# Patient Record
Sex: Female | Born: 1970 | Race: White | Hispanic: No | Marital: Married | Smoking: Never smoker
Health system: Southern US, Community
[De-identification: ages and names within clinical notes are randomized; demographics above are authoritative.]

## PROBLEM LIST (undated history)

## (undated) DIAGNOSIS — E559 Vitamin D deficiency, unspecified: Secondary | ICD-10-CM

## (undated) DIAGNOSIS — I471 Supraventricular tachycardia, unspecified: Secondary | ICD-10-CM

## (undated) DIAGNOSIS — F32A Depression, unspecified: Secondary | ICD-10-CM

## (undated) DIAGNOSIS — K589 Irritable bowel syndrome without diarrhea: Secondary | ICD-10-CM

## (undated) DIAGNOSIS — J45909 Unspecified asthma, uncomplicated: Secondary | ICD-10-CM

## (undated) DIAGNOSIS — R7303 Prediabetes: Secondary | ICD-10-CM

## (undated) DIAGNOSIS — G4730 Sleep apnea, unspecified: Secondary | ICD-10-CM

## (undated) DIAGNOSIS — F419 Anxiety disorder, unspecified: Secondary | ICD-10-CM

## (undated) DIAGNOSIS — G4700 Insomnia, unspecified: Secondary | ICD-10-CM

## (undated) DIAGNOSIS — F329 Major depressive disorder, single episode, unspecified: Secondary | ICD-10-CM

## (undated) DIAGNOSIS — Z8669 Personal history of other diseases of the nervous system and sense organs: Secondary | ICD-10-CM

## (undated) HISTORY — DX: Major depressive disorder, single episode, unspecified: F32.9

## (undated) HISTORY — DX: Sleep apnea, unspecified: G47.30

## (undated) HISTORY — DX: Supraventricular tachycardia, unspecified: I47.10

## (undated) HISTORY — DX: Unspecified asthma, uncomplicated: J45.909

## (undated) HISTORY — DX: Prediabetes: R73.03

## (undated) HISTORY — DX: Irritable bowel syndrome, unspecified: K58.9

## (undated) HISTORY — DX: Vitamin D deficiency, unspecified: E55.9

## (undated) HISTORY — DX: Personal history of other diseases of the nervous system and sense organs: Z86.69

## (undated) HISTORY — PX: RADIOFREQUENCY ABLATION NERVES: SUR1070

## (undated) HISTORY — DX: Anxiety disorder, unspecified: F41.9

## (undated) HISTORY — DX: Supraventricular tachycardia: I47.1

## (undated) HISTORY — DX: Depression, unspecified: F32.A

## (undated) HISTORY — DX: Insomnia, unspecified: G47.00

---

## 2017-04-01 ENCOUNTER — Encounter: Payer: Self-pay | Admitting: Physician Assistant

## 2017-04-01 ENCOUNTER — Ambulatory Visit (INDEPENDENT_AMBULATORY_CARE_PROVIDER_SITE_OTHER): Admitting: Physician Assistant

## 2017-04-01 DIAGNOSIS — F411 Generalized anxiety disorder: Secondary | ICD-10-CM | POA: Diagnosis not present

## 2017-04-01 DIAGNOSIS — F320 Major depressive disorder, single episode, mild: Secondary | ICD-10-CM | POA: Diagnosis not present

## 2017-04-01 DIAGNOSIS — I471 Supraventricular tachycardia: Secondary | ICD-10-CM | POA: Diagnosis not present

## 2017-04-01 LAB — T4, FREE: Free T4: 0.72 ng/dL (ref 0.60–1.60)

## 2017-04-01 LAB — TSH: TSH: 1.89 u[IU]/mL (ref 0.35–4.50)

## 2017-04-01 MED ORDER — VORTIOXETINE HBR 20 MG PO TABS: 20.0000 mg | ORAL_TABLET | Freq: Every day | ORAL | 3 refills | Status: DC

## 2017-04-01 MED ORDER — CYCLOBENZAPRINE HCL 5 MG PO TABS: 5.0000 mg | ORAL_TABLET | Freq: Every evening | ORAL | 1 refills | Status: DC | PRN

## 2017-04-01 NOTE — Patient Instructions (Signed)
Please use the voucher given to get the Trintellix.  If you have any issue getting coverage, let me know so I can work on this.   The Flexeril is for rare use at night when your muscle spasms/tnesion flare up. If there is any increase in frequency or severity of this, please let me know.   Continue other medications as directed. Follow-up with therapist as scheduled on Monday. You will be contacted by Cardiology for a new patient appointment.   I will call you with lab results and we will discuss follow-up. Otherwise plan on following up in 3-4 weeks.

## 2017-04-01 NOTE — Progress Notes (Signed)
Patient presents to clinic today to establish care.  Chronic Issues: Depression and Anxiety -- Long-standing history, present since age 46 after the death of her brother. Has had three major episodes of significant depression. Is maintained on Abilify and Cymbalta. Endorses well-controlled overall until recently. Notes shift in mood since moving to the area. Is having breakthrough depression and anxiety. Has been researching Trintellix and would like to discuss starting medication. Has previously been on Lexapro, Wellbutrin, Pristiq and Zoloft without improvement in symptoms..  History of SVT -- Diagnosed in 2016 per patient. Was followed by Cardiology prior to moving. Is requesting a specialist in the area. Is currently on Bystolic 5 mg daily. Patient denies chest pain, palpitations, lightheadedness, dizziness, vision changes or frequent headaches.  BP Readings from Last 3 Encounters:  04/01/17 108/70   Migraines -- Frequent until nerve ablation a few years prior. Denies recurrence of migraines since that time. Does occasionally get tension in the posterior neck for which she uses a muscle relaxant on a rare basis.   Health Maintenance: Immunizations -- obtaining records for review. Mammogram -- up-to-date per patient. Will need records.   Past Medical History:  Diagnosis Date  . Anxiety   . Asthma   . Depression   . History of migraine    No issue in 2 years  . SVT (supraventricular tachycardia) (HCC)     Past Surgical History:  Procedure Laterality Date  . RADIOFREQUENCY ABLATION NERVES      No current outpatient prescriptions on file prior to visit.   No current facility-administered medications on file prior to visit.     Allergies  Allergen Reactions  . Penicillins Nausea And Vomiting  . Sulfa Antibiotics Nausea And Vomiting    Family History  Problem Relation Age of Onset  . Hypertension Mother   . Hyperlipidemia Mother   . Suicidality Brother   . Angina  Paternal Grandmother     Social History   Social History  . Marital status: Married    Spouse name: N/A  . Number of children: N/A  . Years of education: N/A   Occupational History  . Not on file.   Social History Main Topics  . Smoking status: Never Smoker  . Smokeless tobacco: Never Used  . Alcohol use No  . Drug use: No  . Sexual activity: Yes   Other Topics Concern  . Not on file   Social History Narrative  . No narrative on file    Review of Systems  Constitutional: Negative for fever and weight loss.  HENT: Negative for ear discharge, ear pain, hearing loss and tinnitus.   Eyes: Negative for blurred vision, double vision, photophobia and pain.  Respiratory: Negative for cough and shortness of breath.   Cardiovascular: Negative for chest pain and palpitations.  Gastrointestinal: Negative for abdominal pain, blood in stool, constipation, diarrhea, heartburn, melena, nausea and vomiting.  Genitourinary: Negative for dysuria, flank pain, frequency, hematuria and urgency.  Musculoskeletal: Positive for neck pain. Negative for falls.  Neurological: Negative for dizziness, loss of consciousness and headaches.  Endo/Heme/Allergies: Negative for environmental allergies.  Psychiatric/Behavioral: Positive for depression. Negative for hallucinations, substance abuse and suicidal ideas. The patient is nervous/anxious. The patient does not have insomnia.    BP 108/70   Pulse 76   Temp 98.5 F (36.9 C) (Oral)   Resp 14   Ht 5\' 5"  (1.651 m)   Wt 183 lb (83 kg)   SpO2 98%   BMI 30.45 kg/m  Physical Exam  Constitutional: She is oriented to person, place, and time and well-developed, well-nourished, and in no distress.  HENT:  Head: Normocephalic and atraumatic.  Right Ear: External ear normal.  Left Ear: External ear normal.  Nose: Nose normal.  Mouth/Throat: Oropharynx is clear and moist. No oropharyngeal exudate.  TM within normal limits bilaterally.  Eyes:  Conjunctivae are normal.  Neck: Neck supple. No thyromegaly present.  Cardiovascular: Normal rate, regular rhythm, normal heart sounds and intact distal pulses.   Pulmonary/Chest: Effort normal and breath sounds normal. No respiratory distress. She has no wheezes. She has no rales. She exhibits no tenderness.  Lymphadenopathy:    She has no cervical adenopathy.  Neurological: She is alert and oriented to person, place, and time.  Skin: Skin is warm and dry. No rash noted.  Psychiatric: Affect normal.  Vitals reviewed.  Recent Results (from the past 2160 hour(s))  TSH     Status: None   Collection Time: 04/01/17  2:29 PM  Result Value Ref Range   TSH 1.89 0.35 - 4.50 uIU/mL  T4, free     Status: None   Collection Time: 04/01/17  2:29 PM  Result Value Ref Range   Free T4 0.72 0.60 - 1.60 ng/dL    Comment: Specimens from patients who are undergoing biotin therapy and /or ingesting biotin supplements may contain high levels of biotin.  The higher biotin concentration in these specimens interferes with this Free T4 assay.  Specimens that contain high levels  of biotin may cause false high results for this Free T4 assay.  Please interpret results in light of the total clinical presentation of the patient.     Assessment/Plan: SVT (supraventricular tachycardia) (HCC) Vitals stable. Asymptomatic. Continue Bystolic. Referral to Cardiology placed.  Generalized anxiety disorder Will cross-taper down on Cymbalta while tapering up on Trintellix. Will start with decrease in cymbalta to 60 daily while starting 10 of Trintellix. Will assess patient via phone in 1 week to taper further. Follow-up 3-4 weeks. Alarms signs/symptoms reviewed with patient that would prompt ER assessment.   Depression, major, single episode, mild (HCC) Labs today. Will cross-taper down on Cymbalta while tapering up on Trintellix. Will start with decrease in cymbalta to 60 daily while starting 10 of Trintellix. Will assess  patient via phone in 1 week to taper further. Follow-up 3-4 weeks. Alarms signs/symptoms reviewed with patient that would prompt ER assessment.      Piedad ClimesMartin, Emauri Krygier Cody, PA-C

## 2017-04-01 NOTE — Progress Notes (Signed)
Pre visit review using our clinic review tool, if applicable. No additional management support is needed unless otherwise documented below in the visit note. 

## 2017-04-05 NOTE — Assessment & Plan Note (Signed)
Vitals stable. Asymptomatic. Continue Bystolic. Referral to Cardiology placed.

## 2017-04-05 NOTE — Assessment & Plan Note (Signed)
Will cross-taper down on Cymbalta while tapering up on Trintellix. Will start with decrease in cymbalta to 60 daily while starting 10 of Trintellix. Will assess patient via phone in 1 week to taper further. Follow-up 3-4 weeks. Alarms signs/symptoms reviewed with patient that would prompt ER assessment.

## 2017-04-05 NOTE — Assessment & Plan Note (Addendum)
Labs today. Will cross-taper down on Cymbalta while tapering up on Trintellix. Will start with decrease in cymbalta to 60 daily while starting 10 of Trintellix. Will assess patient via phone in 1 week to taper further. Follow-up 3-4 weeks. Alarms signs/symptoms reviewed with patient that would prompt ER assessment.

## 2017-04-08 ENCOUNTER — Telehealth: Payer: Self-pay | Admitting: Physician Assistant

## 2017-04-08 ENCOUNTER — Other Ambulatory Visit: Payer: Self-pay | Admitting: Physician Assistant

## 2017-04-08 MED ORDER — MONTELUKAST SODIUM 10 MG PO TABS: 10.0000 mg | ORAL_TABLET | Freq: Every day | ORAL | 0 refills | Status: DC

## 2017-04-08 MED ORDER — VORTIOXETINE HBR 20 MG PO TABS: 20.0000 mg | ORAL_TABLET | Freq: Every day | ORAL | 1 refills | Status: DC

## 2017-04-08 NOTE — Telephone Encounter (Signed)
Pt asking if KT would call in singlar for her, pt states that her and Selena BattenCody had discussed that if she was not feeling better that he would call this in for her. [facility] in [place]

## 2017-04-08 NOTE — Telephone Encounter (Signed)
Medication filled to pharmacy as requested.  Pt informed.   

## 2017-04-08 NOTE — Telephone Encounter (Signed)
Ok to send Singulair 10mg  daily, #30, no refills

## 2017-04-18 ENCOUNTER — Telehealth: Payer: Self-pay | Admitting: Physician Assistant

## 2017-04-18 ENCOUNTER — Other Ambulatory Visit: Payer: Self-pay | Admitting: Emergency Medicine

## 2017-04-18 MED ORDER — MONTELUKAST SODIUM 10 MG PO TABS: 10.0000 mg | ORAL_TABLET | Freq: Every day | ORAL | 0 refills | Status: DC

## 2017-04-18 NOTE — Telephone Encounter (Signed)
Pt asking for a refill on singulair, pt states that this is working well for her and does not want to run out before next appt with Sanbornody. [facility] in [place]

## 2017-04-18 NOTE — Telephone Encounter (Signed)
Pt called back and states that [facility] is not covered with insurance for refills and asking if [facility]

## 2017-04-18 NOTE — Telephone Encounter (Signed)
[facility] until next appointment. Rx sent on 04/08/17

## 2017-04-18 NOTE — Telephone Encounter (Signed)
Rx for [facility].

## 2017-04-26 ENCOUNTER — Ambulatory Visit: Admitting: Physician Assistant

## 2017-05-02 ENCOUNTER — Encounter: Payer: Self-pay | Admitting: Physician Assistant

## 2017-05-02 ENCOUNTER — Ambulatory Visit (INDEPENDENT_AMBULATORY_CARE_PROVIDER_SITE_OTHER): Admitting: Physician Assistant

## 2017-05-02 DIAGNOSIS — F320 Major depressive disorder, single episode, mild: Secondary | ICD-10-CM | POA: Diagnosis not present

## 2017-05-02 MED ORDER — BUPROPION HCL ER (XL) 150 MG PO TB24: 150.0000 mg | ORAL_TABLET | Freq: Every day | ORAL | 1 refills | Status: DC

## 2017-05-02 MED ORDER — VORTIOXETINE HBR 10 MG PO TABS: 10.0000 mg | ORAL_TABLET | Freq: Every day | ORAL | 1 refills | Status: DC

## 2017-05-02 MED ORDER — VORTIOXETINE HBR 20 MG PO TABS: 20.0000 mg | ORAL_TABLET | Freq: Every day | ORAL | 1 refills | Status: DC

## 2017-05-02 MED ORDER — VORTIOXETINE HBR 20 MG PO TABS: 10.0000 mg | ORAL_TABLET | Freq: Every day | ORAL | 1 refills | Status: DC

## 2017-05-02 NOTE — Progress Notes (Signed)
Patient presents to clinic today for follow-up of anxiety and depression after changing Cymbalta for Trintellix. Has continued 2 mg of Abilify daily. Endorses doing very well overall with this change. Notes improvement in mood. Has noted increased appetite with weight gain despite following a ketogenic diet and exercise regimen. Thyroid testing within normal limits at last visit. Would like to discuss addition of Wellbutrin or Contrave for weight loss.  Past Medical History:  Diagnosis Date  . Anxiety   . Asthma   . Depression   . History of migraine    No issue in 2 years  . SVT (supraventricular tachycardia) (HCC)     Current Outpatient Prescriptions on File Prior to Visit  Medication Sig Dispense Refill  . ARIPiprazole (ABILIFY) 2 MG tablet Take 2 mg by mouth daily.    . cyclobenzaprine (FLEXERIL) 5 MG tablet Take 1 tablet (5 mg total) by mouth at bedtime as needed for muscle spasms. 30 tablet 1  . diazepam (VALIUM) 5 MG tablet Take 5 mg by mouth every 12 (twelve) hours as needed for anxiety.    . nebivolol (BYSTOLIC) 5 MG tablet Take 5 mg by mouth daily.    . montelukast (SINGULAIR) 10 MG tablet Take 1 tablet (10 mg total) by mouth at bedtime. (Patient not taking: Reported on 05/02/2017) 90 tablet 0   No current facility-administered medications on file prior to visit.     Allergies  Allergen Reactions  . Penicillins Nausea And Vomiting  . Sulfa Antibiotics Nausea And Vomiting    Family History  Problem Relation Age of Onset  . Hypertension Mother   . Hyperlipidemia Mother   . Suicidality Brother   . Angina Paternal Grandmother     Social History   Social History  . Marital status: Married    Spouse name: N/A  . Number of children: N/A  . Years of education: N/A   Social History Main Topics  . Smoking status: Never Smoker  . Smokeless tobacco: Never Used  . Alcohol use No  . Drug use: No  . Sexual activity: Yes   Other Topics Concern  . None   Social  History Narrative  . None    Review of Systems - See HPI.  All other ROS are negative.  BP 110/70   Pulse 67   Temp 98.2 F (36.8 C) (Oral)   Resp 14   Ht 5\' 5"  (1.651 m)   Wt 184 lb (83.5 kg)   SpO2 98%   BMI 30.62 kg/m   Physical Exam  Constitutional: She is oriented to person, place, and time and well-developed, well-nourished, and in no distress.  HENT:  Head: Normocephalic and atraumatic.  Eyes: Conjunctivae are normal.  Neck: Neck supple.  Cardiovascular: Normal rate, regular rhythm, normal heart sounds and intact distal pulses.   Pulmonary/Chest: Effort normal and breath sounds normal. No respiratory distress. She has no wheezes. She has no rales. She exhibits no tenderness.  Neurological: She is alert and oriented to person, place, and time.  Skin: Skin is warm and dry. No rash noted.  Psychiatric: Affect normal.  Vitals reviewed.   Recent Results (from the past 2160 hour(s))  TSH     Status: None   Collection Time: 04/01/17  2:29 PM  Result Value Ref Range   TSH 1.89 0.35 - 4.50 uIU/mL  T4, free     Status: None   Collection Time: 04/01/17  2:29 PM  Result Value Ref Range   Free T4  0.72 0.60 - 1.60 ng/dL    Comment: Specimens from patients who are undergoing biotin therapy and /or ingesting biotin supplements may contain high levels of biotin.  The higher biotin concentration in these specimens interferes with this Free T4 assay.  Specimens that contain high levels  of biotin may cause false high results for this Free T4 assay.  Please interpret results in light of the total clinical presentation of the patient.      Assessment/Plan: Depression, major, single episode, mild (HCC) Will continue Abilify at current dose. Symptoms significantly improved with change to Trintellix. Will add on Wellbutrin XL at 150 mg to help with weight and further help with mood. Will decrease Trintellix to 10 mg since adding this. Continue diet and exercise. Follow-up 1  month.    Piedad Climes, PA-C

## 2017-05-02 NOTE — Patient Instructions (Signed)
Please start Wellbutrin as directed. Decrease the Trintellix to 1/2 tablet (10 mg). I will send in an Rx of the new dose.  Continue Abilify.  Follow-up with me in 3 weeks for repeat assessment. If you note any worsening mood, sweating, racing heart, stop the Wellbutrin and come see me.

## 2017-05-02 NOTE — Progress Notes (Signed)
Pre visit review using our clinic review tool, if applicable. No additional management support is needed unless otherwise documented below in the visit note. 

## 2017-05-02 NOTE — Assessment & Plan Note (Signed)
Will continue Abilify at current dose. Symptoms significantly improved with change to Trintellix. Will add on Wellbutrin XL at 150 mg to help with weight and further help with mood. Will decrease Trintellix to 10 mg since adding this. Continue diet and exercise. Follow-up 1 month.

## 2017-05-10 ENCOUNTER — Encounter: Payer: Self-pay | Admitting: Physician Assistant

## 2017-05-10 ENCOUNTER — Ambulatory Visit (INDEPENDENT_AMBULATORY_CARE_PROVIDER_SITE_OTHER): Admitting: Physician Assistant

## 2017-05-10 ENCOUNTER — Telehealth: Payer: Self-pay | Admitting: Physician Assistant

## 2017-05-10 VITALS — BP 90/60 | HR 69 | Temp 98.3°F | Resp 14 | Ht 65.0 in | Wt 184.0 lb

## 2017-05-10 DIAGNOSIS — F320 Major depressive disorder, single episode, mild: Secondary | ICD-10-CM | POA: Diagnosis not present

## 2017-05-10 NOTE — Progress Notes (Signed)
Patient presents to clinic today for follow-up of depression. At last visit, trintellix was decreased from 20 mg to 10 mg daily due to side effect. Wellbutrin 150 mg daily was started. Patient is taking as directed. Is tolerating well. Endorses increased appetite and hunger along with nausea have all revolved. Would like to discuss increase in Wellbutrin since tolerating well to help further with mood. States the mood is still not where it was with the 20 mg Trintellix. Denies SI/HI.  Past Medical History:  Diagnosis Date  . Anxiety   . Asthma   . Depression   . History of migraine    No issue in 2 years  . SVT (supraventricular tachycardia) (HCC)     Current Outpatient Prescriptions on File Prior to Visit  Medication Sig Dispense Refill  . ARIPiprazole (ABILIFY) 2 MG tablet Take 2 mg by mouth daily.    Marland Kitchen buPROPion (WELLBUTRIN XL) 150 MG 24 hr tablet Take 1 tablet (150 mg total) by mouth daily. 30 tablet 1  . cyclobenzaprine (FLEXERIL) 5 MG tablet Take 1 tablet (5 mg total) by mouth at bedtime as needed for muscle spasms. 30 tablet 1  . diazepam (VALIUM) 5 MG tablet Take 5 mg by mouth every 12 (twelve) hours as needed for anxiety.    . montelukast (SINGULAIR) 10 MG tablet Take 1 tablet (10 mg total) by mouth at bedtime. 90 tablet 0  . nebivolol (BYSTOLIC) 5 MG tablet Take 5 mg by mouth daily.    Marland Kitchen vortioxetine HBr (TRINTELLIX) 10 MG TABS Take 1 tablet (10 mg total) by mouth daily. 90 tablet 1  . Melatonin 3 MG TABS Take 1 tablet by mouth at bedtime.     No current facility-administered medications on file prior to visit.     Allergies  Allergen Reactions  . Penicillins Nausea And Vomiting  . Sulfa Antibiotics Nausea And Vomiting    Family History  Problem Relation Age of Onset  . Hypertension Mother   . Hyperlipidemia Mother   . Suicidality Brother   . Angina Paternal Grandmother     Social History   Social History  . Marital status: Married    Spouse name: N/A  .  Number of children: N/A  . Years of education: N/A   Social History Main Topics  . Smoking status: Never Smoker  . Smokeless tobacco: Never Used  . Alcohol use No  . Drug use: No  . Sexual activity: Yes   Other Topics Concern  . None   Social History Narrative  . None    Review of Systems - See HPI.  All other ROS are negative.  BP 90/60   Pulse 69   Temp 98.3 F (36.8 C) (Oral)   Resp 14   Ht 5\' 5"  (1.651 m)   Wt 184 lb (83.5 kg)   SpO2 98%   BMI 30.62 kg/m   Physical Exam  Constitutional: She is oriented to person, place, and time and well-developed, well-nourished, and in no distress.  HENT:  Head: Normocephalic and atraumatic.  Eyes: Conjunctivae are normal.  Neck: Neck supple.  Cardiovascular: Normal rate, regular rhythm, normal heart sounds and intact distal pulses.   Pulmonary/Chest: Effort normal and breath sounds normal. No respiratory distress. She has no wheezes. She has no rales. She exhibits no tenderness.  Neurological: She is alert and oriented to person, place, and time. No cranial nerve deficit.  Skin: Skin is warm and dry. No rash noted.  Psychiatric: Affect normal.  Vitals reviewed.   Recent Results (from the past 2160 hour(s))  TSH     Status: None   Collection Time: 04/01/17  2:29 PM  Result Value Ref Range   TSH 1.89 0.35 - 4.50 uIU/mL  T4, free     Status: None   Collection Time: 04/01/17  2:29 PM  Result Value Ref Range   Free T4 0.72 0.60 - 1.60 ng/dL    Comment: Specimens from patients who are undergoing biotin therapy and /or ingesting biotin supplements may contain high levels of biotin.  The higher biotin concentration in these specimens interferes with this Free T4 assay.  Specimens that contain high levels  of biotin may cause false high results for this Free T4 assay.  Please interpret results in light of the total clinical presentation of the patient.      Assessment/Plan: Depression, major, single episode, mild  (HCC) Continue 10 mg Trintellix. Will increase Wellbutrin to 300 mg once daily as she is tolerating well. Close follow-up scheduled. ER precautions reviewed. Patient voiced understanding and agreement with the plan.     Piedad ClimesMartin, Valeri Sula Cody, PA-C

## 2017-05-10 NOTE — Patient Instructions (Signed)
Please continue Trintellix at 10 mg dose. Increase Wellbutrin to 300 mg once daily. We will follow-up via phone in 1 week. Follow-up in office as scheduled.  If you note any worsening mood on this regimen, come see me.  Any alarm symptoms, thoughts of harming yourself or others, etc, please go to the ER.

## 2017-05-10 NOTE — Telephone Encounter (Signed)
Called pt and advised of PCP recommendations, she made an appt for tomorrow 05/11/17.

## 2017-05-10 NOTE — Assessment & Plan Note (Signed)
Continue 10 mg Trintellix. Will increase Wellbutrin to 300 mg once daily as she is tolerating well. Close follow-up scheduled. ER precautions reviewed. Patient voiced understanding and agreement with the plan.

## 2017-05-10 NOTE — Telephone Encounter (Signed)
Pt asking if she could increase her wellbutrin from 150 mg to 300 mg, please advise

## 2017-05-10 NOTE — Telephone Encounter (Signed)
Would stay at same dose until follow-up appointment. Any worsening of mood, she needs to be seen sooner.

## 2017-05-10 NOTE — Telephone Encounter (Signed)
Please advise? pt was last seen on 05/02/17 for depression.

## 2017-05-10 NOTE — Progress Notes (Signed)
Pre visit review using our clinic review tool, if applicable. No additional management support is needed unless otherwise documented below in the visit note. 

## 2017-05-11 ENCOUNTER — Ambulatory Visit: Admitting: Physician Assistant

## 2017-05-23 ENCOUNTER — Ambulatory Visit (INDEPENDENT_AMBULATORY_CARE_PROVIDER_SITE_OTHER): Admitting: Physician Assistant

## 2017-05-23 ENCOUNTER — Encounter: Payer: Self-pay | Admitting: Physician Assistant

## 2017-05-23 VITALS — BP 100/70 | HR 65 | Temp 98.8°F | Resp 14 | Ht 65.0 in | Wt 185.0 lb

## 2017-05-23 DIAGNOSIS — J3489 Other specified disorders of nose and nasal sinuses: Secondary | ICD-10-CM | POA: Diagnosis not present

## 2017-05-23 DIAGNOSIS — F320 Major depressive disorder, single episode, mild: Secondary | ICD-10-CM

## 2017-05-23 MED ORDER — ARIPIPRAZOLE 2 MG PO TABS: 2.0000 mg | ORAL_TABLET | Freq: Every day | ORAL | 1 refills | Status: DC

## 2017-05-23 MED ORDER — DULOXETINE HCL 60 MG PO CPEP: 60.0000 mg | ORAL_CAPSULE | Freq: Every day | ORAL | 1 refills | Status: DC

## 2017-05-23 MED ORDER — MUPIROCIN 2 % EX OINT: 1.0000 "application " | TOPICAL_OINTMENT | Freq: Two times a day (BID) | CUTANEOUS | 0 refills | Status: DC

## 2017-05-23 MED ORDER — BUPROPION HCL ER (XL) 150 MG PO TB24: 150.0000 mg | ORAL_TABLET | Freq: Every day | ORAL | 1 refills | Status: DC

## 2017-05-23 NOTE — Assessment & Plan Note (Signed)
Discussed avoidance of changing medications without consulting a medical professional. Will stay on current regimen of Abilify, Cymbalta and Wellbutrin. Follow-up scheduled.

## 2017-05-23 NOTE — Progress Notes (Signed)
Patient presents to clinic today for follow-up of anxiety/depression. At last visit patient was kept on 10 mg Trintellix daily and Wellbutrin increased to 300 mg daily. Patient states she took as directed for one week but noted still having significant issue with hunger on the Trintellix. As such she stopped the Trintellix and restarted Cymbalta at 60 mg. Decreased Wellbutrin to 150 mg. Has been taking as directed with resolution in extreme hunger. Notes mood is stable overall and feeling much better. Is working harder on diet and exercise. Notes resting well. Denies SI/HI.  Patient also endorses sore in R nostril over the past couple of weeks. Has a history of this for which she has used Mupirocin ointment with success. Denies epistaxis. Only noted mild tenderness.   Past Medical History:  Diagnosis Date  . Anxiety   . Asthma   . Depression   . History of migraine    No issue in 2 years  . SVT (supraventricular tachycardia) (HCC)     Current Outpatient Prescriptions on File Prior to Visit  Medication Sig Dispense Refill  . cyclobenzaprine (FLEXERIL) 5 MG tablet Take 1 tablet (5 mg total) by mouth at bedtime as needed for muscle spasms. 30 tablet 1  . diazepam (VALIUM) 5 MG tablet Take 5 mg by mouth every 12 (twelve) hours as needed for anxiety.    . nebivolol (BYSTOLIC) 5 MG tablet Take 5 mg by mouth daily.     No current facility-administered medications on file prior to visit.     Allergies  Allergen Reactions  . Penicillins Nausea And Vomiting  . Sulfa Antibiotics Nausea And Vomiting    Family History  Problem Relation Age of Onset  . Hypertension Mother   . Hyperlipidemia Mother   . Suicidality Brother   . Angina Paternal Grandmother     Social History   Social History  . Marital status: Married    Spouse name: N/A  . Number of children: N/A  . Years of education: N/A   Social History Main Topics  . Smoking status: Never Smoker  . Smokeless tobacco: Never Used    . Alcohol use No  . Drug use: No  . Sexual activity: Yes   Other Topics Concern  . None   Social History Narrative  . None   Review of Systems - See HPI.  All other ROS are negative.  BP 100/70   Pulse 65   Temp 98.8 F (37.1 C) (Oral)   Resp 14   Ht 5\' 5"  (1.651 m)   Wt 185 lb (83.9 kg)   SpO2 98%   BMI 30.79 kg/m   Physical Exam  Constitutional: She is oriented to person, place, and time and well-developed, well-nourished, and in no distress.  HENT:  Head: Normocephalic and atraumatic.  Nose:    Eyes: Conjunctivae are normal.  Neck: Neck supple.  Cardiovascular: Normal rate, regular rhythm, normal heart sounds and intact distal pulses.   Pulmonary/Chest: Effort normal and breath sounds normal. No respiratory distress. She has no wheezes. She has no rales. She exhibits no tenderness.  Neurological: She is alert and oriented to person, place, and time.  Skin: Skin is warm and dry. No rash noted.  Psychiatric: Affect normal.  Vitals reviewed.   Recent Results (from the past 2160 hour(s))  TSH     Status: None   Collection Time: 04/01/17  2:29 PM  Result Value Ref Range   TSH 1.89 0.35 - 4.50 uIU/mL  T4, free  Status: None   Collection Time: 04/01/17  2:29 PM  Result Value Ref Range   Free T4 0.72 0.60 - 1.60 ng/dL    Comment: Specimens from patients who are undergoing biotin therapy and /or ingesting biotin supplements may contain high levels of biotin.  The higher biotin concentration in these specimens interferes with this Free T4 assay.  Specimens that contain high levels  of biotin may cause false high results for this Free T4 assay.  Please interpret results in light of the total clinical presentation of the patient.    Dictation #1 [id number]  [id number]  Assessment/Plan: Nasal vestibulitis Rx mupirocin. Supportive measures and OTC medications discussed.  Depression, major, single episode, mild (HCC) Discussed avoidance of changing  medications without consulting a medical professional. Will stay on current regimen of Abilify, Cymbalta and Wellbutrin. Follow-up scheduled.     Piedad ClimesMartin, Mercia Dowe Cody, PA-C

## 2017-05-23 NOTE — Progress Notes (Signed)
Pre visit review using our clinic review tool, if applicable. No additional management support is needed unless otherwise documented below in the visit note. 

## 2017-05-23 NOTE — Patient Instructions (Signed)
Please continue chronic medications as directed. Keep up with diet and exercise.  Start the Mupirocin ointment as directed for 1 week.  Continue with vaseline. Place a humidifier in the bedroom.  Call if symptoms are not resolving. Follow-up with me in 3 months.

## 2017-05-23 NOTE — Assessment & Plan Note (Signed)
Rx mupirocin. Supportive measures and OTC medications discussed.

## 2017-05-24 ENCOUNTER — Encounter: Payer: Self-pay | Admitting: Physician Assistant

## 2017-05-24 ENCOUNTER — Ambulatory Visit (INDEPENDENT_AMBULATORY_CARE_PROVIDER_SITE_OTHER): Admitting: Physician Assistant

## 2017-05-24 DIAGNOSIS — F320 Major depressive disorder, single episode, mild: Secondary | ICD-10-CM | POA: Diagnosis not present

## 2017-05-24 MED ORDER — VILAZODONE HCL 10 MG PO TABS: 10.0000 mg | ORAL_TABLET | Freq: Every day | ORAL | 1 refills | Status: DC

## 2017-05-24 NOTE — Patient Instructions (Signed)
Please stop the Cymbalta and start the Vilazodone (Viibryd) as directed. Continue the Abilify and Wellbutrin. I would like to see you in 2 weeks. Consider the GeneSite testing. I feel this would be highly helpful. If you note any worsening mood with medication changes, please call or return to the office.

## 2017-05-24 NOTE — Assessment & Plan Note (Addendum)
Patient apologizes for withholding things at last visit. I discussed with her that it will be very hard for me to take care of her if she is not comfortable opening up and being honest about symptoms. She voices understanding. Discussed options. Will continue Abilify and Wellbutrin. Cymbalta again d/c'd.  Will add on low-dose Viibryd. Very close follow-up scheduled.

## 2017-05-24 NOTE — Progress Notes (Signed)
Pre visit review using our clinic review tool, if applicable. No additional management support is needed unless otherwise documented below in the visit note. 

## 2017-05-24 NOTE — Progress Notes (Signed)
Patient presents to clinic today c/o for follow-up of depressed mood. Patient was just seen for this issue yesterday at which time she noted doing very well on regimen of Cymbalta, Abilify and Wellbutrin. States that she really was not feeling much better but was hesitant to try other medications due to her history of side effects and ineffectiveness. Would now like to discuss. Denies SI/HI.   Past Medical History:  Diagnosis Date  . Anxiety   . Asthma   . Depression   . History of migraine    No issue in 2 years  . SVT (supraventricular tachycardia) (HCC)     Current Outpatient Prescriptions on File Prior to Visit  Medication Sig Dispense Refill  . ARIPiprazole (ABILIFY) 2 MG tablet Take 1 tablet (2 mg total) by mouth daily. 90 tablet 1  . buPROPion (WELLBUTRIN XL) 150 MG 24 hr tablet Take 1 tablet (150 mg total) by mouth daily. 90 tablet 1  . cyclobenzaprine (FLEXERIL) 5 MG tablet Take 1 tablet (5 mg total) by mouth at bedtime as needed for muscle spasms. 30 tablet 1  . diazepam (VALIUM) 5 MG tablet Take 5 mg by mouth every 12 (twelve) hours as needed for anxiety.    . mupirocin ointment (BACTROBAN) 2 % Place 1 application into the nose 2 (two) times daily. 22 g 0  . nebivolol (BYSTOLIC) 5 MG tablet Take 5 mg by mouth daily.     No current facility-administered medications on file prior to visit.     Allergies  Allergen Reactions  . Penicillins Nausea And Vomiting  . Sulfa Antibiotics Nausea And Vomiting    Family History  Problem Relation Age of Onset  . Hypertension Mother   . Hyperlipidemia Mother   . Suicidality Brother   . Angina Paternal Grandmother     Social History   Social History  . Marital status: Married    Spouse name: N/A  . Number of children: N/A  . Years of education: N/A   Social History Main Topics  . Smoking status: Never Smoker  . Smokeless tobacco: Never Used  . Alcohol use No  . Drug use: No  . Sexual activity: Yes   Other Topics  Concern  . None   Social History Narrative  . None   Review of Systems - See HPI.  All other ROS are negative.  BP 100/62   Pulse 65   Temp 99.2 F (37.3 C) (Oral)   Resp 14   Ht 5\' 5"  (1.651 m)   Wt 184 lb (83.5 kg)   SpO2 98%   BMI 30.62 kg/m   Physical Exam  Constitutional: She is oriented to person, place, and time and well-developed, well-nourished, and in no distress.  HENT:  Head: Normocephalic and atraumatic.  Eyes: Conjunctivae are normal.  Neck: Neck supple.  Cardiovascular: Normal rate, regular rhythm, normal heart sounds and intact distal pulses.   Pulmonary/Chest: Effort normal and breath sounds normal. No respiratory distress. She has no wheezes. She has no rales. She exhibits no tenderness.  Neurological: She is alert and oriented to person, place, and time.  Skin: Skin is warm and dry. No rash noted.  Psychiatric: Affect normal.  Vitals reviewed.   Recent Results (from the past 2160 hour(s))  TSH     Status: None   Collection Time: 04/01/17  2:29 PM  Result Value Ref Range   TSH 1.89 0.35 - 4.50 uIU/mL  T4, free     Status: None  Collection Time: 04/01/17  2:29 PM  Result Value Ref Range   Free T4 0.72 0.60 - 1.60 ng/dL    Comment: Specimens from patients who are undergoing biotin therapy and /or ingesting biotin supplements may contain high levels of biotin.  The higher biotin concentration in these specimens interferes with this Free T4 assay.  Specimens that contain high levels  of biotin may cause false high results for this Free T4 assay.  Please interpret results in light of the total clinical presentation of the patient.      Assessment/Plan: Depression, major, single episode, mild [facility]) Patient apologizes for withholding things at last visit. I discussed with her that it will be very hard for me to take care of her if she is not comfortable opening up and being honest about symptoms. She voices understanding. Discussed options. Will continue  Abilify and Wellbutrin. Will add on low-dose Viibryd. Very close follow-up scheduled.    Piedad Climes, PA-C

## 2017-06-07 ENCOUNTER — Ambulatory Visit (INDEPENDENT_AMBULATORY_CARE_PROVIDER_SITE_OTHER): Admitting: Physician Assistant

## 2017-06-07 ENCOUNTER — Encounter: Payer: Self-pay | Admitting: Physician Assistant

## 2017-06-07 VITALS — BP 102/63 | HR 78 | Temp 98.1°F | Resp 16 | Ht 65.0 in | Wt 190.5 lb

## 2017-06-07 DIAGNOSIS — F320 Major depressive disorder, single episode, mild: Secondary | ICD-10-CM

## 2017-06-07 MED ORDER — VILAZODONE HCL 40 MG PO TABS: 40.0000 mg | ORAL_TABLET | Freq: Every day | ORAL | 1 refills | Status: DC

## 2017-06-07 NOTE — Assessment & Plan Note (Signed)
Improving. Will increase Viibryd to 40 mg daily. Continue other medications as directed. Follow-up scheduled. Referral to psychiatry placed.

## 2017-06-07 NOTE — Progress Notes (Signed)
Pre visit review using our clinic review tool, if applicable. No additional management support is needed unless otherwise documented below in the visit note. 

## 2017-06-07 NOTE — Patient Instructions (Addendum)
Please continue Wellbutrin and Abilify.  Start the new dose of the Viibryd.  Follow-up in 6-8 weeks.   Let me know if symptoms are not continuing to improve. Keep working on diet and exercise. I am setting you up with Psychiatry for further management if needed.

## 2017-06-07 NOTE — Progress Notes (Signed)
Patient presents to clinic today for close follow-up of anxiety and depression after medication changes. At last visit, Cymbalta was changed to Viibryd. Wellbutrin and Abilify were continued at same doses. Patient has taken viibryd as directed, titrating up to 20 mg daily after 1 week. Is tolerating medication well without side effect. Has started noticing improvement in mood. Is still having bouts of depression and anhedonia. States each day is different. Does note improved energy and sleep with regimen. Denies SI/HI  Past Medical History:  Diagnosis Date  . Anxiety   . Asthma   . Depression   . History of migraine    No issue in 2 years  . SVT (supraventricular tachycardia) (HCC)     Current Outpatient Prescriptions on File Prior to Visit  Medication Sig Dispense Refill  . ARIPiprazole (ABILIFY) 2 MG tablet Take 1 tablet (2 mg total) by mouth daily. 90 tablet 1  . buPROPion (WELLBUTRIN XL) 150 MG 24 hr tablet Take 1 tablet (150 mg total) by mouth daily. 90 tablet 1  . cyclobenzaprine (FLEXERIL) 5 MG tablet Take 1 tablet (5 mg total) by mouth at bedtime as needed for muscle spasms. 30 tablet 1  . diazepam (VALIUM) 5 MG tablet Take 5 mg by mouth every 12 (twelve) hours as needed for anxiety.    . nebivolol (BYSTOLIC) 5 MG tablet Take 5 mg by mouth daily.     No current facility-administered medications on file prior to visit.     Allergies  Allergen Reactions  . Penicillins Nausea And Vomiting  . Sulfa Antibiotics Nausea And Vomiting    Family History  Problem Relation Age of Onset  . Hypertension Mother   . Hyperlipidemia Mother   . Suicidality Brother   . Angina Paternal Grandmother     Social History   Social History  . Marital status: Married    Spouse name: N/A  . Number of children: N/A  . Years of education: N/A   Social History Main Topics  . Smoking status: Never Smoker  . Smokeless tobacco: Never Used  . Alcohol use No  . Drug use: No  . Sexual  activity: Yes   Other Topics Concern  . None   Social History Narrative  . None    Review of Systems - See HPI.  All other ROS are negative.  BP 102/63   Pulse 78   Temp 98.1 F (36.7 C) (Oral)   Resp 16   Ht 5\' 5"  (1.651 m)   Wt 190 lb 8 oz (86.4 kg)   SpO2 98%   BMI 31.70 kg/m   Physical Exam  Constitutional: She is oriented to person, place, and time and well-developed, well-nourished, and in no distress.  HENT:  Head: Normocephalic and atraumatic.  Eyes: Conjunctivae are normal.  Cardiovascular: Normal rate, regular rhythm, normal heart sounds and intact distal pulses.   Pulmonary/Chest: Effort normal and breath sounds normal. No respiratory distress. She has no wheezes. She has no rales. She exhibits no tenderness.  Neurological: She is alert and oriented to person, place, and time.  Skin: Skin is warm and dry. No rash noted.  Psychiatric: Affect normal.  Vitals reviewed.   Recent Results (from the past 2160 hour(s))  TSH     Status: None   Collection Time: 04/01/17  2:29 PM  Result Value Ref Range   TSH 1.89 0.35 - 4.50 uIU/mL  T4, free     Status: None   Collection Time: 04/01/17  2:29  PM  Result Value Ref Range   Free T4 0.72 0.60 - 1.60 ng/dL    Comment: Specimens from patients who are undergoing biotin therapy and /or ingesting biotin supplements may contain high levels of biotin.  The higher biotin concentration in these specimens interferes with this Free T4 assay.  Specimens that contain high levels  of biotin may cause false high results for this Free T4 assay.  Please interpret results in light of the total clinical presentation of the patient.      Assessment/Plan: Depression, major, single episode, mild (HCC) Improving. Will increase Viibryd to 40 mg daily. Continue other medications as directed. Follow-up scheduled. Referral to psychiatry placed.     Piedad Climes, PA-C

## 2017-07-19 ENCOUNTER — Encounter: Payer: Self-pay | Admitting: Physician Assistant

## 2017-07-19 ENCOUNTER — Ambulatory Visit (INDEPENDENT_AMBULATORY_CARE_PROVIDER_SITE_OTHER): Admitting: Physician Assistant

## 2017-07-19 VITALS — BP 108/64 | HR 78 | Temp 98.4°F | Resp 14 | Ht 65.0 in | Wt 181.0 lb

## 2017-07-19 DIAGNOSIS — F320 Major depressive disorder, single episode, mild: Secondary | ICD-10-CM

## 2017-07-19 DIAGNOSIS — F5101 Primary insomnia: Secondary | ICD-10-CM | POA: Diagnosis not present

## 2017-07-19 MED ORDER — SUVOREXANT 10 MG PO TABS: 10.0000 mg | ORAL_TABLET | Freq: Every day | ORAL | 0 refills | Status: DC

## 2017-07-19 NOTE — Assessment & Plan Note (Signed)
Will start trial of belsomra. Sleep Hygiene practices reviewed.

## 2017-07-19 NOTE — Patient Instructions (Signed)
Please continue chronic medications as directed. Start the Belsomra at night using the free trial of medication. If you are tolerating well we will work on getting medication approved by insurance.  I will call you with the genesight results and next steps!

## 2017-07-19 NOTE — Progress Notes (Signed)
   Patient with history of depression, currently on Viibryd 40 mg daily and Abilify 2 mg daily. Is taking as directed with improvement in mood. Still noting depressed mood several days per week. Denies SI/HI. Has been on numerous medications previously without improvement and with side effects. Is wanting to have the Gene Site testing performed today. Patient also dealing with difficulty staying asleep at night. Has been on Ambien CR previously that helped. Has Rx for Valium to use PRN for sleep but does not want to take benzodiazepines.  Past Medical History:  Diagnosis Date  . Anxiety   . Asthma   . Depression   . History of migraine    No issue in 2 years  . SVT (supraventricular tachycardia) (HCC)     Current Outpatient Prescriptions on File Prior to Visit  Medication Sig Dispense Refill  . ARIPiprazole (ABILIFY) 2 MG tablet Take 1 tablet (2 mg total) by mouth daily. 90 tablet 1  . buPROPion (WELLBUTRIN XL) 150 MG 24 hr tablet Take 1 tablet (150 mg total) by mouth daily. 90 tablet 1  . cyclobenzaprine (FLEXERIL) 5 MG tablet Take 1 tablet (5 mg total) by mouth at bedtime as needed for muscle spasms. 30 tablet 1  . diazepam (VALIUM) 5 MG tablet Take 5 mg by mouth every 12 (twelve) hours as needed for anxiety.    . nebivolol (BYSTOLIC) 5 MG tablet Take 5 mg by mouth daily.    . Vilazodone HCl (VIIBRYD) 40 MG TABS Take 1 tablet (40 mg total) by mouth daily. 90 tablet 1   No current facility-administered medications on file prior to visit.     Allergies  Allergen Reactions  . Penicillins Nausea And Vomiting  . Sulfa Antibiotics Nausea And Vomiting    Family History  Problem Relation Age of Onset  . Hypertension Mother   . Hyperlipidemia Mother   . Suicidality Brother   . Angina Paternal Grandmother     Social History   Social History  . Marital status: Married    Spouse name: N/A  . Number of children: N/A  . Years of education: N/A   Social History Main Topics  .  Smoking status: Never Smoker  . Smokeless tobacco: Never Used  . Alcohol use No  . Drug use: No  . Sexual activity: Yes   Other Topics Concern  . None   Social History Narrative  . None   Review of Systems - See HPI.  All other ROS are negative.  BP 108/64   Pulse 78   Temp 98.4 F (36.9 C) (Oral)   Resp 14   Ht 5\' 5"  (1.651 m)   Wt 181 lb (82.1 kg)   SpO2 98%   BMI 30.12 kg/m   Physical Exam  Constitutional: She is oriented to person, place, and time and well-developed, well-nourished, and in no distress.  HENT:  Head: Normocephalic and atraumatic.  Cardiovascular: Normal rate, regular rhythm, normal heart sounds and intact distal pulses.   Pulmonary/Chest: Effort normal.  Neurological: She is alert and oriented to person, place, and time.  Skin: Skin is dry. No rash noted.  Psychiatric: Affect normal.  Vitals reviewed.  Assessment/Plan: Depression, major, single episode, mild (HCC) Will continue current regimen for now. Will obtain swab for Gene Site testing.   Primary insomnia Will start trial of belsomra. Sleep Hygiene practices reviewed.     Piedad ClimesMartin, Valeria Krisko Cody, PA-C

## 2017-07-19 NOTE — Assessment & Plan Note (Signed)
Will continue current regimen for now. Will obtain swab for Gene Site testing.

## 2017-07-19 NOTE — Progress Notes (Signed)
Pre visit review using our clinic review tool, if applicable. No additional management support is needed unless otherwise documented below in the visit note. 

## 2017-07-25 ENCOUNTER — Telehealth: Payer: Self-pay | Admitting: Physician Assistant

## 2017-07-25 MED ORDER — SUVOREXANT 15 MG PO TABS: 15.0000 mg | ORAL_TABLET | Freq: Every day | ORAL | 0 refills | Status: DC

## 2017-07-25 NOTE — Telephone Encounter (Signed)
I have printed a refill that can be picked up in office. I have received her Gene Site testing results and have a copy for her to have. Would like to have her schedule appointment to discuss these results together and next steps.

## 2017-07-25 NOTE — Telephone Encounter (Signed)
Pt says  Belsomera does make a difference but she would like to try the . She can come in and pick it up along with another coupon if possible. Also wanting to see if gene results are back to determine best anti depressant.

## 2017-07-26 ENCOUNTER — Ambulatory Visit: Admitting: Physician Assistant

## 2017-07-26 NOTE — Progress Notes (Signed)
Patient presents to clinic today to follow-up regarding insomnia and recent Gene Site testing to help find the best medications to treat her anxiety/depression.  Insomnia -- At last visit, patient was started on a trial of Belsomra 10 mg. Did take as directed with noted improvement. Denies side effect. Was still only getting 4-5 hours of restful sleep. Would like to discuss a higher dose.  Anxiety/Depression -- Patient with history of multi-drug failure. Currently on a combination of Viibryd , Abilify 2 mg daily and Wellbutrin XL 150 mg. Endorses taking as directed. Notes significant improvement in mood since addition of the Viibryd. Still having issue with weight gain despite diet and exercise. Is concerned that her Abilify is a major contributing factor. Would like to discuss slow wean.  Past Medical History:  Diagnosis Date  . Anxiety   . Asthma   . Depression   . History of migraine    No issue in 2 years  . SVT (supraventricular tachycardia) (HCC)     Current Outpatient Prescriptions on File Prior to Visit  Medication Sig Dispense Refill  . ARIPiprazole (ABILIFY) 2 MG tablet Take 1 tablet (2 mg total) by mouth daily. 90 tablet 1  . buPROPion (WELLBUTRIN XL) 150 MG 24 hr tablet Take 1 tablet (150 mg total) by mouth daily. 90 tablet 1  . cyclobenzaprine (FLEXERIL) 5 MG tablet Take 1 tablet (5 mg total) by mouth at bedtime as needed for muscle spasms. 30 tablet 1  . diazepam (VALIUM) 5 MG tablet Take 5 mg by mouth every 12 (twelve) hours as needed for anxiety.    . nebivolol (BYSTOLIC) 5 MG tablet Take 5 mg by mouth daily.    . Suvorexant (BELSOMRA) 15 MG TABS Take 15 mg by mouth at bedtime. 10 tablet 0  . Vilazodone HCl (VIIBRYD) 40 MG TABS Take 1 tablet (40 mg total) by mouth daily. 90 tablet 1   No current facility-administered medications on file prior to visit.     Allergies  Allergen Reactions  . Penicillins Nausea And Vomiting  . Sulfa Antibiotics Nausea And Vomiting      Family History  Problem Relation Age of Onset  . Hypertension Mother   . Hyperlipidemia Mother   . Suicidality Brother   . Angina Paternal Grandmother     Social History   Social History  . Marital status: Married    Spouse name: N/A  . Number of children: N/A  . Years of education: N/A   Social History Main Topics  . Smoking status: Never Smoker  . Smokeless tobacco: Never Used  . Alcohol use No  . Drug use: No  . Sexual activity: Yes   Other Topics Concern  . None   Social History Narrative  . None   Review of Systems - See HPI.  All other ROS are negative.  BP 102/72   Pulse 64   Temp 98.2 F (36.8 C) (Oral)   Resp 14   Ht  (1.651 m)   Wt 181 lb (82.1 kg)   SpO2 99%   BMI 30.12 kg/m   Physical Exam  Constitutional: She is oriented to person, place, and time and well-developed, well-nourished, and in no distress.  HENT:  Head: Normocephalic and atraumatic.  Eyes: Conjunctivae are normal.  Neck: Neck supple.  Cardiovascular: Normal rate, regular rhythm, normal heart sounds and intact distal pulses.   Pulmonary/Chest: Effort normal and breath sounds normal. No respiratory distress. She has no wheezes. She has no rales.  She exhibits no tenderness.  Neurological: She is alert and oriented to person, place, and time.  Skin: Skin is warm and dry. No rash noted.  Psychiatric: Affect normal.  Vitals reviewed.  Assessment/Plan: Primary insomnia Improved with belsomra. Will increase to 15 mg for more therapeutic effect. She is to let us know how this is working.   Depression, major, single episode, mild (HCC) Reviewed Gene Site testing. Will have copy scanned into chart. Patient has copy to take to Psychiatrist. The Viibryd is a recommended medication due to no gene-drug interaction. Will continue. Discussed appropriate wean of Abilify. Close follow-up scheduled.     Piedad Climes, PA-C

## 2017-07-26 NOTE — Telephone Encounter (Signed)
Advised patient we have printed a new rx for the Belsomra 15 mg. Her same coupon should work for free 10 pills of each dose. She is agreeable. Advised her results are available to discuss and appointment is made for 07/27/17 @ 9:45

## 2017-07-27 ENCOUNTER — Ambulatory Visit (INDEPENDENT_AMBULATORY_CARE_PROVIDER_SITE_OTHER): Admitting: Physician Assistant

## 2017-07-27 ENCOUNTER — Encounter: Payer: Self-pay | Admitting: Physician Assistant

## 2017-07-27 DIAGNOSIS — F320 Major depressive disorder, single episode, mild: Secondary | ICD-10-CM | POA: Diagnosis not present

## 2017-07-27 DIAGNOSIS — F5101 Primary insomnia: Secondary | ICD-10-CM | POA: Diagnosis not present

## 2017-07-27 NOTE — Patient Instructions (Signed)
Please continue medications as directed with the following exception: - decrease Abilify to 1/2 tablet (1 mg) daily for 2 weeks. - If doing well, decrease to 1/2 tablet every other day for 1 week before completely stopping.  Follow-up with me in 3-4 weeks. If you note any acute worsening of mood, please call or come see me ASAP. Look into the intermittent fasting.

## 2017-07-27 NOTE — Assessment & Plan Note (Signed)
Reviewed Gene Site testing. Will have copy scanned into chart. Patient has copy to take to Psychiatrist. The Viibryd is a recommended medication due to no gene-drug interaction. Will continue. Discussed appropriate wean of Abilify. Close follow-up scheduled.

## 2017-07-27 NOTE — Assessment & Plan Note (Signed)
Improved with belsomra. Will increase to 15 mg for more therapeutic effect. She is to let us know how this is working.

## 2017-07-27 NOTE — Progress Notes (Signed)
Pre visit review using our clinic review tool, if applicable. No additional management support is needed unless otherwise documented below in the visit note. 

## 2017-08-04 ENCOUNTER — Telehealth: Payer: Self-pay | Admitting: *Deleted

## 2017-08-04 DIAGNOSIS — F5101 Primary insomnia: Secondary | ICD-10-CM

## 2017-08-04 NOTE — Telephone Encounter (Signed)
Patient states that she did the sample of Belsomra and it worked well ( ), she is requesting a RX for this medication sent to [facility] in [place].

## 2017-08-04 NOTE — Telephone Encounter (Signed)
Ok to send in 30 tablets with 2 refills of Belsomra 15 mg tablet. Same Signature.

## 2017-08-05 ENCOUNTER — Telehealth: Payer: Self-pay | Admitting: Physician Assistant

## 2017-08-05 DIAGNOSIS — I471 Supraventricular tachycardia: Secondary | ICD-10-CM

## 2017-08-05 MED ORDER — ZOLPIDEM TARTRATE ER 6.25 MG PO TBCR: 6.2500 mg | EXTENDED_RELEASE_TABLET | Freq: Every evening | ORAL | 0 refills | Status: DC | PRN

## 2017-08-05 MED ORDER — SUVOREXANT 15 MG PO TABS: 15.0000 mg | ORAL_TABLET | Freq: Every day | ORAL | 2 refills | Status: DC

## 2017-08-05 NOTE — Telephone Encounter (Signed)
Per pcp will give rx for Ambien CR 6.25 mg qhs #15. Rx faxed to the [facility] pharmacy. Will start PA on Monday for the Belsomra. Patient is notified and is agreeable.

## 2017-08-05 NOTE — Telephone Encounter (Signed)
RX printed and waiting for provider to sign.

## 2017-08-05 NOTE — Telephone Encounter (Signed)
Okay for referral?

## 2017-08-05 NOTE — Telephone Encounter (Signed)
Pt [facility] Dr. Delton See for the continuing of care since she has moved to this area.

## 2017-08-05 NOTE — Addendum Note (Signed)
Addended by: Con Memos on: 08/05/2017 05:21 PM   Modules accepted: Orders

## 2017-08-05 NOTE — Telephone Encounter (Signed)
Pt states that [facility] called and this Rx needs prior auth before it can be filled, pt asking if ambien CR could be called instead of of belsomra, pt is asking if this could be done today.

## 2017-08-05 NOTE — Telephone Encounter (Signed)
Ok with me 

## 2017-08-08 NOTE — Telephone Encounter (Signed)
Patient notified of Belsomra rx was approved thru insurance.

## 2017-08-08 NOTE — Telephone Encounter (Signed)
Referral placed for Dr. Delton See @ [address], [address]

## 2017-08-16 ENCOUNTER — Encounter: Payer: Self-pay | Admitting: Physician Assistant

## 2017-08-16 ENCOUNTER — Ambulatory Visit (INDEPENDENT_AMBULATORY_CARE_PROVIDER_SITE_OTHER): Admitting: Physician Assistant

## 2017-08-16 VITALS — BP 110/70 | HR 69 | Temp 98.0°F | Resp 14 | Ht 65.0 in | Wt 184.0 lb

## 2017-08-16 DIAGNOSIS — G2581 Restless legs syndrome: Secondary | ICD-10-CM | POA: Diagnosis not present

## 2017-08-16 DIAGNOSIS — F320 Major depressive disorder, single episode, mild: Secondary | ICD-10-CM | POA: Diagnosis not present

## 2017-08-16 DIAGNOSIS — F411 Generalized anxiety disorder: Secondary | ICD-10-CM | POA: Diagnosis not present

## 2017-08-16 DIAGNOSIS — Z23 Encounter for immunization: Secondary | ICD-10-CM | POA: Diagnosis not present

## 2017-08-16 MED ORDER — CLONAZEPAM 0.5 MG PO TABS: 0.2500 mg | ORAL_TABLET | Freq: Two times a day (BID) | ORAL | 1 refills | Status: DC | PRN

## 2017-08-16 NOTE — Progress Notes (Signed)
Pre visit review using our clinic review tool, if applicable. No additional management support is needed unless otherwise documented below in the visit note. 

## 2017-08-16 NOTE — Patient Instructions (Signed)
Please stop Valium and start Clonazepam.  Restart Abilify at 1 mg daily.  Continue other medications as directed.  Stop by the lab. I will call you with your results.  We will alter regimen accordingly. Follow-up in 2 weeks.

## 2017-08-16 NOTE — Assessment & Plan Note (Signed)
Will change Valium for Clonazepam PRN as she noted some symptoms of RLS at end of visit. Iron levels today.

## 2017-08-16 NOTE — Assessment & Plan Note (Signed)
Restart Abilify at 1 mg daily. Continue Wellbutrin and Viibryd as directed. Follow-up in 2 weeks. Has psychiatry appointment in 1 month.

## 2017-08-16 NOTE — Progress Notes (Signed)
Patient presents to clinic today for follow-up of anxiety and depression. Patient currently on Wellbutrin XL 150 mg daily and Viibryd 40 mg daily. At last visit, we started a wean on her Abilify as she felt it was no longer needed and causing significant issue with weight loss. Since last visit, patient endorses she did well on 1 mg daily of Abilify. After starting QOD dosing and stopping she has noted increased anxiety, anhedonia and difficulty with sleep. Denies SI/HI. Is doing intermittent fasting with some weight loss. Would like to discuss options.  Past Medical History:  Diagnosis Date  . Anxiety   . Asthma   . Depression   . History of migraine    No issue in 2 years  . SVT (supraventricular tachycardia) (HCC)     Current Outpatient Prescriptions on File Prior to Visit  Medication Sig Dispense Refill  . buPROPion (WELLBUTRIN XL) 150 MG 24 hr tablet Take 1 tablet (150 mg total) by mouth daily. 90 tablet 1  . cyclobenzaprine (FLEXERIL) 5 MG tablet Take 1 tablet (5 mg total) by mouth at bedtime as needed for muscle spasms. 30 tablet 1  . nebivolol (BYSTOLIC) 5 MG tablet Take 5 mg by mouth daily.    . Suvorexant (BELSOMRA) 15 MG TABS Take 15 mg by mouth at bedtime. 30 tablet 2  . Vilazodone HCl (VIIBRYD) 40 MG TABS Take 1 tablet (40 mg total) by mouth daily. 90 tablet 1  . zolpidem (AMBIEN CR) 6.25 MG CR tablet Take 1 tablet (6.25 mg total) by mouth at bedtime as needed for sleep. 15 tablet 0  . ARIPiprazole (ABILIFY) 2 MG tablet Take 1 tablet (2 mg total) by mouth daily. (Patient not taking: Reported on 08/16/2017) 90 tablet 1   No current facility-administered medications on file prior to visit.     Allergies  Allergen Reactions  . Penicillins Nausea And Vomiting  . Sulfa Antibiotics Nausea And Vomiting    Family History  Problem Relation Age of Onset  . Hypertension Mother   . Hyperlipidemia Mother   . Suicidality Brother   . Angina Paternal Grandmother     Social  History   Social History  . Marital status: Married    Spouse name: N/A  . Number of children: N/A  . Years of education: N/A   Social History Main Topics  . Smoking status: Never Smoker  . Smokeless tobacco: Never Used  . Alcohol use No  . Drug use: No  . Sexual activity: Yes   Other Topics Concern  . None   Social History Narrative  . None   Review of Systems - See HPI.  All other ROS are negative.  BP 110/70   Pulse 69   Temp 98 F (36.7 C) (Oral)   Resp 14   Ht  (1.651 m)   Wt 184 lb (83.5 kg)   SpO2 98%   BMI 30.62 kg/m   Physical Exam  Constitutional: She is oriented to person, place, and time and well-developed, well-nourished, and in no distress.  HENT:  Head: Normocephalic and atraumatic.  Eyes: Conjunctivae are normal.  Neck: Neck supple.  Cardiovascular: Normal rate, regular rhythm, normal heart sounds and intact distal pulses.   Pulmonary/Chest: Effort normal and breath sounds normal. No respiratory distress. She has no wheezes. She has no rales. She exhibits no tenderness.  Neurological: She is alert and oriented to person, place, and time.  Skin: Skin is warm and dry. No rash noted.  Psychiatric: Affect normal.  Vitals reviewed.  Assessment/Plan: Depression, major, single episode, mild (HCC) Restart Abilify at 1 mg daily. Continue Wellbutrin and Viibryd as directed. Follow-up in 2 weeks. Has psychiatry appointment in 1 month.   Generalized anxiety disorder Will change Valium for Clonazepam PRN as she noted some symptoms of RLS at end of visit. Iron levels today.    Piedad Climes, PA-C

## 2017-08-17 LAB — IRON,TIBC AND FERRITIN PANEL
%SAT: 23 % (ref 11–50)
FERRITIN: 34 ng/mL (ref 10–232)
IRON: 67 ug/dL (ref 40–190)
TIBC: 289 ug/dL (ref 250–450)

## 2017-08-22 ENCOUNTER — Ambulatory Visit: Admitting: Physician Assistant

## 2017-08-23 ENCOUNTER — Telehealth: Payer: Self-pay | Admitting: Physician Assistant

## 2017-08-23 ENCOUNTER — Ambulatory Visit: Admitting: Physician Assistant

## 2017-08-23 MED ORDER — BUPROPION HCL 75 MG PO TABS: 75.0000 mg | ORAL_TABLET | Freq: Two times a day (BID) | ORAL | 0 refills | Status: DC

## 2017-08-23 NOTE — Telephone Encounter (Signed)
Spoke with patient. She does want the  called in. She is aware to take as directed until her follow-up with PCP next week.

## 2017-08-23 NOTE — Telephone Encounter (Signed)
Still having trouble sleeping, discussed decreasing dosage of buPROPion (WELLBUTRIN XL) 150 MG 24 hr tablet.  She wants to know if pcp would consider prescribing a lower dosage.   She understands it will not be XL.  Pharmacy:  [id number] - [place], [place] - [address] [phone] (Phone) [phone] (Fax)

## 2017-08-23 NOTE — Telephone Encounter (Signed)
I would not feel that decreasing the wellbutrin would make a difference in sleep but we can try if she would like. If she still wants, we can decrease to 75 mg BID of regular release.

## 2017-08-25 ENCOUNTER — Telehealth: Payer: Self-pay | Admitting: *Deleted

## 2017-08-25 MED ORDER — ZOLPIDEM TARTRATE ER 12.5 MG PO TBCR: 12.5000 mg | EXTENDED_RELEASE_TABLET | Freq: Every evening | ORAL | 0 refills | Status: DC | PRN

## 2017-08-25 NOTE — Telephone Encounter (Signed)
Patient aware that medication is being increased.  Information has been faxed to pharmacy.

## 2017-08-25 NOTE — Telephone Encounter (Signed)
Pt. Calling in stating that she is still having trouble sleeping and now she would like an increase on [place].    Routed to provider to advise.

## 2017-08-25 NOTE — Telephone Encounter (Signed)
Will increase. New Rx printed to be faxed to local pharmacy.

## 2017-08-30 ENCOUNTER — Ambulatory Visit: Admitting: Physician Assistant

## 2017-09-06 ENCOUNTER — Ambulatory Visit (INDEPENDENT_AMBULATORY_CARE_PROVIDER_SITE_OTHER): Admitting: Physician Assistant

## 2017-09-06 ENCOUNTER — Encounter: Payer: Self-pay | Admitting: Physician Assistant

## 2017-09-06 VITALS — BP 102/70 | HR 74 | Temp 98.0°F | Resp 14 | Ht 65.0 in | Wt 178.0 lb

## 2017-09-06 DIAGNOSIS — N926 Irregular menstruation, unspecified: Secondary | ICD-10-CM

## 2017-09-06 MED ORDER — LEVONORGEST-ETH ESTRAD 91-DAY 0.15-0.03 &0.01 MG PO TABS: 1.0000 | ORAL_TABLET | Freq: Every day | ORAL | 4 refills | Status: DC

## 2017-09-06 NOTE — Progress Notes (Signed)
Pre visit review using our clinic review tool, if applicable. No additional management support is needed unless otherwise documented below in the visit note. 

## 2017-09-06 NOTE — Progress Notes (Signed)
Patient presents to clinic today requesting referral to gynecology. Patient also notes increased frequency of menstrual periods, occurring every 3 weeks Lasting 3-4 days which patient states is normal. Denies heavy bleeding, clotting, lightheadedness or dizziness. Notes significant PMS prior to bleed. Would like to be placed back on Seasonique which she has taken previously. LMP finished a couple of days ago. Husband has had vasectomy.   Past Medical History:  Diagnosis Date  . Anxiety   . Asthma   . Depression   . History of migraine    No issue in 2 years  . SVT (supraventricular tachycardia) (HCC)     Current Outpatient Prescriptions on File Prior to Visit  Medication Sig Dispense Refill  . ARIPiprazole (ABILIFY) 2 MG tablet Take 1 tablet (2 mg total) by mouth daily. 90 tablet 1  . clonazePAM (KLONOPIN) 0.5 MG tablet Take 0.5 tablets (0.25 mg total) by mouth 2 (two) times daily as needed for anxiety. 30 tablet 1  . cyclobenzaprine (FLEXERIL) 5 MG tablet Take 1 tablet (5 mg total) by mouth at bedtime as needed for muscle spasms. 30 tablet 1  . nebivolol (BYSTOLIC) 5 MG tablet Take 5 mg by mouth daily.    . Suvorexant (BELSOMRA) 15 MG TABS Take 15 mg by mouth at bedtime. 30 tablet 2  . Vilazodone HCl (VIIBRYD) 40 MG TABS Take 1 tablet (40 mg total) by mouth daily. 90 tablet 1   No current facility-administered medications on file prior to visit.     Allergies  Allergen Reactions  . Penicillins Nausea And Vomiting  . Sulfa Antibiotics Nausea And Vomiting    Family History  Problem Relation Age of Onset  . Hypertension Mother   . Hyperlipidemia Mother   . Suicidality Brother   . Angina Paternal Grandmother     Social History   Social History  . Marital status: Married    Spouse name: N/A  . Number of children: N/A  . Years of education: N/A   Social History Main Topics  . Smoking status: Never Smoker  . Smokeless tobacco: Never Used  . Alcohol use No  . Drug use:  No  . Sexual activity: Yes   Other Topics Concern  . None   Social History Narrative  . None   Review of Systems - See HPI.  All other ROS are negative.  BP 102/70   Pulse 74   Temp 98 F (36.7 C) (Oral)   Resp 14   Ht 5\' 5"  (1.651 m)   Wt 178 lb (80.7 kg)   SpO2 98%   BMI 29.62 kg/m   Physical Exam  Constitutional: She is oriented to person, place, and time and well-developed, well-nourished, and in no distress.  HENT:  Head: Normocephalic and atraumatic.  Eyes: Conjunctivae are normal.  Neck: Neck supple.  Cardiovascular: Normal rate, regular rhythm, normal heart sounds and intact distal pulses.   Pulmonary/Chest: Effort normal and breath sounds normal. No respiratory distress. She has no wheezes. She has no rales. She exhibits no tenderness.  Neurological: She is alert and oriented to person, place, and time.  Skin: Skin is warm and dry. No rash noted.  Psychiatric: Affect normal.  Vitals reviewed.  Recent Results (from the past 2160 hour(s))  Iron, TIBC and Ferritin Panel     Status: None   Collection Time: 08/16/17 10:46 AM  Result Value Ref Range   Iron 67 40 - 190 mcg/dL   TIBC 295 621 - 308 mcg/dL (calc)   %  SAT 23 11 - 50 % (calc)   Ferritin 34 10 - 232 ng/mL   Assessment/Plan: 1. Menstrual abnormality Referral to GYN placed. Patient just completed period. Husband s/p vasectomy. Will start Seasonique at patient request after discussion of risks and benefits.  - Ambulatory referral to Gynecology - Levonorgestrel-Ethinyl Estradiol (AMETHIA,CAMRESE) 0.15-0.03 &0.01 MG tablet; Take 1 tablet by mouth daily.  Dispense: 1 Package; Refill: 4   Marcelline MatesMartin, Nkenge Sonntag [place], [place]-C

## 2017-09-06 NOTE — Patient Instructions (Signed)
Please follow-up with Psychiatry as scheduled Thursday. I am setting you up with GYN at your request.  You will receive a call for an appointment. Start the [place] as directed on Sunday.

## 2017-09-08 ENCOUNTER — Ambulatory Visit (INDEPENDENT_AMBULATORY_CARE_PROVIDER_SITE_OTHER): Admitting: Psychiatry

## 2017-09-08 ENCOUNTER — Encounter (HOSPITAL_COMMUNITY): Payer: Self-pay | Admitting: Psychiatry

## 2017-09-08 VITALS — BP 102/68 | HR 67 | Ht 65.0 in | Wt 178.5 lb

## 2017-09-08 DIAGNOSIS — F3341 Major depressive disorder, recurrent, in partial remission: Secondary | ICD-10-CM

## 2017-09-08 DIAGNOSIS — F5101 Primary insomnia: Secondary | ICD-10-CM

## 2017-09-08 MED ORDER — NORTRIPTYLINE HCL 25 MG PO CAPS: 25.0000 mg | ORAL_CAPSULE | Freq: Every day | ORAL | 1 refills | Status: DC

## 2017-09-08 MED ORDER — NORTRIPTYLINE HCL 25 MG PO CAPS: 25.0000 mg | ORAL_CAPSULE | Freq: Every day | ORAL | 2 refills | Status: DC

## 2017-09-08 MED ORDER — VILAZODONE HCL 40 MG PO TABS: 40.0000 mg | ORAL_TABLET | Freq: Every day | ORAL | 1 refills | Status: DC

## 2017-09-08 NOTE — Progress Notes (Signed)
Psychiatric Initial Adult Assessment   Patient Identification: Cassandra Leach MRN:  [id number] Date of Evaluation:  09/08/2017 Referral Source: depression Chief Complaint:  depression, med management Visit Diagnosis:    ICD-10-CM   1. Recurrent major depressive disorder, in partial remission (HCC) F33.41   2. Primary insomnia F51.01     History of Present Illness:  Cassandra Leach is a 46 year old female with a history of major depressive disorder, long-standing with fair control on SSRI therapy.  She has failed multiple SSRI agents after 6+ months of use, and most recently was placed on vortioxetine approximately 2 months ago.  She takes this in conjunction with Abilify 2 mg daily.  She reports that she continues to struggle with some dysphoria and irritability.  She has been on Abilify for over 3 years, and has significant concerns about the weight gain and metabolic effects, and risk of TD.  She is committed to coming off of Abilify.   I spent time with her learning about her upbringing and [place], her social history and relationship with her mother and father.  Spent time learning about the relationship with her in-laws, and her strong relationship with her husband who is retired Hotel managermilitary.  She is very proud of her husband's service, but also acknowledges that [employer] and Companymilitary life was quite difficult as a wife.  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  [place] year ago and is working on settling in, her 48 year old mother lives with her and her husband.  They never had any children.  The patient reports that she has engaged in individual therapy in the past and is agreeable to restart therapy in this office.  She reports that she has tried Prozac, Zoloft, Paxil, Cymbalta, and her current regimen.  She is also been on Lamictal, Geodon, Rexulti for augmentation.  I spent time with her reviewing the risks and  benefits of TMS and educating her on this treatment modality for depression.  I provided her with educational material, and underscored the risk of seizure is always possible.   We agreed to start nortriptyline and discontinue Abilify.  The goal for nortriptyline will be to augment her antidepressants and help with insomnia.  She agrees to follow-up with this writer in 10-12 weeks or sooner if needed.  I reviewed the risk of serotonin syndrome with her, and instructed her to discontinue and visit the ER if she has symptoms of fever, confusion, nausea, vomiting, muscle twitching.    She denies any suicidal thoughts and has no psychiatric hospitalization history.  She does have a family history of completed suicide in her brother when he was 48 years old.  Associated Signs/Symptoms: Depression Symptoms:  depressed mood, insomnia, fatigue, difficulty concentrating, anxiety, weight gain, (Hypo) Manic Symptoms:  Irritable Mood, Anxiety Symptoms:  Social Anxiety, Psychotic Symptoms:  none PTSD Symptoms: Negative  Past Psychiatric History: Outpatient treatment, no inpatient psychiatric hospitalizations  Previous Psychotropic Medications: Yes   Substance Abuse History in the last 12 months:  No.  Consequences of Substance Abuse: Negative  Past Medical History:  Past Medical History:  Diagnosis Date  . Anxiety   . Asthma   . Depression   . History of migraine    No issue in 2 years  . SVT (supraventricular tachycardia) (HCC)     Past Surgical History:  Procedure Laterality Date  . RADIOFREQUENCY ABLATION NERVES  Family Psychiatric History: Family history of completed suicide  Family History:  Family History  Problem Relation Age of Onset  . Hypertension Mother   . Hyperlipidemia Mother   . Suicidality Brother   . Angina Paternal Grandmother     Social History:   Social History   Social History  . Marital status: Married    Spouse name: N/A  . Number of children:  N/A  . Years of education: N/A   Social History Main Topics  . Smoking status: Never Smoker  . Smokeless tobacco: Never Used  . Alcohol use No  . Drug use: No  . Sexual activity: Yes    Birth control/ protection: Pill   Other Topics Concern  . None   Social History Narrative  . None    Additional Social History: Lives with her husband who is retired Hotel manager, and 51 year old mother  Allergies:   Allergies  Allergen Reactions  . Penicillins Nausea And Vomiting  . Sulfa Antibiotics Nausea And Vomiting    Metabolic Disorder Labs: No results found for: HGBA1C, MPG No results found for: PROLACTIN No results found for: CHOL, TRIG, HDL, CHOLHDL, VLDL, LDLCALC   Current Medications: Current Outpatient Prescriptions  Medication Sig Dispense Refill  . cyclobenzaprine (FLEXERIL) 5 MG tablet Take 1 tablet (5 mg total) by mouth at bedtime as needed for muscle spasms. 30 tablet 1  . Levonorgestrel-Ethinyl Estradiol (AMETHIA,CAMRESE) 0.15-0.03 &0.01 MG tablet Take 1 tablet by mouth daily. 1 Package 4  . nebivolol (BYSTOLIC) 5 MG tablet Take 5 mg by mouth daily.    . Vilazodone HCl (VIIBRYD) 40 MG TABS Take 1 tablet (40 mg total) by mouth daily. 90 tablet 1  . nortriptyline (PAMELOR) 25 MG capsule Take 1 capsule (25 mg total) by mouth at bedtime. 90 capsule 1   No current facility-administered medications for this visit.     Neurologic: Headache: Negative Seizure: Negative Paresthesias:Negative  Musculoskeletal: Strength & Muscle Tone: within normal limits Gait & Station: normal Patient leans: N/A  Psychiatric Specialty Exam: Review of Systems  Constitutional: Negative.   HENT: Negative.   Eyes: Negative.   Respiratory: Negative.   Cardiovascular: Negative.   Gastrointestinal: Positive for heartburn.  Musculoskeletal: Negative.   Neurological: Positive for headaches.  Psychiatric/Behavioral: Positive for depression. Negative for hallucinations, memory loss, substance  abuse and suicidal ideas. The patient is nervous/anxious and has insomnia.     Blood pressure 102/68, pulse 67, height 5\' 5"  (1.651 m), weight 178 lb 8 oz (81 kg).Body mass index is 29.7 kg/m.  General Appearance: Casual and Fairly Groomed  Eye Contact:  Good  Speech:  Clear and Coherent  Volume:  Normal  Mood:  Dysphoric  Affect:  Congruent  Thought Process:  Goal Directed and Descriptions of Associations: Intact  Orientation:  Full (Time, Place, and Person)  Thought Content:  Logical  Suicidal Thoughts:  No  Homicidal Thoughts:  No  Memory:  Immediate;   Good  Judgement:  Fair  Insight:  Good  Psychomotor Activity:  Normal  Concentration:  Concentration: Good  Recall:  Good  Fund of Knowledge:Good  Language: Good  Akathisia:  Negative  Handed:  Right  AIMS (if indicated):  0  Assets:  Communication Skills Desire for Improvement Financial Resources/Insurance Housing Intimacy Leisure Time Physical Health Resilience Social Support Talents/Skills Transportation Vocational/Educational  ADL's:  Intact  Cognition: WNL  Sleep:  5-8 hours    Treatment Plan Summary: Keyairra Kolinski is a 46 year old female with a history of treatment resistant  major depressive disorder, 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  She presents today with the primary goal of being taken off of Abilify, and has had substantial difficulty in tapering and discontinuing this on an outpatient basis with her primary care provider.  Abilify has a long half-life, and I recommend that we abruptly discontinue, given that she is on a low-dose.  I would also suggest we start a low-dose of Pamelor to help with augmentation of her antidepressant, and this would be a novel class of antidepressant for the patient as she has never been tried on tricyclics.  If this fails, I would ultimately recommend that we proceed with TMS and have provided her educational material to review the risks and benefits.  1. Recurrent major  depressive disorder, in partial remission (HCC)   2. Primary insomnia     Status of current problems: new to [employer] Ordered: No orders of the defined types were placed in this encounter.   Labs Reviewed: NA  Collateral Obtained/Records Reviewed: N/A  Plan:  Continue Viibryd 40 mg daily Discontinue Abilify Initiate Pamelor 25 mg nightly RTC 10-12 weeks   I spent 45 minutes with the patient in direct face-to-face clinical care.  Greater than 50% of this time was spent in counseling and coordination of care with the patient.    Burnard Leigh, MD 10/25/20184:17 PM

## 2017-09-11 ENCOUNTER — Encounter (HOSPITAL_COMMUNITY): Payer: Self-pay | Admitting: Psychiatry

## 2017-09-20 ENCOUNTER — Telehealth (HOSPITAL_COMMUNITY): Payer: Self-pay

## 2017-09-20 MED ORDER — MIRTAZAPINE 15 MG PO TABS: 15.0000 mg | ORAL_TABLET | Freq: Every day | ORAL | 0 refills | Status: DC

## 2017-09-20 NOTE — Telephone Encounter (Signed)
She can stop nortriptyline and we can start her on remeron 15 mg nightly.  I don't see that she had been on that before but let me know if that was the case

## 2017-09-20 NOTE — Telephone Encounter (Signed)
Okay, I called the patient to let her know and I sent the rx to her pharmacy.

## 2017-09-20 NOTE — Telephone Encounter (Signed)
Patient calling and said that she is still restless at night, unable to get to sleep. She said the Nortriptyline is not working. She would like to know what she should do. Please review and advise, thank you

## 2017-09-22 ENCOUNTER — Telehealth (HOSPITAL_COMMUNITY): Payer: Self-pay

## 2017-09-22 NOTE — Telephone Encounter (Signed)
She was on abilify 2 mg, my suspicion of withdrawal is very low.  Sounds like she was having mood issues and does better with abilify. She can resume 2 mg daily and she should be aware that we will continue this for long-term management.  I have reviewed the risks of long term antipsychotic with her at our last visit.  She can go ahead and stop pamelor since we are resuming abilify

## 2017-09-22 NOTE — Telephone Encounter (Signed)
Patient called and said that she has been going through withdrawals from discontinuing the Abilify. She said that her anxiety is worse as well. Before I had the chance to call patient back, she left another voicemail stating that she took an Abilify because the withdrawal was so bad. Please review and advise, thank you

## 2017-09-26 ENCOUNTER — Other Ambulatory Visit: Payer: Self-pay

## 2017-09-26 ENCOUNTER — Ambulatory Visit (INDEPENDENT_AMBULATORY_CARE_PROVIDER_SITE_OTHER): Admitting: Physician Assistant

## 2017-09-26 ENCOUNTER — Encounter: Payer: Self-pay | Admitting: Physician Assistant

## 2017-09-26 DIAGNOSIS — F5101 Primary insomnia: Secondary | ICD-10-CM

## 2017-09-26 MED ORDER — TRAZODONE HCL 50 MG PO TABS: 25.0000 mg | ORAL_TABLET | Freq: Every evening | ORAL | 0 refills | Status: DC | PRN

## 2017-09-26 NOTE — Progress Notes (Signed)
Pre visit review using our clinic review tool, if applicable. No additional management support is needed unless otherwise documented below in the visit note. 

## 2017-09-26 NOTE — Patient Instructions (Addendum)
Please continue the Abilify and the Viibryd.  Start the Trazodone at night for rest. Do not take more than as directed. See information below that we discussed during visit. If you note any of the symptoms mentioned, stop the Trazodone and call me.  For nasal congestion, please stop the Afrin as it can cause rebound congestion. Continue Flonase and add on saline-nasal spray. Start a Zyrtec or xyzal for symptom relief.  Follow-up in 2 weeks.  Serotonin Syndrome Serotonin is a brain chemical that regulates the nervous system, which includes the brain, spinal cord, and nerves. Serotonin appears to play a role in all types of behavior, including appetite, emotions, movement, thinking, and response to stress. Excessively high levels of serotonin in the body can cause serotonin syndrome, which is a very dangerous condition. What are the causes? This condition can be caused by taking medicines or drugs that increase the level of serotonin in your body. These include:  Antidepressant medicines.  Migraine medicines.  Certain pain medicines.  Certain recreational drugs, including ecstasy, LSD, cocaine, and amphetamines.  Over-the-counter cough or cold medicines that contain dextromethorphan.  Certain herbal supplements, including St. John's wort, ginseng, and nutmeg.  This condition usually occurs when you take these medicines or drugs in combination, but it can also happen with a high dose of a single medicine or drug. What increases the risk? This condition is more likely to develop in:  People who have recently increased the dosage of medicine that increases the serotonin level.  People who just started taking medicine that increases the serotonin level.  What are the signs or symptoms? Symptoms of this condition usually happens within several hours of a medicine change. Symptoms include:  Headache.  Muscle twitching or stiffness.  Diarrhea.  Confusion.  Restlessness or  agitation.  Shivering or goose bumps.  Loss of muscle coordination.  Rapid heart rate.  Sweating.  Severe cases of serotonin syndromecan cause:  Irregular heartbeat.  Seizures.  Loss of consciousness.  High fever.  How is this diagnosed? This condition is diagnosed with a medical history and physical exam. You will be asked aboutyour symptoms and your use of medicines and recreational drugs. Your health care provider may also order lab work or additional tests to rule out other causes of your symptoms. How is this treated? The treatment for this condition depends on the severity of your symptoms. For mild cases, stopping the medicine that caused your condition is usually all that is needed. For moderate to severe cases, hospitalization is required to monitor you and to prevent further muscle damage. Follow these instructions at home:  Take over-the-counter and prescription medicines only as told by your health care provider. This is important.  Check with your health care provider before you start taking any new prescriptions, over-the-counter medicines, herbs, or supplements.  Avoid combining any medicines that can cause this condition to occur.  Keep all follow-up visits as told by your health care provider.This is important.  Maintain a healthy lifestyle. ? Eat healthy foods. ? Get plenty of sleep. ? Exercise regularly. ? Do not drink alcohol. ? Do not use recreational drugs. Contact a health care provider if:  Medicines do not seem to be helping.  Your symptoms do not improve or they get worse.  You have trouble taking care of yourself. Get help right away if:  You have worsening confusion, severe headache, chest pain, high fever, seizures, or loss of consciousness.  You have serious thoughts about hurting yourself or others.  You experience serious side effects of medicine, such as swelling of your face, lips, tongue, or throat. This information is not  intended to replace advice given to you by your health care provider. Make sure you discuss any questions you have with your health care provider. Document Released: 12/09/2004 Document Revised: 06/26/2016 Document Reviewed: 11/14/2014 Elsevier Interactive Patient Education  [employer]2018 Elsevier Inc.

## 2017-09-26 NOTE — Progress Notes (Signed)
Patient presents to clinic today to discuss medication for sleep. Patient was recently seen by psychiatry for MDD and anxiety. Was taken off of her Abilify and started on Remeron for mood and sleep. States it did help with sleep but caused her to gain a significant amount of weight that she has worked very hard to lose. States she spoke with Psychiatrist but they have not returned her calls. States she will be establishing with a different provider. Would like to discuss other medications for sleep. Restarted her Ambien 10 days ago.  Past Medical History:  Diagnosis Date  . Anxiety   . Asthma   . Depression   . History of migraine    No issue in 2 years  . SVT (supraventricular tachycardia) (HCC)     Current Outpatient Medications on File Prior to Visit  Medication Sig Dispense Refill  . ARIPiprazole (ABILIFY) 2 MG tablet Take 1 tablet daily by mouth.    . cyclobenzaprine (FLEXERIL) 5 MG tablet Take 1 tablet (5 mg total) by mouth at bedtime as needed for muscle spasms. 30 tablet 1  . Levonorgestrel-Ethinyl Estradiol (AMETHIA,CAMRESE) 0.15-0.03 &0.01 MG tablet Take 1 tablet by mouth daily. 1 Package 4  . nebivolol (BYSTOLIC) 5 MG tablet Take 5 mg by mouth daily.    . Vilazodone HCl (VIIBRYD) 40 MG TABS Take 1 tablet (40 mg total) by mouth daily. 90 tablet 1   No current facility-administered medications on file prior to visit.     Allergies  Allergen Reactions  . Penicillins Nausea And Vomiting  . Sulfa Antibiotics Nausea And Vomiting    Family History  Problem Relation Age of Onset  . Hypertension Mother   . Hyperlipidemia Mother   . Suicidality Brother   . Angina Paternal Grandmother     Social History   Socioeconomic History  . Marital status: Married    Spouse name: None  . Number of children: None  . Years of education: None  . Highest education level: None  Social Needs  . Financial resource strain: None  . Food insecurity - worry: None  . Food insecurity -  inability: None  . Transportation needs - medical: None  . Transportation needs - non-medical: None  Occupational History  . None  Tobacco Use  . Smoking status: Never Smoker  . Smokeless tobacco: Never Used  Substance and Sexual Activity  . Alcohol use: No  . Drug use: No  . Sexual activity: Yes    Birth control/protection: Pill  Other Topics Concern  . None  Social History Narrative  . None   Review of Systems - See HPI.  All other ROS are negative.  BP 102/70   Pulse 72   Temp 98.4 F (36.9 C) (Oral)   Resp 14   Ht 5\' 5"  (1.651 m)   Wt 186 lb (84.4 kg)   SpO2 98%   BMI 30.95 kg/m   Physical Exam  Constitutional: She is oriented to person, place, and time and well-developed, well-nourished, and in no distress.  HENT:  Head: Normocephalic and atraumatic.  Nose: Rhinorrhea present. No mucosal edema. Right sinus exhibits no maxillary sinus tenderness and no frontal sinus tenderness. Left sinus exhibits no maxillary sinus tenderness and no frontal sinus tenderness.  Eyes: Conjunctivae are normal.  Cardiovascular: Normal rate, regular rhythm, normal heart sounds and intact distal pulses.  Pulmonary/Chest: Effort normal and breath sounds normal. No respiratory distress. She has no wheezes. She has no rales. She exhibits no tenderness.  Neurological: She is alert and oriented to person, place, and time.  Skin: Skin is warm and dry. No rash noted.  Psychiatric: Affect normal.  Vitals reviewed.   Recent Results (from the past 2160 hour(s))  Iron, TIBC and Ferritin Panel     Status: None   Collection Time: 08/16/17 10:46 AM  Result Value Ref Range   Iron 67 40 - 190 mcg/dL   TIBC 784289 696250 - 295450 mcg/dL (calc)   %SAT 23 11 - 50 % (calc)   Ferritin 34 10 - 232 ng/mL    Assessment/Plan: Primary insomnia Start trial of Trazodone 25 mg nightly to help with sleep and anxiety. Close follow-up scheduled.    Piedad ClimesMartin, Storm Sovine Cody, PA-C

## 2017-09-26 NOTE — Telephone Encounter (Signed)
I called patient and let her know what Dr. Rene KocherEksir said and left my number for her to call if she has any questions

## 2017-09-26 NOTE — Assessment & Plan Note (Signed)
Start trial of Trazodone 25 mg nightly to help with sleep and anxiety. Close follow-up scheduled.

## 2017-09-28 ENCOUNTER — Encounter: Payer: Self-pay | Admitting: Physician Assistant

## 2017-09-28 ENCOUNTER — Other Ambulatory Visit: Payer: Self-pay

## 2017-09-28 ENCOUNTER — Ambulatory Visit (INDEPENDENT_AMBULATORY_CARE_PROVIDER_SITE_OTHER): Admitting: Physician Assistant

## 2017-09-28 VITALS — BP 110/70 | HR 69 | Temp 98.7°F | Resp 14 | Ht 65.0 in | Wt 186.0 lb

## 2017-09-28 DIAGNOSIS — M542 Cervicalgia: Secondary | ICD-10-CM | POA: Diagnosis not present

## 2017-09-28 DIAGNOSIS — R51 Headache: Secondary | ICD-10-CM | POA: Diagnosis not present

## 2017-09-28 DIAGNOSIS — R519 Headache, unspecified: Secondary | ICD-10-CM

## 2017-09-28 MED ORDER — HYDROCODONE-ACETAMINOPHEN 5-325 MG PO TABS: 1.0000 | ORAL_TABLET | Freq: Four times a day (QID) | ORAL | 0 refills | Status: DC | PRN

## 2017-09-28 NOTE — Progress Notes (Signed)
Patient presents to clinic today c/o recurrence of occipital neck pain over the past 24 hours. Patient has history of this issue > 3 years ago that lasted for several years despite multiple workups and treatments. Finally had a nerve ablation with resolution of symptoms. Is wanting to see a specialist in the area for assessment. Pain is sharp and located in R occipital region. Is not radiating. Denies muscle tension or spasm. Has taken Tylenol or Ibuprofen.   Past Medical History:  Diagnosis Date  . Anxiety   . Asthma   . Depression   . History of migraine    No issue in 2 years  . SVT (supraventricular tachycardia) (HCC)     Current Outpatient Medications on File Prior to Visit  Medication Sig Dispense Refill  . ARIPiprazole (ABILIFY) 2 MG tablet Take 1 tablet daily by mouth.    . cyclobenzaprine (FLEXERIL) 5 MG tablet Take 1 tablet (5 mg total) by mouth at bedtime as needed for muscle spasms. 30 tablet 1  . Levonorgestrel-Ethinyl Estradiol (AMETHIA,CAMRESE) 0.15-0.03 &0.01 MG tablet Take 1 tablet by mouth daily. 1 Package 4  . nebivolol (BYSTOLIC) 5 MG tablet Take 5 mg by mouth daily.    . traZODone (DESYREL) 50 MG tablet Take 0.5 tablets (25 mg total) at bedtime as needed by mouth for sleep. 15 tablet 0  . Vilazodone HCl (VIIBRYD) 40 MG TABS Take 1 tablet (40 mg total) by mouth daily. 90 tablet 1   No current facility-administered medications on file prior to visit.     Allergies  Allergen Reactions  . Penicillins Nausea And Vomiting  . Sulfa Antibiotics Nausea And Vomiting    Family History  Problem Relation Age of Onset  . Hypertension Mother   . Hyperlipidemia Mother   . Suicidality Brother   . Angina Paternal Grandmother     Social History   Socioeconomic History  . Marital status: Married    Spouse name: None  . Number of children: None  . Years of education: None  . Highest education level: None  Social Needs  . Financial resource strain: None  . Food  insecurity - worry: None  . Food insecurity - inability: None  . Transportation needs - medical: None  . Transportation needs - non-medical: None  Occupational History  . None  Tobacco Use  . Smoking status: Never Smoker  . Smokeless tobacco: Never Used  Substance and Sexual Activity  . Alcohol use: No  . Drug use: No  . Sexual activity: Yes    Birth control/protection: Pill  Other Topics Concern  . None  Social History Narrative  . None   Review of Systems - See HPI.  All other ROS are negative.  BP 110/70   Pulse 69   Temp 98.7 F (37.1 C) (Oral)   Resp 14   Ht 5\' 5"  (1.651 m)   Wt 186 lb (84.4 kg)   SpO2 98%   BMI 30.95 kg/m   Physical Exam  Constitutional: She is oriented to person, place, and time and well-developed, well-nourished, and in no distress.  HENT:  Head: Normocephalic and atraumatic.  Eyes: Conjunctivae are normal.  Neck: No spinous process tenderness and no muscular tenderness present. Normal range of motion present.    Cardiovascular: Normal rate, regular rhythm, normal heart sounds and intact distal pulses.  Pulmonary/Chest: Effort normal and breath sounds normal. No respiratory distress. She has no wheezes. She has no rales. She exhibits no tenderness.  Neurological: She is  alert and oriented to person, place, and time.  Skin: Skin is warm and dry. No rash noted.  Vitals reviewed.   Recent Results (from the past 2160 hour(s))  Iron, TIBC and Ferritin Panel     Status: None   Collection Time: 08/16/17 10:46 AM  Result Value Ref Range   Iron 67 40 - 190 mcg/dL   TIBC 045289 409250 - 811450 mcg/dL (calc)   %SAT 23 11 - 50 % (calc)   Ferritin 34 10 - 232 ng/mL    Assessment/Plan: 1. Occipital pain 2. Cervicalgia Referral to Physical Medicine and Rehabilitation placed per patient request. States this feels exactly the same as previous and the only thing that will work is an ablation. Declines further assessment today. Will give short course of  Hydrocodone for severe pain to help calm it down so she can transition to Tylenol/Ibuprofen and it be more effective.  - Ambulatory referral to Pain Clinic   Piedad ClimesMartin, Joscelin Fray [place], [place]PA-C

## 2017-09-28 NOTE — Progress Notes (Signed)
Pre visit review using our clinic review tool, if applicable. No additional management support is needed unless otherwise documented below in the visit note. 

## 2017-09-28 NOTE — Patient Instructions (Signed)
Continue heat to the area. I recommend a topical Icy Hot or Aspercreme. Take the hydrocodone as directed to suppress pain. Then you can switch back to your ibuprofen/tylenol to keep pain at bay. Any worsening symptoms, please come see us.  I am setting you up with the Physical Medicine and Rehabilitation practice you requested for assessment giving history.

## 2017-09-29 ENCOUNTER — Encounter: Payer: Self-pay | Admitting: Physician Assistant

## 2017-09-29 ENCOUNTER — Ambulatory Visit: Payer: Self-pay | Admitting: Obstetrics & Gynecology

## 2017-10-10 ENCOUNTER — Ambulatory Visit: Payer: Self-pay | Admitting: Physician Assistant

## 2017-10-11 ENCOUNTER — Encounter: Payer: Self-pay | Admitting: Physician Assistant

## 2017-10-11 NOTE — Telephone Encounter (Signed)
Cassandra Leach, please see MyChart message.  Can we get this sorted out?

## 2017-10-12 ENCOUNTER — Ambulatory Visit (INDEPENDENT_AMBULATORY_CARE_PROVIDER_SITE_OTHER): Admitting: Physician Assistant

## 2017-10-12 ENCOUNTER — Other Ambulatory Visit: Payer: Self-pay | Admitting: Physician Assistant

## 2017-10-12 ENCOUNTER — Encounter: Payer: Self-pay | Admitting: Physician Assistant

## 2017-10-12 ENCOUNTER — Other Ambulatory Visit: Payer: Self-pay

## 2017-10-12 VITALS — BP 110/70 | HR 68 | Temp 97.9°F | Resp 14 | Ht 65.0 in | Wt 189.0 lb

## 2017-10-12 DIAGNOSIS — M542 Cervicalgia: Secondary | ICD-10-CM | POA: Diagnosis not present

## 2017-10-12 MED ORDER — GABAPENTIN 100 MG PO CAPS: 100.0000 mg | ORAL_CAPSULE | Freq: Three times a day (TID) | ORAL | 0 refills | Status: DC

## 2017-10-12 NOTE — Telephone Encounter (Signed)
Copied from CRM [id number]. Topic: General - Other >> Oct 12, 2017  9:59 AM Cipriano BunkerLambe, Annette S wrote: Reason for CRM:  [facility], Karrie MeresKeicha [phone] called saying the order was put in wrong and they worked that out. The patient is wanting to have a nerve ablation which is done by a doctor.  She does not want to do any therapy.

## 2017-10-12 NOTE — Patient Instructions (Signed)
Please speak with Cassandra Leach up front so we can get the referral sorted out. Please start the Gabapentin three times daily to start to help with nerve pain. Tylenol or Ibuprofen for breakthrough pain.  Turmeric supplements are good for inflammation as well.  Call me in a couple of days to let me know if you are tolerating the Gabapentin. We may have to increase the dose.

## 2017-10-12 NOTE — Progress Notes (Signed)
Patient presents to clinic today c/o continued nerve irritation in her neck and occipital region. Patient has pending assessment with physical medicine and rehab for a repeat nerve ablation. Is currently on Hydrocodone which she feels is subtherapeutic. Is having to take extra tylenol or ibuprofen to help with the pain. Denies new or worsening symptoms. Would like to discuss other options for pain until appt with specialist.   Past Medical History:  Diagnosis Date  . Anxiety   . Asthma   . Depression   . History of migraine    No issue in 2 years  . SVT (supraventricular tachycardia) (HCC)     Current Outpatient Medications on File Prior to Visit  Medication Sig Dispense Refill  . ARIPiprazole (ABILIFY) 2 MG tablet Take 1 tablet daily by mouth.    . cyclobenzaprine (FLEXERIL) 5 MG tablet Take 1 tablet (5 mg total) by mouth at bedtime as needed for muscle spasms. 30 tablet 1  . HYDROcodone-acetaminophen (NORCO/VICODIN) 5-325 MG tablet Take 1 tablet every 6 (six) hours as needed by mouth for moderate pain. 20 tablet 0  . Levonorgestrel-Ethinyl Estradiol (AMETHIA,CAMRESE) 0.15-0.03 &0.01 MG tablet Take 1 tablet by mouth daily. 1 Package 4  . nebivolol (BYSTOLIC) 5 MG tablet Take 5 mg by mouth daily.    . traZODone (DESYREL) 50 MG tablet Take 0.5 tablets (25 mg total) at bedtime as needed by mouth for sleep. 15 tablet 0  . Vilazodone HCl (VIIBRYD) 40 MG TABS Take 1 tablet (40 mg total) by mouth daily. 90 tablet 1   No current facility-administered medications on file prior to visit.     Allergies  Allergen Reactions  . Penicillins Nausea And Vomiting  . Sulfa Antibiotics Nausea And Vomiting    Family History  Problem Relation Age of Onset  . Hypertension Mother   . Hyperlipidemia Mother   . Suicidality Brother   . Angina Paternal Grandmother     Social History   Socioeconomic History  . Marital status: Married    Spouse name: None  . Number of children: None  . Years of  education: None  . Highest education level: None  Social Needs  . Financial resource strain: None  . Food insecurity - worry: None  . Food insecurity - inability: None  . Transportation needs - medical: None  . Transportation needs - non-medical: None  Occupational History  . None  Tobacco Use  . Smoking status: Never Smoker  . Smokeless tobacco: Never Used  Substance and Sexual Activity  . Alcohol use: No  . Drug use: No  . Sexual activity: Yes    Birth control/protection: Pill  Other Topics Concern  . None  Social History Narrative  . None    Review of Systems - See HPI.  All other ROS are negative.  BP 110/70   Pulse 68   Temp 97.9 F (36.6 C) (Oral)   Resp 14   Ht 5\' 5"  (1.651 m)   Wt 189 lb (85.7 kg)   SpO2 98%   BMI 31.45 kg/m   Physical Exam  Constitutional: She is well-developed, well-nourished, and in no distress.  HENT:  Head: Normocephalic and atraumatic.  Eyes: Conjunctivae are normal.  Neck: Normal range of motion and full passive range of motion without pain. No spinous process tenderness and no muscular tenderness present. Normal range of motion present.  Cardiovascular: Normal rate, regular rhythm, normal heart sounds and intact distal pulses.  Pulmonary/Chest: Effort normal and breath sounds normal. No  respiratory distress. She has no wheezes. She has no rales. She exhibits no tenderness.  Musculoskeletal:       Cervical back: She exhibits pain. She exhibits normal range of motion, no tenderness, no bony tenderness and no spasm.  Vitals reviewed.   Recent Results (from the past 2160 hour(s))  Iron, TIBC and Ferritin Panel     Status: None   Collection Time: 08/16/17 10:46 AM  Result Value Ref Range   Iron 67 40 - 190 mcg/dL   TIBC 161289 096250 - 045450 mcg/dL (calc)   %SAT 23 11 - 50 % (calc)   Ferritin 34 10 - 232 ng/mL    Assessment/Plan: 1. Cervicalgia Supportive measures reviewed. Will start trial of Gabapentin TID. Continue OTC medications.  Will titrate Gabapentin to therapeutic effect.   Piedad ClimesWilliam Cody Alejo Beamer, PA-C

## 2017-10-12 NOTE — Progress Notes (Signed)
Pre visit review using our clinic review tool, if applicable. No additional management support is needed unless otherwise documented below in the visit note. 

## 2017-10-13 ENCOUNTER — Encounter: Payer: Self-pay | Admitting: Physician Assistant

## 2017-10-14 ENCOUNTER — Other Ambulatory Visit: Payer: Self-pay | Admitting: Physician Assistant

## 2017-10-14 MED ORDER — OXYCODONE-ACETAMINOPHEN 5-325 MG PO TABS: 1.0000 | ORAL_TABLET | Freq: Three times a day (TID) | ORAL | 0 refills | Status: DC | PRN

## 2017-11-11 ENCOUNTER — Ambulatory Visit: Payer: Self-pay | Admitting: Obstetrics & Gynecology

## 2017-11-11 ENCOUNTER — Ambulatory Visit (INDEPENDENT_AMBULATORY_CARE_PROVIDER_SITE_OTHER): Admitting: Family Medicine

## 2017-11-11 ENCOUNTER — Encounter: Payer: Self-pay | Admitting: Family Medicine

## 2017-11-11 VITALS — BP 110/70 | HR 82 | Temp 98.5°F | Ht 65.0 in | Wt 192.0 lb

## 2017-11-11 DIAGNOSIS — R350 Frequency of micturition: Secondary | ICD-10-CM | POA: Diagnosis not present

## 2017-11-11 DIAGNOSIS — N393 Stress incontinence (female) (male): Secondary | ICD-10-CM | POA: Diagnosis not present

## 2017-11-11 LAB — POCT URINALYSIS DIPSTICK
Bilirubin, UA: NEGATIVE
Glucose, UA: NEGATIVE
Ketones, UA: NEGATIVE
LEUKOCYTES UA: NEGATIVE
Nitrite, UA: NEGATIVE
PH UA: 6 (ref 5.0–8.0)
PROTEIN UA: NEGATIVE
RBC UA: NEGATIVE
Spec Grav, UA: 1.02 (ref 1.010–1.025)
UROBILINOGEN UA: 0.2 U/dL

## 2017-11-11 NOTE — Progress Notes (Signed)
Subjective   CC:  Chief Complaint  Patient presents with  . Urinary Frequency    x 2 days    HPI: Cassandra Leach is a 46 y.o. female who presents to the office today to address the problems listed above in the chief complaint.  Patient reports urinary frequency.  She has sensation of increased urinary pressure.  She denies fevers flank pain nausea vomiting or gross hematuria or dysuria.  Symptoms have been present for 1 day. She denies history of interstitial cystitis.  She denies vaginal symptoms including vaginal discharge or pelvic pain. She drinks 3-4 cups of coffee per day. Admits to stress incont leakage at times but this just started. No h/o OAB.  I reviewed the patients updated PMH, FH, and SocHx.    Patient Active Problem List   Diagnosis Date Noted  . Primary insomnia 07/19/2017  . SVT (supraventricular tachycardia) (HCC) 04/01/2017  . Generalized anxiety disorder 04/01/2017  . Depression, major, single episode, mild (HCC) 04/01/2017   Current Meds  Medication Sig  . ARIPiprazole (ABILIFY) 2 MG tablet Take 2 tablets by mouth daily.   . cyclobenzaprine (FLEXERIL) 5 MG tablet TAKE 1 TABLET BY MOUTH AT BEDTIME AS NEEDED FOR MUSCLE SPASMS  . Levonorgestrel-Ethinyl Estradiol (AMETHIA,CAMRESE) 0.15-0.03 &0.01 MG tablet Take 1 tablet by mouth daily.  . nebivolol (BYSTOLIC) 5 MG tablet Take 5 mg by mouth daily.  Marland Kitchen. VIIBRYD 20 MG TABS TK 3 TS PO D    Review of Systems: Cardiovascular: negative for chest pain Respiratory: negative for SOB or persistent cough Gastrointestinal: negative for abdominal pain Constitutional: Negative for fever malaise or anorexia  Objective  Vitals: BP 110/70 (BP Location: Left Arm, Patient Position: Sitting, Cuff Size: Normal)   Pulse 82   Temp 98.5 F (36.9 C) (Oral)   Ht 5\' 5"  (1.651 m)   Wt 192 lb (87.1 kg)   LMP 09/01/2017   SpO2 96%   BMI 31.95 kg/m  General: no acute distress  Psych:  Alert and oriented, normal mood and  affect Cardiovascular:  RRR without murmur or gallop. no peripheral edema Respiratory:  Good breath sounds bilaterally, CTAB with normal respiratory effort Gastrointestinal: soft, flat abdomen, normal active bowel sounds, no palpable masses, no hepatosplenomegaly, no appreciated hernias, NO CVAT, No suprapubic ttp w/o rebound or guarding  Office Visit on 11/11/2017  Component Date Value Ref Range Status  . Color, UA 11/11/2017 yellow   Final  . Clarity, UA 11/11/2017 clear   Final  . Glucose, UA 11/11/2017 negative   Final  . Bilirubin, UA 11/11/2017 negative   Final  . Ketones, UA 11/11/2017 negative   Final  . Spec Grav, UA 11/11/2017 1.020  1.010 - 1.025 Final  . Blood, UA 11/11/2017 negative   Final  . pH, UA 11/11/2017 6.0  5.0 - 8.0 Final  . Protein, UA 11/11/2017 negative   Final  . Urobilinogen, UA 11/11/2017 0.2  0.2 or 1.0 E.U./dL Final  . Nitrite, UA 40/98/119112/28/2018 negative   Final  . Leukocytes, UA 11/11/2017 Negative  Negative Final    Assessment  1. Urinary frequency   2. SUI (stress urinary incontinence, female)      Plan   UA does not support UTI; frequency could be due to caffeine intake or mildly concentrated urine. Push water and monitor sxs. F/u if worsens.  Start kegels for mild SUI sxs.   Follow up: prn   Commons side effects, risks, benefits, and alternatives for medications and treatment plan prescribed  today were discussed, and the patient expressed understanding of the given instructions. Patient is instructed to call or message via MyChart if he/she has any questions or concerns regarding our treatment plan. No barriers to understanding were identified. We discussed Red Flag symptoms and signs in detail. Patient expressed understanding regarding what to do in case of urgent or emergency type symptoms.   Medication list was reconciled, printed and provided to the patient in AVS. Patient instructions and summary information was reviewed with the patient as  documented in the AVS. This note was prepared with assistance of Dragon voice recognition software. Occasional wrong-word or sound-a-like substitutions may have occurred due to the inherent limitations of voice recognition software  Orders Placed This Encounter  Procedures  . POCT urinalysis dipstick   No orders of the defined types were placed in this encounter.

## 2017-11-11 NOTE — Patient Instructions (Signed)
Please follow up if symptoms do not improve or as needed.   Drink plenty of water.  Start kegel exercises. Kegel Exercises Kegel exercises help strengthen the muscles that support the rectum, vagina, small intestine, bladder, and uterus. Doing Kegel exercises can help:  Improve bladder and bowel control.  Improve sexual response.  Reduce problems and discomfort during pregnancy.  Kegel exercises involve squeezing your pelvic floor muscles, which are the same muscles you squeeze when you try to stop the flow of urine. The exercises can be done while sitting, standing, or lying down, but it is best to vary your position. Phase 1 exercises 1. Squeeze your pelvic floor muscles tight. You should feel a tight lift in your rectal area. If you are a female, you should also feel a tightness in your vaginal area. Keep your stomach, buttocks, and legs relaxed. 2. Hold the muscles tight for up to 10 seconds. 3. Relax your muscles. Repeat this exercise 50 times a day or as many times as told by your health care provider. Continue to do this exercise for at least 4-6 weeks or for as long as told by your health care provider. This information is not intended to replace advice given to you by your health care provider. Make sure you discuss any questions you have with your health care provider. Document Released: 10/18/2012 Document Revised: 06/26/2016 Document Reviewed: 09/21/2015 Elsevier Interactive Patient Education  [employer]2018 [employer] Inc[employer]

## 2017-11-17 ENCOUNTER — Encounter: Payer: Self-pay | Admitting: Physician Assistant

## 2017-11-17 ENCOUNTER — Ambulatory Visit (INDEPENDENT_AMBULATORY_CARE_PROVIDER_SITE_OTHER): Admitting: Physician Assistant

## 2017-11-17 VITALS — BP 98/70 | HR 79 | Temp 98.2°F | Resp 14 | Ht 65.0 in | Wt 188.0 lb

## 2017-11-17 DIAGNOSIS — R350 Frequency of micturition: Secondary | ICD-10-CM

## 2017-11-17 DIAGNOSIS — R5383 Other fatigue: Secondary | ICD-10-CM | POA: Diagnosis not present

## 2017-11-17 LAB — POCT URINALYSIS DIPSTICK
Bilirubin, UA: NEGATIVE
Glucose, UA: NEGATIVE
Ketones, UA: NEGATIVE
LEUKOCYTES UA: NEGATIVE
NITRITE UA: NEGATIVE
PH UA: 5.5 (ref 5.0–8.0)
Protein, UA: NEGATIVE
SPEC GRAV UA: 1.015 (ref 1.010–1.025)
UROBILINOGEN UA: 0.2 U/dL

## 2017-11-17 LAB — URINALYSIS, MICROSCOPIC ONLY: RBC / HPF: NONE SEEN (ref 0–?)

## 2017-11-17 NOTE — Progress Notes (Signed)
Patient presents to clinic today c/o continued urinary urgency with some episodes of stress incontinence. Was seen last week by another provider with concerns for UTI. Workup was unremarkable for signs of infection. Was instructed to start Kegel exercises. Patient has been doing as directed without improvement in symptoms. Denies dysuria, suprapubic pressure, back pain/flank pain, nausea or vomiting. Denies vaginal symptoms.   Patient endorses ongoing fatigue despite exercise and well-balanced diet. Has history of depression, currently treated by psychiatry. Knows that part of the issue is related to her mood which is improving but is wanting to have labs checked today.   Past Medical History:  Diagnosis Date  . Anxiety   . Asthma   . Depression   . History of migraine    No issue in 2 years  . SVT (supraventricular tachycardia) ([facility])     Current Outpatient Medications on File Prior to Visit  Medication Sig Dispense Refill  . ARIPiprazole (ABILIFY) 2 MG tablet Take 2 tablets by mouth daily.     . Levonorgestrel-Ethinyl Estradiol (AMETHIA,CAMRESE) 0.15-0.03 &0.01 MG tablet Take 1 tablet by mouth daily. 1 Package 4  . nebivolol (BYSTOLIC) 5 MG tablet Take 5 mg by mouth daily.    . temazepam (RESTORIL) 15 MG capsule TK 1 C PO QHS MAY REPEAT PRN  2  . VIIBRYD 20 MG TABS TK 3 TS PO D  1  . cyclobenzaprine (FLEXERIL) 5 MG tablet TAKE 1 TABLET BY MOUTH AT BEDTIME AS NEEDED FOR MUSCLE SPASMS (Patient not taking: Reported on 11/17/2017) 30 tablet 0   No current facility-administered medications on file prior to visit.     Allergies  Allergen Reactions  . Penicillins Nausea And Vomiting  . Sulfa Antibiotics Nausea And Vomiting    Family History  Problem Relation Age of Onset  . Hypertension Mother   . Hyperlipidemia Mother   . Suicidality Brother   . Angina Paternal Grandmother     Social History   Socioeconomic History  . Marital status: Married    Spouse name: None  . Number of  children: None  . Years of education: None  . Highest education level: None  Social Needs  . Financial resource strain: None  . Food insecurity - worry: None  . Food insecurity - inability: None  . Transportation needs - medical: None  . Transportation needs - non-medical: None  Occupational History  . None  Tobacco Use  . Smoking status: Never Smoker  . Smokeless tobacco: Never Used  Substance and Sexual Activity  . Alcohol use: No  . Drug use: No  . Sexual activity: Yes    Birth control/protection: Pill  Other Topics Concern  . None  Social History Narrative  . None    Review of Systems - See HPI.  All other ROS are negative.  BP 98/70   Pulse 79   Temp 98.2 F (36.8 C) (Oral)   Resp 14   Ht '5\' 5"'  (1.651 m)   Wt 188 lb (85.3 kg)   SpO2 98%   BMI 31.28 kg/m   Physical Exam  Constitutional: She is well-developed, well-nourished, and in no distress.  HENT:  Head: Normocephalic and atraumatic.  Eyes: Conjunctivae are normal.  Neck: Neck supple.  Cardiovascular: Normal rate, regular rhythm, normal heart sounds and intact distal pulses.  Pulmonary/Chest: Effort normal and breath sounds normal. No respiratory distress. She has no wheezes. She has no rales. She exhibits no tenderness.  Abdominal: Soft. Bowel sounds are normal. She exhibits  no distension and no mass. There is no tenderness. There is no rebound and no guarding.  Skin: Skin is warm and dry. No rash noted.  Psychiatric: Affect normal.  Vitals reviewed.  Recent Results (from the past 2160 hour(s))  POCT urinalysis dipstick     Status: Normal   Collection Time: 11/11/17  1:44 PM  Result Value Ref Range   Color, UA yellow    Clarity, UA clear    Glucose, UA negative    Bilirubin, UA negative    Ketones, UA negative    Spec Grav, UA 1.020 1.010 - 1.025   Blood, UA negative    pH, UA 6.0 5.0 - 8.0   Protein, UA negative    Urobilinogen, UA 0.2 0.2 or 1.0 E.U./dL   Nitrite, UA negative    Leukocytes,  UA Negative Negative   Appearance     Odor     Assessment/Plan: 1. Urine frequency Urine dip unremarkable except for questionable + hemoglobin. Will check microscopic. Patient drinking a significant amount of caffeine throughout the day. Encouraged patient to cut back on caffeine as it can be a bladder irritant. Increase fluids. If workup negative and symptoms not improving with dietary change will want to start at trial of Vesicare or Myrbetriq versus setting patient up with Urology for urodynamic studies.  - POCT Urinalysis Dipstick - Urine Microscopic Only  2. Fatigue, unspecified type Unclear. Will check labs today.  - TSH - CBC w/Diff - Vitamin D (25 hydroxy) - B12 and Folate Panel - Comp Met (CMET) - Lipid panel    Leeanne Rio, PA-C

## 2017-11-17 NOTE — Patient Instructions (Signed)
Please schedule fasting lab appointment.  I will call with your results.  Please continue medications as directed by Psychiatry. I am sending urine for further testing. Follow recommendations below. Cut out the caffeine as it is a major bladder irritant. If not improving, we will need to set you up with urology and start medication.   Overactive Bladder, Adult Overactive bladder is a group of urinary symptoms. With overactive bladder, you may suddenly feel the need to pass urine (urinate) right away. After feeling this sudden urge, you might also leak urine if you cannot get to the bathroom fast enough (urinary incontinence). These symptoms might interfere with your daily work or social activities. Overactive bladder symptoms may also wake you up at night. Overactive bladder affects the nerve signals between your bladder and your brain. Your bladder may get the signal to empty before it is full. Very sensitive muscles can also make your bladder squeeze too soon. What are the causes? Many things can cause an overactive bladder. Possible causes include:  Urinary tract infection.  Infection of nearby tissues, such as the prostate.  Prostate enlargement.  Being pregnant with twins or more (multiples).  Surgery on the uterus or urethra.  Bladder stones, inflammation, or tumors.  Drinking too much caffeine or alcohol.  Certain medicines, especially those that you take to help your body get rid of extra fluid (diuretics) by increasing urine production.  Muscle or nerve weakness, especially from: ? A spinal cord injury. ? Stroke. ? Multiple sclerosis. ? Parkinson disease.  Diabetes. This can cause a high urine volume that fills the bladder so quickly that the normal urge to urinate is triggered very strongly.  Constipation. A buildup of too much stool can put pressure on your bladder.  What increases the risk? You may be at greater risk for overactive bladder if you:  Are an older  adult.  Smoke.  Are going through menopause.  Have prostate problems.  Have a neurological disease, such as stroke, dementia, Parkinson disease, or multiple sclerosis (MS).  Eat or drink things that irritate the bladder. These include alcohol, spicy food, and caffeine.  Are overweight or obese.  What are the signs or symptoms? The signs and symptoms of an overactive bladder include:  Sudden, strong urges to urinate.  Leaking urine.  Urinating eight or more times per day.  Waking up to urinate two or more times per night.  How is this diagnosed? Your health care provider may suspect overactive bladder based on your symptoms. The health care provider will do a physical exam and take your medical history. Blood or urine tests may also be done. For example, you might need to have a bladder function test to check how well you can hold your urine. You might also need to see a health care provider who specializes in the urinary tract (urologist). How is this treated? Treatment for overactive bladder depends on the cause of your condition and whether it is mild or severe. Certain treatments can be done in your health care provider's office or clinic. You can also make lifestyle changes at home. Options include: Behavioral Treatments  Biofeedback. A specialist uses sensors to help you become aware of your body's signals.  Keeping a daily log of when you need to urinate and what happens after the urge. This may help you manage your condition.  Bladder training. This helps you learn to control the urge to urinate by following a schedule that directs you to urinate at regular intervals (  timed voiding). At first, you might have to wait a few minutes after feeling the urge. In time, you should be able to schedule bathroom visits an hour or more apart.  Kegel exercises. These are exercises to strengthen the pelvic floor muscles, which support the bladder. Toning these muscles can help you  control urination, even if your bladder muscles are overactive. A specialist will teach you how to do these exercises correctly. They require daily practice.  Weight loss. If you are obese or overweight, losing weight might relieve your symptoms of overactive bladder. Talk to your health care provider about losing weight and whether there is a specific program or method that would work best for you.  Diet change. This might help if constipation is making your overactive bladder worse. Your health care provider or a dietitian can explain ways to change what you eat to ease constipation. You might also need to consume less alcohol and caffeine or drink other fluids at different times of the day.  Stopping smoking.  Wearing pads to absorb leakage while you wait for other treatments to take effect. Physical Treatments  Electrical stimulation. Electrodes send gentle pulses of electricity to strengthen the nerves or muscles that help to control the bladder. Sometimes, the electrodes are placed outside of the body. In other cases, they might be placed inside the body (implanted). This treatment can take several months to have an effect.  Supportive devices. Women may need a plastic device that fits into the vagina and supports the bladder (pessary). Medicines Several medicines can help treat overactive bladder and are usually used along with other treatments. Some are injected into the muscles involved in urination. Others come in pill form. Your health care provider may prescribe:  Antispasmodics. These medicines block the signals that the nerves send to the bladder. This keeps the bladder from releasing urine at the wrong time.  Tricyclic antidepressants. These types of antidepressants also relax bladder muscles.  Surgery  You may have a device implanted to help manage the nerve signals that indicate when you need to urinate.  You may have surgery to implant electrodes for electrical  stimulation.  Sometimes, very severe cases of overactive bladder require surgery to change the shape of the bladder. Follow these instructions at home:  Take medicines only as directed by your health care provider.  Use any implants or a pessary as directed by your health care provider.  Make any diet or lifestyle changes that are recommended by your health care provider. These might include: ? Drinking less fluid or drinking at different times of the day. If you need to urinate often during the night, you may need to stop drinking fluids early in the evening. ? Cutting down on caffeine or alcohol. Both can make an overactive bladder worse. Caffeine is found in coffee, tea, and sodas. ? Doing Kegel exercises to strengthen muscles. ? Losing weight if you need to. ? Eating a healthy and balanced diet to prevent constipation.  Keep a journal or log to track how much and when you drink and also when you feel the need to urinate. This will help your health care provider to monitor your condition. Contact a health care provider if:  Your symptoms do not get better after treatment.  Your pain and discomfort are getting worse.  You have more frequent urges to urinate.  You have a fever. Get help right away if: You are not able to control your bladder at all. This information is not  intended to replace advice given to you by your health care provider. Make sure you discuss any questions you have with your health care provider. Document Released: 08/28/2009 Document Revised: 04/08/2016 Document Reviewed: 03/27/2014 Elsevier Interactive Patient Education  [employer].

## 2017-11-18 ENCOUNTER — Other Ambulatory Visit (INDEPENDENT_AMBULATORY_CARE_PROVIDER_SITE_OTHER)

## 2017-11-18 ENCOUNTER — Encounter: Payer: Self-pay | Admitting: Cardiology

## 2017-11-18 ENCOUNTER — Ambulatory Visit (INDEPENDENT_AMBULATORY_CARE_PROVIDER_SITE_OTHER): Admitting: Cardiology

## 2017-11-18 VITALS — BP 112/72 | HR 64 | Ht 65.0 in | Wt 189.8 lb

## 2017-11-18 DIAGNOSIS — I471 Supraventricular tachycardia: Secondary | ICD-10-CM

## 2017-11-18 DIAGNOSIS — R5383 Other fatigue: Secondary | ICD-10-CM | POA: Diagnosis not present

## 2017-11-18 DIAGNOSIS — E663 Overweight: Secondary | ICD-10-CM

## 2017-11-18 DIAGNOSIS — R002 Palpitations: Secondary | ICD-10-CM

## 2017-11-18 LAB — COMPREHENSIVE METABOLIC PANEL
ALT: 9 U/L (ref 0–35)
AST: 14 U/L (ref 0–37)
Albumin: 4.3 g/dL (ref 3.5–5.2)
Alkaline Phosphatase: 31 U/L — ABNORMAL LOW (ref 39–117)
BUN: 9 mg/dL (ref 6–23)
CALCIUM: 9.1 mg/dL (ref 8.4–10.5)
CHLORIDE: 103 meq/L (ref 96–112)
CO2: 25 meq/L (ref 19–32)
CREATININE: 0.84 mg/dL (ref 0.40–1.20)
GFR: 76.68 mL/min (ref >60.00)
Glucose, Bld: 96 mg/dL (ref 70–99)
POTASSIUM: 4.1 meq/L (ref 3.5–5.1)
Sodium: 137 mEq/L (ref 135–145)
Total Bilirubin: 0.5 mg/dL (ref 0.2–1.2)
Total Protein: 7 g/dL (ref 6.0–8.3)

## 2017-11-18 LAB — CBC WITH DIFFERENTIAL/PLATELET
Basophils Absolute: 0.1 10*3/uL (ref 0.0–0.1)
Basophils Relative: 1 % (ref 0.0–3.0)
EOS ABS: 0.1 10*3/uL (ref 0.0–0.7)
Eosinophils Relative: 1.2 % (ref 0.0–5.0)
HCT: 42.2 % (ref 36.0–46.0)
Hemoglobin: 14 g/dL (ref 12.0–15.0)
LYMPHS ABS: 1.9 10*3/uL (ref 0.7–4.0)
Lymphocytes Relative: 31.4 % (ref 12.0–46.0)
MCHC: 33.3 g/dL (ref 30.0–36.0)
MCV: 92 fl (ref 78.0–100.0)
MONO ABS: 0.4 10*3/uL (ref 0.1–1.0)
Monocytes Relative: 6.4 % (ref 3.0–12.0)
NEUTROS PCT: 60 % (ref 43.0–77.0)
Neutro Abs: 3.6 10*3/uL (ref 1.4–7.7)
Platelets: 311 10*3/uL (ref 150.0–400.0)
RBC: 4.59 Mil/uL (ref 3.87–5.11)
RDW: 13 % (ref 11.5–15.5)
WBC: 6.1 10*3/uL (ref 4.0–10.5)

## 2017-11-18 LAB — LIPID PANEL
CHOL/HDL RATIO: 5
CHOLESTEROL: 181 mg/dL (ref 0–200)
HDL: 38.7 mg/dL — ABNORMAL LOW (ref >39.00)
LDL CALC: 121 mg/dL — AB (ref 0–99)
NonHDL: 142.68
Triglycerides: 107 mg/dL (ref 0.0–149.0)
VLDL: 21.4 mg/dL (ref 0.0–40.0)

## 2017-11-18 LAB — B12 AND FOLATE PANEL
Folate: 19.8 ng/mL (ref >5.9)
VITAMIN B 12: 490 pg/mL (ref 211–911)

## 2017-11-18 LAB — VITAMIN D 25 HYDROXY (VIT D DEFICIENCY, FRACTURES): VITD: 25.48 ng/mL — AB (ref 30.00–100.00)

## 2017-11-18 LAB — TSH: TSH: 2.52 u[IU]/mL (ref 0.35–4.50)

## 2017-11-18 MED ORDER — NEBIVOLOL HCL 2.5 MG PO TABS
2.5000 mg | ORAL_TABLET | Freq: Every day | ORAL | 3 refills | Status: DC
Start: 2017-11-18 — End: 2017-12-22

## 2017-11-18 NOTE — Progress Notes (Signed)
Cardiology Office Note:    Date:  11/18/2017   ID:  Cassandra Leach, DOB September 11, 1971, MRN [id number]  PCP:  Waldon MerlMartin, William C, PA-C  Cardiologist:  Tobias AlexanderKatarina Norelle Runnion, MD   Referring MD: Waldon MerlMartin, William C, PA-C   Chief complain: SVT  History of Present Illness:    Cassandra Leach is a 47 y.o. female with a hx of h/o asthma, SVT who [place] the [place] [place]. She is originally from here and also seeing her mother-in-law. She is generally very healthy, has history of asthma for which she uses albuterol when necessary. Approximately year ago she developed palpitations associated with dizziness and cold EMS, her heart rate on EMS arrival was 220, with Valsalva maneuver decreased to 150, and then resolved on the way to the hospital. She was diagnosed with SVT and started on 2 different beta blockers that she didn't tolerate including metoprolol that made her nauseated. She was then started on by systolic and hasn't had any episodes since then. She states that in her 920s and 30s she was runner and would run about 5 miles a day with no symptoms. Over the last few year she has been struggling with weight and went up from 122 -> 210 pounds, then lost to 175 on ketogenic diet, however that wasn't sustainable so she quit in gain weight again. She denies any dizziness or falls, no lower extremity edema orthopnea or proximal nocturnal dyspnea. She doesn't have a family history of premature coronary artery disease.  Past Medical History:  Diagnosis Date  . Anxiety   . Asthma   . Depression   . History of migraine    No issue in 2 years  . SVT (supraventricular tachycardia) (HCC)     Past Surgical History:  Procedure Laterality Date  . RADIOFREQUENCY ABLATION NERVES      Current Medications: Current Meds  Medication Sig  . ARIPiprazole (ABILIFY) 2 MG tablet Take 2 tablets by mouth daily.   . cyclobenzaprine (FLEXERIL) 5 MG tablet TAKE 1 TABLET BY MOUTH AT BEDTIME AS NEEDED FOR  MUSCLE SPASMS  . Levonorgestrel-Ethinyl Estradiol (AMETHIA,CAMRESE) 0.15-0.03 &0.01 MG tablet Take 1 tablet by mouth daily.  . temazepam (RESTORIL) 15 MG capsule TK 1 C PO QHS MAY REPEAT PRN  . VIIBRYD 20 MG TABS TK 3 TS PO D  . [DISCONTINUED] nebivolol (BYSTOLIC) 5 MG tablet Take 5 mg by mouth daily.     Allergies:   Penicillins and Sulfa antibiotics   Social History   Socioeconomic History  . Marital status: Married    Spouse name: None  . Number of children: None  . Years of education: None  . Highest education level: None  Social Needs  . Financial resource strain: None  . Food insecurity - worry: None  . Food insecurity - inability: None  . Transportation needs - medical: None  . Transportation needs - non-medical: None  Occupational History  . None  Tobacco Use  . Smoking status: Never Smoker  . Smokeless tobacco: Never Used  Substance and Sexual Activity  . Alcohol use: No  . Drug use: No  . Sexual activity: Yes    Birth control/protection: Pill  Other Topics Concern  . None  Social History Narrative  . None     Family History: The patient's family history includes Angina in her paternal grandmother; Hyperlipidemia in her mother; Hypertension in her mother; Suicidality in her brother. ROS:   Please see the history of present illness.  All other systems reviewed and are negative.  EKGs/Labs/Other Studies Reviewed:    The following studies were reviewed today:  EKG:  EKG is ordered today.  The ekg ordered today demonstrates normal sinus rhythm, normal EKG, this was personally reviewed.  Recent Labs: 04/01/2017: TSH 1.89  Recent Lipid Panel No results found for: CHOL, TRIG, HDL, CHOLHDL, VLDL, LDLCALC, LDLDIRECT  Physical Exam:    VS:  BP 112/72 (BP Location: Right Arm, Patient Position: Sitting, Cuff Size: Normal)   Pulse 64   Ht 5\' 5"  (1.651 m)   Wt 189 lb 12.8 oz (86.1 kg)   SpO2 97%   BMI 31.58 kg/m     Wt Readings from Last 3 Encounters:    11/18/17 189 lb 12.8 oz (86.1 kg)  11/17/17 188 lb (85.3 kg)  11/11/17 192 lb (87.1 kg)    GEN: Well nourished, well developed in no acute distress HEENT: Normal NECK: No JVD; No carotid bruits LYMPHATICS: No lymphadenopathy CARDIAC: RRR, no murmurs, rubs, gallops RESPIRATORY:  Clear to auscultation without rales, wheezing or rhonchi  ABDOMEN: Soft, non-tender, non-distended MUSCULOSKELETAL:  No edema; No deformity  SKIN: Warm and dry NEUROLOGIC:  Alert and oriented x 3 PSYCHIATRIC:  Normal affect   ASSESSMENT:    1. SVT (supraventricular tachycardia) (HCC)   2. Heart palpitations   3. Overweight (BMI 25.0-29.9)    PLAN:    In order of problems listed above:  1. I have reviewed patient's echocardiogram report, she has normal LVEF 60-65% and only mild mitral regurgitation. I would continue by systolic however decreased dose given her blood pressure and lack of symptoms. She is advised to call us if she has recurrent episodes of palpitations. She is also educated how to perform Valsalva maneuvers in case she gets palpitations. 2. Overweight - the patient struggles with losing weight, and was prediabetic couple years ago as a result of it, she is explained in details the necessity of exercise, she is very motivated to start exercising again, she has been walking recently. She is also explained differences between different diets including plan based diet, Mediterranean-style diet and in ketogenic diet she is advised to follow Mediterranean-style diet and limiting processed food fried foods and eat as much of fresh vegetables is possible she is very excited about that. She was drawn labs today I will follow her TSH considering her weight increase. 3. Lipids, all of her labs have been checked today we will obtain them from her primary care physician.   Medication Adjustments/Labs and Tests Ordered: Current medicines are reviewed at length with the patient today.  Concerns regarding  medicines are outlined above.  Orders Placed This Encounter  Procedures  . EKG 12-Lead   Meds ordered this encounter  Medications  . nebivolol (BYSTOLIC) 2.5 MG tablet    Sig: Take 1 tablet (2.5 mg total) by mouth daily.    Dispense:  90 tablet    Refill:  3    Signed, Tobias Alexander, MD  11/18/2017 11:19 AM    [facility]

## 2017-11-18 NOTE — Patient Instructions (Signed)
Medication Instructions:   DECREASE YOUR BYSTOLIC TO 2.5 MG ONCE DAILY    Follow-Up:  Your physician wants you to follow-up in: 6 MONTH FOLLOW-UP WITH DR Johnell ComingsNELSON You will receive a reminder letter in the mail two months in advance. If you don't receive a letter, please call our office to schedule the follow-up appointment.        If you need a refill on your cardiac medications before your next appointment, please call your pharmacy.

## 2017-11-21 ENCOUNTER — Other Ambulatory Visit: Payer: Self-pay | Admitting: *Deleted

## 2017-11-21 DIAGNOSIS — E559 Vitamin D deficiency, unspecified: Secondary | ICD-10-CM

## 2017-11-21 MED ORDER — VITAMIN D (ERGOCALCIFEROL) 1.25 MG (50000 UNIT) PO CAPS: 50000.0000 [IU] | ORAL_CAPSULE | ORAL | 0 refills | Status: DC

## 2017-11-22 ENCOUNTER — Ambulatory Visit: Payer: Self-pay | Admitting: Obstetrics & Gynecology

## 2017-12-01 ENCOUNTER — Ambulatory Visit (HOSPITAL_COMMUNITY): Payer: Self-pay | Admitting: Psychiatry

## 2017-12-21 ENCOUNTER — Telehealth: Payer: Self-pay | Admitting: Cardiology

## 2017-12-21 NOTE — Telephone Encounter (Signed)
New Message   STAT if HR is under 50 or over 120 (normal HR is 60-100 beats per minute)  1) What is your heart rate? 100  2) Do you have a log of your heart rate readings (document readings)? No    Do you have any other symptoms? No other symptoms.    Patients states that today her HR has continuously been going in the 100+ range.

## 2017-12-21 NOTE — Telephone Encounter (Signed)
Yes, increase back to 5 mg po daily

## 2017-12-21 NOTE — Telephone Encounter (Signed)
Pt calling to inform Dr Delton SeeNelson that ever since she decreased her bystolic from 5 mg to 2.5 mg po daily, she has noticed her HR in the upper 90s to low 100 range.  Pt states she is asymptomatic with her tachycardia.  Pt states she does do Valsava Maneuver, and this helps short term, then her HR jumps back to upper 90s to 100 bpm.  Pt states that she has always had a low normal BP reading, and that's why Dr Delton SeeNelson decreased her Bystolic.  Pt states she is plenty hydrated and she has a daily exercise regimen.  Pt states she drinks coffee, but no other caffeine. Pt would like for Dr Delton SeeNelson to advise if she can increase her Bystolic back to 5 mg po daily, for the pt states she was on this for 2 years, and never had any issues with her HR.  Pt is afraid of going back into SVT.  Informed the pt that Dr Delton SeeNelson is out of the office today, but I will route this message to her for further review and recommendation, and follow-up with her accordingly thereafter.  Advised the pt to limit her coffee intake, continue hydrating well and utilizing Valsava Maneuver as needed.  Pt verbalized understanding and agrees with this plan.

## 2017-12-22 MED ORDER — NEBIVOLOL HCL 5 MG PO TABS: 5.0000 mg | ORAL_TABLET | Freq: Every day | ORAL | 2 refills | Status: DC

## 2017-12-22 NOTE — Telephone Encounter (Signed)
Spoke with the pt and informed her that Dr Delton SeeNelson advised that we can increase her Bystolic to 5 mg po daily.  Confirmed the pharmacy of choice with the pt.  Pt verbalized understanding and agrees with this plan.

## 2017-12-27 ENCOUNTER — Other Ambulatory Visit: Payer: Self-pay | Admitting: Emergency Medicine

## 2017-12-27 ENCOUNTER — Encounter: Payer: Self-pay | Admitting: Physician Assistant

## 2017-12-27 ENCOUNTER — Other Ambulatory Visit: Payer: Self-pay

## 2017-12-27 ENCOUNTER — Ambulatory Visit (INDEPENDENT_AMBULATORY_CARE_PROVIDER_SITE_OTHER): Admitting: Physician Assistant

## 2017-12-27 ENCOUNTER — Other Ambulatory Visit: Payer: Self-pay | Admitting: Physician Assistant

## 2017-12-27 VITALS — BP 108/70 | HR 67 | Temp 97.9°F | Resp 14 | Ht 65.0 in | Wt 188.0 lb

## 2017-12-27 DIAGNOSIS — F5101 Primary insomnia: Secondary | ICD-10-CM | POA: Diagnosis not present

## 2017-12-27 DIAGNOSIS — F320 Major depressive disorder, single episode, mild: Secondary | ICD-10-CM | POA: Diagnosis not present

## 2017-12-27 MED ORDER — VIIBRYD 20 MG PO TABS: ORAL_TABLET | ORAL | 1 refills | Status: DC

## 2017-12-27 MED ORDER — ARIPIPRAZOLE 1 MG/ML PO SOLN: ORAL | 0 refills | Status: DC

## 2017-12-27 NOTE — Telephone Encounter (Signed)
Viibryd medication sent to the mail order pharmacy.

## 2017-12-27 NOTE — Assessment & Plan Note (Signed)
Will continue Viibryd at current dose. Will decrease Abilify to 0.5 mg daily x 2 weeks. Then decrease to 0.25 mg daily x 2 weeks before completely stopping.  Close follow-up scheduled.

## 2017-12-27 NOTE — Telephone Encounter (Signed)
Copied from CRM [phone]. Topic: Quick Communication - Rx Refill/Question >> Dec 27, 2017  1:10 PM Alexander BergeronBarksdale, Harvey B wrote: Medication:  VIIBRYD 20 MG TABS [[id number]][[id number]]   Has the patient contacted their pharmacy? Yes.     (Agent: If no, request that the patient contact the pharmacy for the refill.)   Preferred Pharm[facility] (with phone number [facility]   Agent: Please be advised that RX refills may take up to 3 business days. We ask that you follow-up with your pharmacy.

## 2017-12-27 NOTE — Assessment & Plan Note (Signed)
Continue Restoril at current dose.  Reviewed sleep hygiene practices with patient.

## 2017-12-27 NOTE — Patient Instructions (Signed)
Start weaning down the Abilify -- 0.5 mg once daily x 2 weeks, then decreasing to 0.25 mg once daily x 2 weeks before stopping.   Continue other medications as directed. Follow-up in 3-4 weeks so we can work on titrating down on other medications.

## 2017-12-28 NOTE — Progress Notes (Signed)
Patient presents to clinic today to discuss medication management for anxiety/depression. Patient has been followed by Psychiatry. Is currently on a regimen of Abilify (weaned down to 1 mg daily from 4 mg daily), Viibryd 60 mg daily and Restoril 15 mg nightly for sleep. Has been having magnetic stimulation therapy which she states has been remarkable for her mood. Denies anxiety, depressed mood. Sleep is well-controlled with Restoril. Was trying to wean further off the Abilify but states her Psychiatrist was very rude and dismissive when she asked for a liquid dose to help wean down to 0.5 mg before just stopping due to history with issues weaning off of Abilify. States it made her feel uncomfortable.  Past Medical History:  Diagnosis Date  . Anxiety   . Asthma   . Depression   . History of migraine    No issue in 2 years  . SVT (supraventricular tachycardia) ([place])     Current Outpatient Medications on File Prior to Visit  Medication Sig Dispense Refill  . Levonorgestrel-Ethinyl Estradiol (AMETHIA,CAMRESE) 0.15-0.03 &0.01 MG tablet Take 1 tablet by mouth daily. 1 Package 4  . nebivolol (BYSTOLIC) 5 MG tablet Take 1 tablet (5 mg total) by mouth daily. 90 tablet 2  . temazepam (RESTORIL) 15 MG capsule TK 1 C PO QHS MAY REPEAT PRN  2  . Vitamin D, Ergocalciferol, (DRISDOL) 50000 units CAPS capsule Take 1 capsule (50,000 Units total) by mouth every 7 (seven) days. 10 capsule 0   No current facility-administered medications on file prior to visit.     Allergies  Allergen Reactions  . Penicillins Nausea And Vomiting  . Sulfa Antibiotics Nausea And Vomiting    Family History  Problem Relation Age of Onset  . Hypertension Mother   . Hyperlipidemia Mother   . Suicidality Brother   . Angina Paternal Grandmother     Social History   Socioeconomic History  . Marital status: Married    Spouse name: None  . Number of children: None  . Years of education: None  . Highest education  level: None  Social Needs  . Financial resource strain: None  . Food insecurity - worry: None  . Food insecurity - inability: None  . Transportation needs - medical: None  . Transportation needs - non-medical: None  Occupational History  . None  Tobacco Use  . Smoking status: Never Smoker  . Smokeless tobacco: Never Used  Substance and Sexual Activity  . Alcohol use: No  . Drug use: No  . Sexual activity: Yes    Birth control/protection: Pill  Other Topics Concern  . None  Social History Narrative  . None   Review of Systems - See HPI.  All other ROS are negative.  BP 108/70   Pulse 67   Temp 97.9 F (36.6 C) (Oral)   Resp 14   Ht _0  (1.651 m)   Wt 188 lb (85.3 kg)   SpO2 98%   BMI 31.28 kg/m   Physical Exam  Constitutional: She is oriented to person, place, and time and well-developed, well-nourished, and in no distress.  HENT:  Head: Normocephalic and atraumatic.  Eyes: Conjunctivae are normal.  Cardiovascular: Normal rate, regular rhythm, normal heart sounds and intact distal pulses.  Pulmonary/Chest: Effort normal.  Neurological: She is alert and oriented to person, place, and time.  Skin: Skin is warm and dry. No rash noted.  Psychiatric: Affect normal.  Vitals reviewed.  Recent Results (from the past 2160 hour(s))  POCT urinalysis  dipstick     Status: Normal   Collection Time: 11/11/17  1:44 PM  Result Value Ref Range   Color, UA yellow    Clarity, UA clear    Glucose, UA negative    Bilirubin, UA negative    Ketones, UA negative    Spec Grav, UA 1.020 1.010 - 1.025   Blood, UA negative    pH, UA 6.0 5.0 - 8.0   Protein, UA negative    Urobilinogen, UA 0.2 0.2 or 1.0 E.U./dL   Nitrite, UA negative    Leukocytes, UA Negative Negative   Appearance     Odor    POCT Urinalysis Dipstick     Status: Abnormal   Collection Time: 11/17/17 12:08 PM  Result Value Ref Range   Color, UA yellow    Clarity, UA clear    Glucose, UA negative     Bilirubin, UA negative    Ketones, UA negative    Spec Grav, UA 1.015 1.010 - 1.025   Blood, UA trace    pH, UA 5.5 5.0 - 8.0   Protein, UA negative    Urobilinogen, UA 0.2 0.2 or 1.0 E.U./dL   Nitrite, UA negative    Leukocytes, UA Negative Negative   Appearance     Odor    Urine Microscopic Only     Status: Abnormal   Collection Time: 11/17/17  1:10 PM  Result Value Ref Range   WBC, UA 0-2/hpf 0-2/hpf   RBC / HPF none seen 0-2/hpf   Squamous Epithelial / LPF Few(5-10/hpf) (A) Rare(0-4/hpf)  TSH     Status: None   Collection Time: 11/18/17  8:01 AM  Result Value Ref Range   TSH 2.52 0.35 - 4.50 uIU/mL  CBC w/Diff     Status: None   Collection Time: 11/18/17  8:01 AM  Result Value Ref Range   WBC 6.1 4.0 - 10.5 K/uL   RBC 4.59 3.87 - 5.11 Mil/uL   Hemoglobin 14.0 12.0 - 15.0 g/dL   HCT 42.2 36.0 - 46.0 %   MCV 92.0 78.0 - 100.0 fl   MCHC 33.3 30.0 - 36.0 g/dL   RDW 13.0 11.5 - 15.5 %   Platelets 311.0 150.0 - 400.0 K/uL   Neutrophils Relative % 60.0 43.0 - 77.0 %   Lymphocytes Relative 31.4 12.0 - 46.0 %   Monocytes Relative 6.4 3.0 - 12.0 %   Eosinophils Relative 1.2 0.0 - 5.0 %   Basophils Relative 1.0 0.0 - 3.0 %   Neutro Abs 3.6 1.4 - 7.7 K/uL   Lymphs Abs 1.9 0.7 - 4.0 K/uL   Monocytes Absolute 0.4 0.1 - 1.0 K/uL   Eosinophils Absolute 0.1 0.0 - 0.7 K/uL   Basophils Absolute 0.1 0.0 - 0.1 K/uL  Vitamin D (25 hydroxy)     Status: Abnormal   Collection Time: 11/18/17  8:01 AM  Result Value Ref Range   VITD 25.48 (L) 30.00 - 100.00 ng/mL  B12 and Folate Panel     Status: None   Collection Time: 11/18/17  8:01 AM  Result Value Ref Range   Vitamin B-12 490 211 - 911 pg/mL   Folate 19.8 >5.9 ng/mL  Comp Met (CMET)     Status: Abnormal   Collection Time: 11/18/17  8:01 AM  Result Value Ref Range   Sodium 137 135 - 145 mEq/L   Potassium 4.1 3.5 - 5.1 mEq/L   Chloride 103 96 - 112 mEq/L   CO2 25 19 -  32 mEq/L   Glucose, Bld 96 70 - 99 mg/dL   BUN 9 6 - 23 mg/dL     Creatinine, Ser 0.84 0.40 - 1.20 mg/dL   Total Bilirubin 0.5 0.2 - 1.2 mg/dL   Alkaline Phosphatase 31 (L) 39 - 117 U/L   AST 14 0 - 37 U/L   ALT 9 0 - 35 U/L   Total Protein 7.0 6.0 - 8.3 g/dL   Albumin 4.3 3.5 - 5.2 g/dL   Calcium 9.1 8.4 - 10.5 mg/dL   GFR 76.68 >60.00 mL/min  Lipid panel     Status: Abnormal   Collection Time: 11/18/17  8:01 AM  Result Value Ref Range   Cholesterol 181 0 - 200 mg/dL    Comment: ATP III Classification       Desirable:  < 200 mg/dL               Borderline High:  200 - 239 mg/dL          High:  > = 240 mg/dL   Triglycerides 107.0 0.0 - 149.0 mg/dL    Comment: Normal:  <150 mg/dLBorderline High:  150 - 199 mg/dL   HDL 38.70 (L) >39.00 mg/dL   VLDL 21.4 0.0 - 40.0 mg/dL   LDL Cholesterol 121 (H) 0 - 99 mg/dL   Total CHOL/HDL Ratio 5     Comment:                Men          Women1/2 Average Risk     3.4          3.3Average Risk          5.0          4.42X Average Risk          9.6          7.13X Average Risk          15.0          11.0                       NonHDL 142.68     Comment: NOTE:  Non-HDL goal should be 30 mg/dL higher than patient's LDL goal (i.e. LDL goal of < 70 mg/dL, would have non-HDL goal of < 100 mg/dL)    Assessment/Plan: Primary insomnia Continue Restoril at current dose.  Reviewed sleep hygiene practices with patient.   Depression, major, single episode, mild ([place]) Will continue Viibryd at current dose. Will decrease Abilify to 0.5 mg daily x 2 weeks. Then decrease to 0.25 mg daily x 2 weeks before completely stopping.  Close follow-up scheduled.     Leeanne Rio, PA-C

## 2018-01-03 ENCOUNTER — Encounter: Payer: Self-pay | Admitting: Physician Assistant

## 2018-01-03 ENCOUNTER — Other Ambulatory Visit: Payer: Self-pay

## 2018-01-03 ENCOUNTER — Ambulatory Visit (INDEPENDENT_AMBULATORY_CARE_PROVIDER_SITE_OTHER): Admitting: Physician Assistant

## 2018-01-03 VITALS — BP 102/70 | HR 71 | Temp 97.8°F | Resp 14 | Ht 65.0 in | Wt 189.0 lb

## 2018-01-03 DIAGNOSIS — G44209 Tension-type headache, unspecified, not intractable: Secondary | ICD-10-CM

## 2018-01-03 MED ORDER — KETOROLAC TROMETHAMINE 60 MG/2ML IM SOLN
60.0000 mg | Freq: Once | INTRAMUSCULAR | Status: AC
  Administered 2018-01-03: 60 mg via INTRAMUSCULAR

## 2018-01-03 MED ORDER — TIZANIDINE HCL 2 MG PO TABS: 2.0000 mg | ORAL_TABLET | Freq: Three times a day (TID) | ORAL | 0 refills | Status: DC | PRN

## 2018-01-03 MED ORDER — MELOXICAM 7.5 MG PO TABS: 7.5000 mg | ORAL_TABLET | Freq: Every day | ORAL | 0 refills | Status: DC

## 2018-01-03 NOTE — Progress Notes (Signed)
Patient presents to clinic today c/o 1 day of posterior headache with muscle tension in neck and shoulders. Notes pain is a pulling sensation. Denies trauma or injury. Denies change in stress levels. Denies any photophobia, phonophobia, nausea/viomiting. Denies radiation of pain elsewhere. Denies fever, chills, malaise or fatigue. Has taken some Ibuprofen last night that helped some.   Past Medical History:  Diagnosis Date  . Anxiety   . Asthma   . Depression   . History of migraine    No issue in 2 years  . SVT (supraventricular tachycardia) ([place])     Current Outpatient Medications on File Prior to Visit  Medication Sig Dispense Refill  . Aripiprazole 1 MG/ML mL Take 0.5 mLs (0.5 mg total) by mouth daily for 14 days, THEN 0.25 mLs (0.25 mg total) daily for 14 days. 15 mL 0  . nebivolol (BYSTOLIC) 5 MG tablet Take 1 tablet (5 mg total) by mouth daily. 90 tablet 2  . temazepam (RESTORIL) 15 MG capsule TK 1 C PO QHS MAY REPEAT PRN  2  . VIIBRYD 20 MG TABS Take 3 tablets by mouth daily 270 tablet 1  . Vitamin D, Ergocalciferol, (DRISDOL) 50000 units CAPS capsule Take 1 capsule (50,000 Units total) by mouth every 7 (seven) days. 10 capsule 0  . Levonorgestrel-Ethinyl Estradiol (AMETHIA,CAMRESE) 0.15-0.03 &0.01 MG tablet Take 1 tablet by mouth daily. (Patient not taking: Reported on 01/03/2018) 1 Package 4   No current facility-administered medications on file prior to visit.     Allergies  Allergen Reactions  . Penicillins Nausea And Vomiting  . Sulfa Antibiotics Nausea And Vomiting    Family History  Problem Relation Age of Onset  . Hypertension Mother   . Hyperlipidemia Mother   . Suicidality Brother   . Angina Paternal Grandmother     Social History   Socioeconomic History  . Marital status: Married    Spouse name: None  . Number of children: None  . Years of education: None  . Highest education level: None  Social Needs  . Financial resource strain: None  . Food  insecurity - worry: None  . Food insecurity - inability: None  . Transportation needs - medical: None  . Transportation needs - non-medical: None  Occupational History  . None  Tobacco Use  . Smoking status: Never Smoker  . Smokeless tobacco: Never Used  Substance and Sexual Activity  . Alcohol use: No  . Drug use: No  . Sexual activity: Yes    Birth control/protection: Pill  Other Topics Concern  . None  Social History Narrative  . None    Review of Systems - See HPI.  All other ROS are negative.  BP 102/70   Pulse 71   Temp 97.8 F (36.6 C) (Oral)   Resp 14   Ht '5\' 5"'  (1.651 m)   Wt 189 lb (85.7 kg)   SpO2 98%   BMI 31.45 kg/m   Physical Exam  Constitutional: She is oriented to person, place, and time and well-developed, well-nourished, and in no distress.  HENT:  Head: Normocephalic and atraumatic.  Right Ear: Tympanic membrane normal.  Left Ear: Tympanic membrane normal.  Nose: Nose normal.  Mouth/Throat: Uvula is midline, oropharynx is clear and moist and mucous membranes are normal.  Eyes: Conjunctivae are normal. Pupils are equal, round, and reactive to light.  Neck: Normal range of motion. Neck supple. Muscular tenderness present. No spinous process tenderness present. Normal range of motion present.  Cardiovascular: Normal  rate, regular rhythm, normal heart sounds and intact distal pulses.  Pulmonary/Chest: Effort normal. No respiratory distress. She has no wheezes. She has no rales. She exhibits no tenderness.  Lymphadenopathy:    She has no cervical adenopathy.  Neurological: She is alert and oriented to person, place, and time.  Skin: Skin is warm and dry. No rash noted.  Psychiatric: Affect normal.  Vitals reviewed.   Recent Results (from the past 2160 hour(s))  POCT urinalysis dipstick     Status: Normal   Collection Time: 11/11/17  1:44 PM  Result Value Ref Range   Color, UA yellow    Clarity, UA clear    Glucose, UA negative    Bilirubin, UA  negative    Ketones, UA negative    Spec Grav, UA 1.020 1.010 - 1.025   Blood, UA negative    pH, UA 6.0 5.0 - 8.0   Protein, UA negative    Urobilinogen, UA 0.2 0.2 or 1.0 E.U./dL   Nitrite, UA negative    Leukocytes, UA Negative Negative   Appearance     Odor    POCT Urinalysis Dipstick     Status: Abnormal   Collection Time: 11/17/17 12:08 PM  Result Value Ref Range   Color, UA yellow    Clarity, UA clear    Glucose, UA negative    Bilirubin, UA negative    Ketones, UA negative    Spec Grav, UA 1.015 1.010 - 1.025   Blood, UA trace    pH, UA 5.5 5.0 - 8.0   Protein, UA negative    Urobilinogen, UA 0.2 0.2 or 1.0 E.U./dL   Nitrite, UA negative    Leukocytes, UA Negative Negative   Appearance     Odor    Urine Microscopic Only     Status: Abnormal   Collection Time: 11/17/17  1:10 PM  Result Value Ref Range   WBC, UA 0-2/hpf 0-2/hpf   RBC / HPF none seen 0-2/hpf   Squamous Epithelial / LPF Few(5-10/hpf) (A) Rare(0-4/hpf)  TSH     Status: None   Collection Time: 11/18/17  8:01 AM  Result Value Ref Range   TSH 2.52 0.35 - 4.50 uIU/mL  CBC w/Diff     Status: None   Collection Time: 11/18/17  8:01 AM  Result Value Ref Range   WBC 6.1 4.0 - 10.5 K/uL   RBC 4.59 3.87 - 5.11 Mil/uL   Hemoglobin 14.0 12.0 - 15.0 g/dL   HCT 42.2 36.0 - 46.0 %   MCV 92.0 78.0 - 100.0 fl   MCHC 33.3 30.0 - 36.0 g/dL   RDW 13.0 11.5 - 15.5 %   Platelets 311.0 150.0 - 400.0 K/uL   Neutrophils Relative % 60.0 43.0 - 77.0 %   Lymphocytes Relative 31.4 12.0 - 46.0 %   Monocytes Relative 6.4 3.0 - 12.0 %   Eosinophils Relative 1.2 0.0 - 5.0 %   Basophils Relative 1.0 0.0 - 3.0 %   Neutro Abs 3.6 1.4 - 7.7 K/uL   Lymphs Abs 1.9 0.7 - 4.0 K/uL   Monocytes Absolute 0.4 0.1 - 1.0 K/uL   Eosinophils Absolute 0.1 0.0 - 0.7 K/uL   Basophils Absolute 0.1 0.0 - 0.1 K/uL  Vitamin D (25 hydroxy)     Status: Abnormal   Collection Time: 11/18/17  8:01 AM  Result Value Ref Range   VITD 25.48 (L) 30.00  - 100.00 ng/mL  B12 and Folate Panel     Status: None  Collection Time: 11/18/17  8:01 AM  Result Value Ref Range   Vitamin B-12 490 211 - 911 pg/mL   Folate 19.8 >5.9 ng/mL  Comp Met (CMET)     Status: Abnormal   Collection Time: 11/18/17  8:01 AM  Result Value Ref Range   Sodium 137 135 - 145 mEq/L   Potassium 4.1 3.5 - 5.1 mEq/L   Chloride 103 96 - 112 mEq/L   CO2 25 19 - 32 mEq/L   Glucose, Bld 96 70 - 99 mg/dL   BUN 9 6 - 23 mg/dL   Creatinine, Ser 0.84 0.40 - 1.20 mg/dL   Total Bilirubin 0.5 0.2 - 1.2 mg/dL   Alkaline Phosphatase 31 (L) 39 - 117 U/L   AST 14 0 - 37 U/L   ALT 9 0 - 35 U/L   Total Protein 7.0 6.0 - 8.3 g/dL   Albumin 4.3 3.5 - 5.2 g/dL   Calcium 9.1 8.4 - 10.5 mg/dL   GFR 76.68 >60.00 mL/min  Lipid panel     Status: Abnormal   Collection Time: 11/18/17  8:01 AM  Result Value Ref Range   Cholesterol 181 0 - 200 mg/dL    Comment: ATP III Classification       Desirable:  < 200 mg/dL               Borderline High:  200 - 239 mg/dL          High:  > = 240 mg/dL   Triglycerides 107.0 0.0 - 149.0 mg/dL    Comment: Normal:  <150 mg/dLBorderline High:  150 - 199 mg/dL   HDL 38.70 (L) >39.00 mg/dL   VLDL 21.4 0.0 - 40.0 mg/dL   LDL Cholesterol 121 (H) 0 - 99 mg/dL   Total CHOL/HDL Ratio 5     Comment:                Men          Women1/2 Average Risk     3.4          3.3Average Risk          5.0          4.42X Average Risk          9.6          7.13X Average Risk          15.0          11.0                       NonHDL 142.68     Comment: NOTE:  Non-HDL goal should be 30 mg/dL higher than patient's LDL goal (i.e. LDL goal of < 70 mg/dL, would have non-HDL goal of < 100 mg/dL)    Assessment/Plan: 1. Tension headache IM Toradol given. Start Tizanidine as directed. Start Mobic tomorrow. Supportive measures and OTC medications reviewed. She is to call her headache specialist for follow-up.   - ketorolac (TORADOL) injection 60 mg   Leeanne Rio, [place]

## 2018-01-03 NOTE — Patient Instructions (Signed)
Please stay well-hydrated and get plenty of rest. Start Mobic tomorrow.  Take the Tizanidine as directed for muscle tension. Apply topical Icy Hot to the area.  Call your specialist to schedule a follow-up appointment.

## 2018-01-16 ENCOUNTER — Ambulatory Visit: Payer: Self-pay | Admitting: Obstetrics & Gynecology

## 2018-02-01 ENCOUNTER — Encounter: Admitting: Physical Medicine & Rehabilitation

## 2018-02-16 ENCOUNTER — Ambulatory Visit: Payer: Self-pay | Admitting: Obstetrics & Gynecology

## 2018-03-06 ENCOUNTER — Other Ambulatory Visit: Payer: Self-pay

## 2018-03-06 ENCOUNTER — Encounter: Payer: Self-pay | Admitting: Physician Assistant

## 2018-03-06 ENCOUNTER — Ambulatory Visit (INDEPENDENT_AMBULATORY_CARE_PROVIDER_SITE_OTHER): Admitting: Physician Assistant

## 2018-03-06 VITALS — BP 102/60 | HR 70 | Temp 98.6°F | Resp 14 | Ht 65.0 in | Wt 195.0 lb

## 2018-03-06 DIAGNOSIS — R21 Rash and other nonspecific skin eruption: Secondary | ICD-10-CM | POA: Diagnosis not present

## 2018-03-06 NOTE — Progress Notes (Signed)
Patient presents to clinic today c/o red blotchy areas of skin noted over the past week. Denies itch or pain. Denies change in soaps, lotions, detergents etc. [place] had no issue there. States these areas are noticeable after she takes a shower. Has noted a little area of what seems like razor burn on her lower extremities. Denies fever, chills, malaise or fatigue. Denies contact with similar symptoms.   Past Medical History:  Diagnosis Date  . Anxiety   . Asthma   . Depression   . History of migraine    No issue in 2 years  . SVT (supraventricular tachycardia) ([facility])     Current Outpatient Medications on File Prior to Visit  Medication Sig Dispense Refill  . Aripiprazole 1 MG/ML mL Take 0.5 mLs (0.5 mg total) by mouth daily for 14 days, THEN 0.25 mLs (0.25 mg total) daily for 14 days. 15 mL 0  . Cholecalciferol (VITAMIN D3) 5000 units CAPS Take 1 capsule by mouth daily.    . meloxicam (MOBIC) 7.5 MG tablet Take 1 tablet (7.5 mg total) by mouth daily. 15 tablet 0  . nebivolol (BYSTOLIC) 5 MG tablet Take 1 tablet (5 mg total) by mouth daily. 90 tablet 2  . temazepam (RESTORIL) 15 MG capsule TK 1 C PO QHS MAY REPEAT PRN  2  . Testosterone 12.5 MG PLLT by Implant route as directed.    Marland Kitchen. tiZANidine (ZANAFLEX) 2 MG tablet Take 1 tablet (2 mg total) by mouth every 8 (eight) hours as needed for muscle spasms. 30 tablet 0  . tobramycin-dexamethasone (TOBRADEX) ophthalmic solution INT 1 DROP INTO LEFT EYE QID AS DIRECTED  0  . VIIBRYD 20 MG TABS Take 3 tablets by mouth daily 270 tablet 1   No current facility-administered medications on file prior to visit.     Allergies  Allergen Reactions  . Penicillins Nausea And Vomiting  . Sulfa Antibiotics Nausea And Vomiting    Family History  Problem Relation Age of Onset  . Hypertension Mother   . Hyperlipidemia Mother   . Suicidality Brother   . Angina Paternal Grandmother     Social History   Socioeconomic History   . Marital status: Married    Spouse name: Not on file  . Number of children: Not on file  . Years of education: Not on file  . Highest education level: Not on file  Occupational History  . Not on file  Social Needs  . Financial resource strain: Not on file  . Food insecurity:    Worry: Not on file    Inability: Not on file  . Transportation needs:    Medical: Not on file    Non-medical: Not on file  Tobacco Use  . Smoking status: Never Smoker  . Smokeless tobacco: Never Used  Substance and Sexual Activity  . Alcohol use: No  . Drug use: No  . Sexual activity: Yes    Birth control/protection: Pill  Lifestyle  . Physical activity:    Days per week: Not on file    Minutes per session: Not on file  . Stress: Not on file  Relationships  . Social connections:    Talks on phone: Not on file    Gets together: Not on file    Attends religious service: Not on file    Active member of club or organization: Not on file    Attends meetings of clubs or organizations: Not on file    Relationship  status: Not on file  Other Topics Concern  . Not on file  Social History Narrative  . Not on file   Review of Systems - See HPI.  All other ROS are negative.  BP 102/60   Pulse 70   Temp 98.6 F (37 C) (Oral)   Resp 14   Ht 5\' 5"  (1.651 m)   Wt 195 lb (88.5 kg)   SpO2 99%   BMI 32.45 kg/m   Physical Exam  Constitutional: She is oriented to person, place, and time. She appears well-developed and well-nourished.  HENT:  Head: Normocephalic and atraumatic.  Cardiovascular: Normal rate, regular rhythm and normal heart sounds.  Pulmonary/Chest: Effort normal.  Neurological: She is alert and oriented to person, place, and time.  Skin: Skin is warm.  Lower extremities assessed without definitive rash. There are areas where the pores are larger. No sign of folliculitis, cellulitis or maculopapular rash. Some cerotic skin of arms and forehead noted.    Assessment/Plan: 1. Rash Heat  related. Suspect she is getting areas of capillary dilation as she notes the redness with showers/heat only. Some xerotic skin noted without true rash. Supportive measures and OTC creams discussed. Follow-up if not improving.    Piedad Climes, PA-C

## 2018-03-06 NOTE — Telephone Encounter (Signed)
Please contact the patient. See what all current symptoms are. Have her check for fever. Also when was her last testosterone pellet implantation. Hormones can cause these types of symptoms as well.

## 2018-03-06 NOTE — Patient Instructions (Signed)
Please stay well-hydrated and get plenty of rest.  The rash looks like a heat rash -- make sure when you shower that you are keeping the water warm but not hot.  Limit time outside.  Put a humidifier in the bedroom to help moisturize the skin. Get a good gentle moisturizing cream like Cetaphil (hypoallergenic) to try. Let me know if things are not improving.   Get some Opcon A allergy drops over the counter to try for your left eye. If not improving, I would recommend a second opinion with a different eye specialist before proceeding with surgery.

## 2018-03-10 ENCOUNTER — Encounter (HOSPITAL_COMMUNITY): Payer: Self-pay

## 2018-03-10 ENCOUNTER — Emergency Department (HOSPITAL_COMMUNITY)
Admission: EM | Admit: 2018-03-10 | Discharge: 2018-03-10 | Disposition: A | Discharge status: Home / Self Care | Attending: Emergency Medicine | Admitting: Emergency Medicine

## 2018-03-10 ENCOUNTER — Other Ambulatory Visit: Payer: Self-pay

## 2018-03-10 DIAGNOSIS — Z5321 Procedure and treatment not carried out due to patient leaving prior to being seen by health care provider: Secondary | ICD-10-CM | POA: Insufficient documentation

## 2018-03-10 DIAGNOSIS — H5712 Ocular pain, left eye: Secondary | ICD-10-CM | POA: Diagnosis not present

## 2018-03-10 MED ORDER — TETRACAINE HCL 0.5 % OP SOLN: 2.0000 [drp] | Freq: Once | OPHTHALMIC | Status: DC

## 2018-03-10 MED ORDER — FLUORESCEIN SODIUM 1 MG OP STRP: 1.0000 | ORAL_STRIP | Freq: Once | OPHTHALMIC | Status: DC

## 2018-03-10 NOTE — ED Triage Notes (Signed)
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 She has an appointment scheduled to have a stent placed in it on May 6. However, yesterday it started to hurt, which is new. She reports that it has been watering and is slightly blurry as well. NAD.

## 2018-03-22 ENCOUNTER — Encounter: Payer: Self-pay | Admitting: Obstetrics & Gynecology

## 2018-03-27 ENCOUNTER — Ambulatory Visit: Payer: Self-pay | Admitting: Obstetrics & Gynecology

## 2018-06-27 ENCOUNTER — Other Ambulatory Visit: Payer: Self-pay | Admitting: Physician Assistant

## 2018-06-27 DIAGNOSIS — F320 Major depressive disorder, single episode, mild: Secondary | ICD-10-CM

## 2018-08-09 MED ORDER — NEBIVOLOL HCL 5 MG PO TABS: 5.0000 mg | ORAL_TABLET | Freq: Every day | ORAL | 0 refills | Status: DC

## 2018-08-09 NOTE — Telephone Encounter (Signed)
Pt's medication was sent to pt's pharmacy as requested. Confirmation received.  °

## 2018-08-10 ENCOUNTER — Ambulatory Visit: Payer: Self-pay | Admitting: Cardiology

## 2018-10-02 ENCOUNTER — Other Ambulatory Visit: Payer: Self-pay

## 2018-10-02 ENCOUNTER — Encounter: Payer: Self-pay | Admitting: Physician Assistant

## 2018-10-02 ENCOUNTER — Ambulatory Visit (INDEPENDENT_AMBULATORY_CARE_PROVIDER_SITE_OTHER): Admitting: Physician Assistant

## 2018-10-02 VITALS — BP 102/74 | HR 70 | Temp 98.9°F | Resp 14 | Ht 65.0 in | Wt 205.0 lb

## 2018-10-02 DIAGNOSIS — F320 Major depressive disorder, single episode, mild: Secondary | ICD-10-CM | POA: Diagnosis not present

## 2018-10-02 DIAGNOSIS — M542 Cervicalgia: Secondary | ICD-10-CM

## 2018-10-02 MED ORDER — MELOXICAM 7.5 MG PO TABS: 7.5000 mg | ORAL_TABLET | Freq: Every day | ORAL | 0 refills | Status: DC

## 2018-10-02 MED ORDER — KETOROLAC TROMETHAMINE 60 MG/2ML IM SOLN
60.0000 mg | Freq: Once | INTRAMUSCULAR | Status: AC
  Administered 2018-10-02: 60 mg via INTRAMUSCULAR

## 2018-10-02 NOTE — Progress Notes (Signed)
Acute Office Visit  Subjective:    Patient ID: Cassandra Leach, female    DOB: 08-Jun-1971, 47 y.o.   MRN: [id number]  Chief Complaint  Patient presents with  . Neck Pain  . Depression   HPI Patient is in today c/o flare up of her cervicalgia over the weekend. Denies any known truama or injury. Notes waking up with pain in R cervical region with radiation up neck. Denies decreased ROM, or radiation into extremities. Tried to see her chiropractor who has been doing adjustments 1-2 x month for maintenance but has not been able to get in.   Past Medical History:  Diagnosis Date  . Anxiety   . Asthma   . Depression   . History of migraine    No issue in 2 years  . SVT (supraventricular tachycardia) (HCC)     Past Surgical History:  Procedure Laterality Date  . RADIOFREQUENCY ABLATION NERVES      Family History  Problem Relation Age of Onset  . Hypertension Mother   . Hyperlipidemia Mother   . Suicidality Brother   . Angina Paternal Grandmother     Social History   Socioeconomic History  . Marital status: Married    Spouse name: Not on file  . Number of children: Not on file  . Years of education: Not on file  . Highest education level: Not on file  Occupational History  . Not on file  Social Needs  . Financial resource strain: Not on file  . Food insecurity:    Worry: Not on file    Inability: Not on file  . Transportation needs:    Medical: Not on file    Non-medical: Not on file  Tobacco Use  . Smoking status: Never Smoker  . Smokeless tobacco: Never Used  Substance and Sexual Activity  . Alcohol use: No  . Drug use: No  . Sexual activity: Yes    Birth control/protection: Pill  Lifestyle  . Physical activity:    Days per week: Not on file    Minutes per session: Not on file  . Stress: Not on file  Relationships  . Social connections:    Talks on phone: Not on file    Gets together: Not on file    Attends religious service: Not on file    Active  member of club or organization: Not on file    Attends meetings of clubs or organizations: Not on file    Relationship status: Not on file  . Intimate partner violence:    Fear of current or ex partner: Not on file    Emotionally abused: Not on file    Physically abused: Not on file    Forced sexual activity: Not on file  Other Topics Concern  . Not on file  Social History Narrative  . Not on file    Outpatient Medications Prior to Visit  Medication Sig Dispense Refill  . ARIPiprazole (ABILIFY) 2 MG tablet Take 2 mg by mouth 2 (two) times daily.     Marland Kitchen buPROPion (WELLBUTRIN SR) 150 MG 12 hr tablet Take 150 mg by mouth daily.    . Cholecalciferol (VITAMIN D3) 5000 units CAPS Take 1 capsule by mouth daily.    . Melatonin ER 5 MG TBCR Take 1 tablet by mouth at bedtime.    . nebivolol (BYSTOLIC) 5 MG tablet Take 1 tablet (5 mg total) by mouth daily. Please keep upcoming appt in January for future refills. Thank you 90  tablet 0  . VIIBRYD 20 MG TABS TAKE 3 TABLETS DAILY (Patient taking differently: Take 40 mg by mouth daily. TAKE 3 TABLETS DAILY) 270 tablet 1  . Aripiprazole 1 MG/ML mL Take 0.5 mLs (0.5 mg total) by mouth daily for 14 days, THEN 0.25 mLs (0.25 mg total) daily for 14 days. 15 mL 0  . meloxicam (MOBIC) 7.5 MG tablet Take 1 tablet (7.5 mg total) by mouth daily. 15 tablet 0  . temazepam (RESTORIL) 15 MG capsule TK 1 C PO QHS MAY REPEAT PRN  2  . Testosterone 12.5 MG PLLT by Implant route as directed.    Marland Kitchen tiZANidine (ZANAFLEX) 2 MG tablet Take 1 tablet (2 mg total) by mouth every 8 (eight) hours as needed for muscle spasms. 30 tablet 0  . tobramycin-dexamethasone (TOBRADEX) ophthalmic solution INT 1 DROP INTO LEFT EYE QID AS DIRECTED  0   No facility-administered medications prior to visit.     Allergies  Allergen Reactions  . Penicillins Nausea And Vomiting  . Sulfa Antibiotics Nausea And Vomiting   ROS Pertinent ROS are listed in the HPI.    Objective:    Physical  Exam  Constitutional: She is oriented to person, place, and time. She appears well-developed and well-nourished.  HENT:  Head: Normocephalic and atraumatic.  Eyes: Conjunctivae are normal.  Neck: Neck supple. No spinous process tenderness and no muscular tenderness present. Normal range of motion present. No thyromegaly present.  Cardiovascular: Normal rate, regular rhythm, normal heart sounds and intact distal pulses.  Pulmonary/Chest: Effort normal and breath sounds normal. No respiratory distress. She has no wheezes. She has no rales. She exhibits no tenderness.  Lymphadenopathy:    She has no cervical adenopathy.  Neurological: She is alert and oriented to person, place, and time.  Vitals reviewed.   BP 102/74   Pulse 70   Temp 98.9 F (37.2 C) (Oral)   Resp 14   Ht 5\' 5"  (1.651 m)   Wt 205 lb (93 kg)   SpO2 98%   BMI 34.11 kg/m  Wt Readings from Last 3 Encounters:  10/02/18 205 lb (93 kg)  03/06/18 195 lb (88.5 kg)  01/03/18 189 lb (85.7 kg)    Health Maintenance Due  Topic Date Due  . DTaP/Tdap/Td (1 - Tdap) 02/25/1988  . TETANUS/TDAP  02/25/1988  . PAP SMEAR  02/24/1990    There are no preventive care reminders to display for this patient.   Lab Results  Component Value Date   TSH 2.52 11/18/2017   Lab Results  Component Value Date   WBC 6.1 11/18/2017   HGB 14.0 11/18/2017   HCT 42.2 11/18/2017   MCV 92.0 11/18/2017   PLT 311.0 11/18/2017   Lab Results  Component Value Date   NA 137 11/18/2017   K 4.1 11/18/2017   CO2 25 11/18/2017   GLUCOSE 96 11/18/2017   BUN 9 11/18/2017   CREATININE 0.84 11/18/2017   BILITOT 0.5 11/18/2017   ALKPHOS 31 (L) 11/18/2017   AST 14 11/18/2017   ALT 9 11/18/2017   PROT 7.0 11/18/2017   ALBUMIN 4.3 11/18/2017   CALCIUM 9.1 11/18/2017   GFR 76.68 11/18/2017   Lab Results  Component Value Date   CHOL 181 11/18/2017   Lab Results  Component Value Date   HDL 38.70 (L) 11/18/2017   Lab Results  Component  Value Date   LDLCALC 121 (H) 11/18/2017   Lab Results  Component Value Date   TRIG 107.0 11/18/2017  Lab Results  Component Value Date   CHOLHDL 5 11/18/2017   No results found for: HGBA1C     Assessment & Plan:   Problem List Items Addressed This Visit      Other   Depression, major, single episode, mild (HCC)    Currently on viibryd 40 mg daily, Abilify 2 mg daily and has restarted Wellbutrin at 150 mg QD. Tolerating well thus far. No anxiety. Wellbutrin is helping with appetite. Will monitor. Follow-up via MyChart 2 weeks.       Relevant Medications   buPROPion (WELLBUTRIN SR) 150 MG 12 hr tablet   Cervicalgia - Primary    Acute flare. IM Toradol given. Rx Meloxicam 7.5 mg to start tomorrow. Supportive measures and OTC medications reviewed. She is to follow-up with her specialist.       Relevant Medications   ketorolac (TORADOL) injection 60 mg (Completed) (Start on 10/02/2018  3:30 PM)       Meds ordered this encounter  Medications  . meloxicam (MOBIC) 7.5 MG tablet    Sig: Take 1 tablet (7.5 mg total) by mouth daily.    Dispense:  15 tablet    Refill:  0    Order Specific Question:   Supervising Provider    Answer:   Neena RhymesABORI, KATHERINE E [[id number]]  . ketorolac (TORADOL) injection 60 mg     Piedad ClimesWilliam Cody Rylea Selway, [place] JerseyPA-C

## 2018-10-02 NOTE — Assessment & Plan Note (Signed)
Currently on viibryd 40 mg daily, Abilify 2 mg daily and has restarted Wellbutrin at 150 mg QD. Tolerating well thus far. No anxiety. Wellbutrin is helping with appetite. Will monitor. Follow-up via MyChart 2 weeks.

## 2018-10-02 NOTE — Assessment & Plan Note (Signed)
Acute flare. IM Toradol given. Rx Meloxicam 7.5 mg to start tomorrow. Supportive measures and OTC medications reviewed. She is to follow-up with her specialist.

## 2018-10-02 NOTE — Patient Instructions (Signed)
Please start the Meloxicam tomorrow taking with food. Tylenol for breakthrough pain. Continue care with Chiropractor. The shot given today should calm things down.  Keep up with current dose of Viibryd and Wellbutrin.  Message me in 2 weeks to let me know how you are doing so we can make further adjustments.

## 2018-10-26 ENCOUNTER — Encounter: Payer: Self-pay | Admitting: Physician Assistant

## 2018-10-27 MED ORDER — BUPROPION HCL ER (XL) 300 MG PO TB24
300.0000 mg | ORAL_TABLET | Freq: Every day | ORAL | 1 refills | Status: DC
Start: 2018-10-27 — End: 2019-04-05

## 2018-11-22 ENCOUNTER — Telehealth: Payer: Self-pay | Admitting: Cardiology

## 2018-11-22 ENCOUNTER — Ambulatory Visit: Payer: Self-pay | Admitting: Cardiology

## 2018-11-22 MED ORDER — NEBIVOLOL HCL 5 MG PO TABS: 5.0000 mg | ORAL_TABLET | Freq: Every day | ORAL | 0 refills | Status: DC

## 2018-11-22 NOTE — Telephone Encounter (Signed)
New Message    *STAT* If patient is at the pharmacy, call can be transferred to refill team.   1. Which medications need to be refilled? (please list name of each medication and dose if known) Bystolic 5mg    2. Which pharmacy/location (including street and city if local pharmacy) is medication to be sent to? [facility]  3. Do they need a 30 day or 90 day supply? 90 day supply

## 2018-11-22 NOTE — Telephone Encounter (Signed)
Pt's medication was sent to pt's pharmacy as requested, confirmation received.  

## 2018-11-24 ENCOUNTER — Ambulatory Visit: Payer: Self-pay | Admitting: Physician Assistant

## 2018-11-24 ENCOUNTER — Encounter: Payer: Self-pay | Admitting: Physician Assistant

## 2018-11-24 ENCOUNTER — Ambulatory Visit (INDEPENDENT_AMBULATORY_CARE_PROVIDER_SITE_OTHER): Admitting: Physician Assistant

## 2018-11-24 ENCOUNTER — Other Ambulatory Visit: Payer: Self-pay

## 2018-11-24 VITALS — BP 102/70 | HR 76 | Temp 98.2°F | Resp 16 | Ht 65.0 in | Wt 199.0 lb

## 2018-11-24 DIAGNOSIS — J069 Acute upper respiratory infection, unspecified: Secondary | ICD-10-CM

## 2018-11-24 DIAGNOSIS — B9789 Other viral agents as the cause of diseases classified elsewhere: Secondary | ICD-10-CM

## 2018-11-24 MED ORDER — BENZONATATE 100 MG PO CAPS: 100.0000 mg | ORAL_CAPSULE | Freq: Three times a day (TID) | ORAL | 0 refills | Status: DC | PRN

## 2018-11-24 MED ORDER — BECLOMETHASONE DIPROP HFA 40 MCG/ACT IN AERB: 2.0000 | INHALATION_SPRAY | Freq: Two times a day (BID) | RESPIRATORY_TRACT | 0 refills | Status: DC

## 2018-11-24 NOTE — Patient Instructions (Addendum)
Please keep well-hydrated and get plenty of rest. Keep the humidifier in the bedroom.  Start the cough medication as directed along with over the counter plain Mucinex. Start saline nasal rinses.  The Qvar is at the pharmacy to use as directed if you note wheeze or worsening chest tightness.

## 2018-11-24 NOTE — Progress Notes (Signed)
Acute Office Visit  Subjective:    Patient ID: Cassandra Leach, female    DOB: Apr 13, 1971, 48 y.o.   MRN: [id number]  Chief Complaint  Patient presents with  . Cough    Patient is in today for 1 day of nasal congestion, dry cough and ear pressure. Notes mild scratchy throat. Also noting chest tightness without chest pain or wheezing. Denies recent travel or sick contact. Denies fever, aches, SOB. Has not taken anything   Past Medical History:  Diagnosis Date  . Anxiety   . Asthma   . Depression   . History of migraine    No issue in 2 years  . SVT (supraventricular tachycardia) (HCC)     Past Surgical History:  Procedure Laterality Date  . RADIOFREQUENCY ABLATION NERVES      Family History  Problem Relation Age of Onset  . Hypertension Mother   . Hyperlipidemia Mother   . Suicidality Brother   . Angina Paternal Grandmother     Social History   Socioeconomic History  . Marital status: Married    Spouse name: Not on file  . Number of children: Not on file  . Years of education: Not on file  . Highest education level: Not on file  Occupational History  . Not on file  Social Needs  . Financial resource strain: Not on file  . Food insecurity:    Worry: Not on file    Inability: Not on file  . Transportation needs:    Medical: Not on file    Non-medical: Not on file  Tobacco Use  . Smoking status: Never Smoker  . Smokeless tobacco: Never Used  Substance and Sexual Activity  . Alcohol use: No  . Drug use: No  . Sexual activity: Yes    Birth control/protection: Pill  Lifestyle  . Physical activity:    Days per week: Not on file    Minutes per session: Not on file  . Stress: Not on file  Relationships  . Social connections:    Talks on phone: Not on file    Gets together: Not on file    Attends religious service: Not on file    Active member of club or organization: Not on file    Attends meetings of clubs or organizations: Not on file    Relationship  status: Not on file  . Intimate partner violence:    Fear of current or ex partner: Not on file    Emotionally abused: Not on file    Physically abused: Not on file    Forced sexual activity: Not on file  Other Topics Concern  . Not on file  Social History Narrative  . Not on file    Outpatient Medications Prior to Visit  Medication Sig Dispense Refill  . ARIPiprazole (ABILIFY) 2 MG tablet Take 2 mg by mouth 2 (two) times daily.     Marland Kitchen buPROPion (WELLBUTRIN XL) 300 MG 24 hr tablet Take 1 tablet (300 mg total) by mouth daily. 90 tablet 1  . Melatonin ER 5 MG TBCR Take 1 tablet by mouth at bedtime.    . meloxicam (MOBIC) 7.5 MG tablet Take 1 tablet (7.5 mg total) by mouth daily. 15 tablet 0  . nebivolol (BYSTOLIC) 5 MG tablet Take 1 tablet (5 mg total) by mouth daily. Please keep upcoming appt in March before anymore refills. Final Attempt 90 tablet 0  . VIIBRYD 20 MG TABS TAKE 3 TABLETS DAILY (Patient taking differently: Take 40  mg by mouth daily. TAKE 3 TABLETS DAILY) 270 tablet 1  . Cholecalciferol (VITAMIN D3) 5000 units CAPS Take 1 capsule by mouth daily.     No facility-administered medications prior to visit.     Allergies  Allergen Reactions  . Penicillins Nausea And Vomiting  . Sulfa Antibiotics Nausea And Vomiting    Review of Systems  Constitutional: Positive for malaise/fatigue. Negative for chills and fever.  HENT: Positive for sore throat. Negative for ear pain.   Respiratory: Positive for cough and shortness of breath.   Cardiovascular: Positive for chest pain.  Gastrointestinal: Negative for blood in stool, constipation, diarrhea, nausea and vomiting.  Musculoskeletal: Negative for falls, joint pain, myalgias and neck pain.  Neurological: Negative for dizziness, weakness and headaches.   Patient endorses history of exercise induced asthma but is no longer taking medications due to SVT exacerbations.     Objective:    Physical Exam  Constitutional: She  appears well-developed and well-nourished.  HENT:  Head: Normocephalic.  Right Ear: Tympanic membrane normal.  Left Ear: Tympanic membrane normal.  Nose: Nose normal.  Mouth/Throat: Uvula is midline and mucous membranes are normal.  Eyes: Pupils are equal, round, and reactive to light. Conjunctivae are normal.  Neck: Neck supple.  Cardiovascular: Normal rate, regular rhythm and normal heart sounds.  Pulmonary/Chest: Effort normal and breath sounds normal.   Sinuses non-tender to palpation,   BP 102/70   Pulse 76   Temp 98.2 F (36.8 C) (Oral)   Resp 16   Ht 5\' 5"  (1.651 m)   Wt 199 lb (90.3 kg)   SpO2 98%   BMI 33.12 kg/m  Wt Readings from Last 3 Encounters:  11/24/18 199 lb (90.3 kg)  10/02/18 205 lb (93 kg)  03/06/18 195 lb (88.5 kg)    Health Maintenance Due  Topic Date Due  . DTaP/Tdap/Td (1 - Tdap) 02/25/1980  . TETANUS/TDAP  02/25/1988  . PAP SMEAR-Modifier  02/24/1990    There are no preventive care reminders to display for this patient.   Lab Results  Component Value Date   TSH 2.52 11/18/2017   Lab Results  Component Value Date   WBC 6.1 11/18/2017   HGB 14.0 11/18/2017   HCT 42.2 11/18/2017   MCV 92.0 11/18/2017   PLT 311.0 11/18/2017   Lab Results  Component Value Date   NA 137 11/18/2017   K 4.1 11/18/2017   CO2 25 11/18/2017   GLUCOSE 96 11/18/2017   BUN 9 11/18/2017   CREATININE 0.84 11/18/2017   BILITOT 0.5 11/18/2017   ALKPHOS 31 (L) 11/18/2017   AST 14 11/18/2017   ALT 9 11/18/2017   PROT 7.0 11/18/2017   ALBUMIN 4.3 11/18/2017   CALCIUM 9.1 11/18/2017   GFR 76.68 11/18/2017   Lab Results  Component Value Date   CHOL 181 11/18/2017   Lab Results  Component Value Date   HDL 38.70 (L) 11/18/2017   Lab Results  Component Value Date   LDLCALC 121 (H) 11/18/2017   Lab Results  Component Value Date   TRIG 107.0 11/18/2017   Lab Results  Component Value Date   CHOLHDL 5 11/18/2017   No results found for: HGBA1C       Assessment & Plan:   1. Viral URI with cough With mild reactive airway. Supportive measures and OTC medications reviewed. Start Tessalon for cough. Rx Qvar to use for chest tightness as she cannot tolerate SABA/LABA. Strict return precautions reviewed with patient.   - beclomethasone (  QVAR) 40 MCG/ACT inhaler; Inhale 2 puffs into the lungs 2 (two) times daily.  Dispense: 10.6 g; Refill: 0 - benzonatate (TESSALON) 100 MG capsule; Take 1 capsule (100 mg total) by mouth 3 (three) times daily as needed for cough.  Dispense: 30 capsule; Refill: 0   Piedad ClimesWilliam Cody Jackelin Correia, PA-C

## 2018-11-24 NOTE — Progress Notes (Deleted)
Acute Office Visit  Subjective:    Patient ID: Cassandra Leach, female    DOB: [birth year], 48 y.o.   MRN: [id number]  Chief Complaint  Patient presents with  . Cough    Patient is in today for 1 day of nasal congestion, dry cough and ear pressure. Notes mild scratchy throat. Also noting chest tightness without chest pain or wheezing. Denies recent travel or sick contact. Denies fever, aches, SOB. Has not taken anything   Past Medical History:  Diagnosis Date  . Anxiety   . Asthma   . Depression   . History of migraine    No issue in 2 years  . SVT (supraventricular tachycardia) (HCC)     Past Surgical History:  Procedure Laterality Date  . RADIOFREQUENCY ABLATION NERVES      Family History  Problem Relation Age of Onset  . Hypertension Mother   . Hyperlipidemia Mother   . Suicidality Brother   . Angina Paternal Grandmother     Social History   Socioeconomic History  . Marital status: Married    Spouse name: Not on file  . Number of children: Not on file  . Years of education: Not on file  . Highest education level: Not on file  Occupational History  . Not on file  Social Needs  . Financial resource strain: Not on file  . Food insecurity:    Worry: Not on file    Inability: Not on file  . Transportation needs:    Medical: Not on file    Non-medical: Not on file  Tobacco Use  . Smoking status: Never Smoker  . Smokeless tobacco: Never Used  Substance and Sexual Activity  . Alcohol use: No  . Drug use: No  . Sexual activity: Yes    Birth control/protection: Pill  Lifestyle  . Physical activity:    Days per week: Not on file    Minutes per session: Not on file  . Stress: Not on file  Relationships  . Social connections:    Talks on phone: Not on file    Gets together: Not on file    Attends religious service: Not on file    Active member of club or organization: Not on file    Attends meetings of clubs or organizations: Not on file    Relationship  status: Not on file  . Intimate partner violence:    Fear of current or ex partner: Not on file    Emotionally abused: Not on file    Physically abused: Not on file    Forced sexual activity: Not on file  Other Topics Concern  . Not on file  Social History Narrative  . Not on file    Outpatient Medications Prior to Visit  Medication Sig Dispense Refill  . ARIPiprazole (ABILIFY) 2 MG tablet Take 2 mg by mouth 2 (two) times daily.     Marland Kitchen buPROPion (WELLBUTRIN XL) 300 MG 24 hr tablet Take 1 tablet (300 mg total) by mouth daily. 90 tablet 1  . Melatonin ER 5 MG TBCR Take 1 tablet by mouth at bedtime.    . meloxicam (MOBIC) 7.5 MG tablet Take 1 tablet (7.5 mg total) by mouth daily. 15 tablet 0  . nebivolol (BYSTOLIC) 5 MG tablet Take 1 tablet (5 mg total) by mouth daily. Please keep upcoming appt in March before anymore refills. Final Attempt 90 tablet 0  . VIIBRYD 20 MG TABS TAKE 3 TABLETS DAILY (Patient taking differently: Take 40  mg by mouth daily. TAKE 3 TABLETS DAILY) 270 tablet 1  . Cholecalciferol (VITAMIN D3) 5000 units CAPS Take 1 capsule by mouth daily.     No facility-administered medications prior to visit.     Allergies  Allergen Reactions  . Penicillins Nausea And Vomiting  . Sulfa Antibiotics Nausea And Vomiting    Review of Systems  Constitutional: Positive for malaise/fatigue. Negative for chills and fever.  HENT: Positive for postnasal drip, rhinorrhea (clear discharge from nose) and sore throat. Negative for ear pain.   Respiratory: Positive for cough and shortness of breath.   Cardiovascular: Positive for chest pain.  Gastrointestinal: Negative for blood in stool, constipation, diarrhea, nausea and vomiting.  Musculoskeletal: Negative for falls, joint pain, myalgias and neck pain.  Neurological: Negative for dizziness, weakness and headaches.   Patient endorses history of exercise induced asthma but is no longer taking medications due to SVT exacerbations.       Objective:    Physical Exam  Constitutional: She appears well-developed and well-nourished.  HENT:  Head: Normocephalic.  Right Ear: Tympanic membrane normal.  Left Ear: Tympanic membrane normal.  Nose: Nose normal.  Mouth/Throat: Uvula is midline and mucous membranes are normal.  Eyes: Pupils are equal, round, and reactive to light. Conjunctivae are normal.  Neck: Neck supple.  Cardiovascular: Normal rate, regular rhythm and normal heart sounds.  Pulmonary/Chest: Effort normal and breath sounds normal.   Sinuses non-tender to palpation,   BP 102/70   Pulse 76   Temp 98.2 F (36.8 C) (Oral)   Resp 16   Ht 5\' 5"  (1.651 m)   Wt 90.3 kg   SpO2 98%   BMI 33.12 kg/m  Wt Readings from Last 3 Encounters:  11/24/18 90.3 kg  10/02/18 93 kg  03/06/18 88.5 kg    Health Maintenance Due  Topic Date Due  . DTaP/Tdap/Td (1 - Tdap) 02/25/1980  . TETANUS/TDAP  02/25/1988  . PAP SMEAR-Modifier  02/24/1990    There are no preventive care reminders to display for this patient.   Lab Results  Component Value Date   TSH 2.52 11/18/2017   Lab Results  Component Value Date   WBC 6.1 11/18/2017   HGB 14.0 11/18/2017   HCT 42.2 11/18/2017   MCV 92.0 11/18/2017   PLT 311.0 11/18/2017   Lab Results  Component Value Date   NA 137 11/18/2017   K 4.1 11/18/2017   CO2 25 11/18/2017   GLUCOSE 96 11/18/2017   BUN 9 11/18/2017   CREATININE 0.84 11/18/2017   BILITOT 0.5 11/18/2017   ALKPHOS 31 (L) 11/18/2017   AST 14 11/18/2017   ALT 9 11/18/2017   PROT 7.0 11/18/2017   ALBUMIN 4.3 11/18/2017   CALCIUM 9.1 11/18/2017   GFR 76.68 11/18/2017   Lab Results  Component Value Date   CHOL 181 11/18/2017   Lab Results  Component Value Date   HDL 38.70 (L) 11/18/2017   Lab Results  Component Value Date   LDLCALC 121 (H) 11/18/2017   Lab Results  Component Value Date   TRIG 107.0 11/18/2017   Lab Results  Component Value Date   CHOLHDL 5 11/18/2017   No results found  for: HGBA1C     Assessment & Plan:   1. Viral URI with cough With mild reactive airway. Supportive measures and OTC medications reviewed. Start Tessalon for cough. Rx Qvar to use for chest tightness as she cannot tolerate SABA/LABA. Strict return precautions reviewed with patient.   - beclomethasone (  QVAR) 40 MCG/ACT inhaler; Inhale 2 puffs into the lungs 2 (two) times daily.  Dispense: 10.6 g; Refill: 0 - benzonatate (TESSALON) 100 MG capsule; Take 1 capsule (100 mg total) by mouth 3 (three) times daily as needed for cough.  Dispense: 30 capsule; Refill: 0   Piedad Climes, PA-C

## 2018-11-28 ENCOUNTER — Encounter: Payer: Self-pay | Admitting: Physician Assistant

## 2018-11-28 ENCOUNTER — Other Ambulatory Visit: Payer: Self-pay | Admitting: Physician Assistant

## 2018-11-28 ENCOUNTER — Other Ambulatory Visit: Payer: Self-pay | Admitting: Emergency Medicine

## 2018-11-28 DIAGNOSIS — J069 Acute upper respiratory infection, unspecified: Secondary | ICD-10-CM

## 2018-11-28 DIAGNOSIS — B9789 Other viral agents as the cause of diseases classified elsewhere: Principal | ICD-10-CM

## 2018-11-28 MED ORDER — HYDROCOD POLST-CPM POLST ER 10-8 MG/5ML PO SUER: 5.0000 mL | Freq: Two times a day (BID) | ORAL | 0 refills | Status: DC | PRN

## 2018-11-28 MED ORDER — FLUTICASONE PROPIONATE HFA 110 MCG/ACT IN AERO: 1.0000 | INHALATION_SPRAY | Freq: Two times a day (BID) | RESPIRATORY_TRACT | 3 refills | Status: DC

## 2018-11-29 ENCOUNTER — Encounter: Payer: Self-pay | Admitting: Physician Assistant

## 2018-11-29 ENCOUNTER — Other Ambulatory Visit: Payer: Self-pay

## 2018-11-29 ENCOUNTER — Ambulatory Visit (INDEPENDENT_AMBULATORY_CARE_PROVIDER_SITE_OTHER): Admitting: Physician Assistant

## 2018-11-29 VITALS — BP 110/70 | HR 76 | Temp 97.6°F | Resp 16 | Ht 65.0 in | Wt 199.0 lb

## 2018-11-29 DIAGNOSIS — R062 Wheezing: Secondary | ICD-10-CM

## 2018-11-29 DIAGNOSIS — J209 Acute bronchitis, unspecified: Secondary | ICD-10-CM | POA: Diagnosis not present

## 2018-11-29 MED ORDER — PREDNISONE 20 MG PO TABS: 40.0000 mg | ORAL_TABLET | Freq: Every day | ORAL | 0 refills | Status: DC

## 2018-11-29 NOTE — Patient Instructions (Signed)
Please continue the antibiotic and cough medication. Keep hydrated and get plenty of rest. Place a humidifier in the bedroom. Continue the inhaler as directed. Start the prednisone as directed.  Let me know if symptoms are not improving within 24-48 hours.

## 2018-11-29 NOTE — Progress Notes (Signed)
Patient presents to clinic today c/o continued cough and congestion, now with wheezing. Is on day 5 of symptoms. Was initially seen on 11/24/18 for < 1 day of symptoms and diagnosed with viral URI. Notes symptoms worsened over the next 2 days so she went to an Urgent Care this weekend and was given Doxycycline. Is taking as directed. Denies any worsening chest congestion or cough but now noting chest tightness and wheeze, not alleviated with inhaled steroid. Denies fever or chills. Notes some fatigue.  Cough is keeping her awake at night.  Past Medical History:  Diagnosis Date  . Anxiety   . Asthma   . Depression   . History of migraine    No issue in 2 years  . SVT (supraventricular tachycardia) (HCC)     Current Outpatient Medications on File Prior to Visit  Medication Sig Dispense Refill  . ARIPiprazole (ABILIFY) 2 MG tablet Take 2 mg by mouth 2 (two) times daily.     . beclomethasone (QVAR) 40 MCG/ACT inhaler Inhale into the lungs.    Marland Kitchen buPROPion (WELLBUTRIN XL) 300 MG 24 hr tablet Take 1 tablet (300 mg total) by mouth daily. 90 tablet 1  . chlorpheniramine-HYDROcodone (TUSSIONEX PENNKINETIC ER) 10-8 MG/5ML SUER Take 5 mLs by mouth every 12 (twelve) hours as needed. 140 mL 0  . Cholecalciferol (VITAMIN D3) 5000 units CAPS Take 1 capsule by mouth daily.    Marland Kitchen doxycycline (VIBRAMYCIN) 100 MG capsule Take by mouth.    . Melatonin ER 5 MG TBCR Take 1 tablet by mouth at bedtime.    . meloxicam (MOBIC) 7.5 MG tablet Take 1 tablet (7.5 mg total) by mouth daily. 15 tablet 0  . nebivolol (BYSTOLIC) 5 MG tablet Take 1 tablet (5 mg total) by mouth daily. Please keep upcoming appt in March before anymore refills. Final Attempt 90 tablet 0  . VIIBRYD 20 MG TABS TAKE 3 TABLETS DAILY (Patient taking differently: Take 40 mg by mouth daily. TAKE 3 TABLETS DAILY) 270 tablet 1   No current facility-administered medications on file prior to visit.     Allergies  Allergen Reactions  . Penicillins  Nausea And Vomiting  . Sulfa Antibiotics Nausea And Vomiting    Family History  Problem Relation Age of Onset  . Hypertension Mother   . Hyperlipidemia Mother   . Suicidality Brother   . Angina Paternal Grandmother     Social History   Socioeconomic History  . Marital status: Married    Spouse name: Not on file  . Number of children: Not on file  . Years of education: Not on file  . Highest education level: Not on file  Occupational History  . Not on file  Social Needs  . Financial resource strain: Not on file  . Food insecurity:    Worry: Not on file    Inability: Not on file  . Transportation needs:    Medical: Not on file    Non-medical: Not on file  Tobacco Use  . Smoking status: Never Smoker  . Smokeless tobacco: Never Used  Substance and Sexual Activity  . Alcohol use: No  . Drug use: No  . Sexual activity: Yes    Birth control/protection: Pill  Lifestyle  . Physical activity:    Days per week: Not on file    Minutes per session: Not on file  . Stress: Not on file  Relationships  . Social connections:    Talks on phone: Not on file  Gets together: Not on file    Attends religious service: Not on file    Active member of club or organization: Not on file    Attends meetings of clubs or organizations: Not on file    Relationship status: Not on file  Other Topics Concern  . Not on file  Social History Narrative  . Not on file   Review of Systems - See HPI.  All other ROS are negative.  BP 110/70   Pulse 76   Temp 97.6 F (36.4 C) (Oral)   Resp 16   Ht 5\' 5"  (1.651 m)   Wt 199 lb (90.3 kg)   SpO2 97%   BMI 33.12 kg/m   Physical Exam Vitals signs reviewed.  Constitutional:      Appearance: Normal appearance.  HENT:     Head: Normocephalic and atraumatic.     Right Ear: Tympanic membrane normal.     Left Ear: Tympanic membrane normal.     Nose: Nose normal.     Mouth/Throat:     Mouth: Mucous membranes are moist.  Eyes:      Conjunctiva/sclera: Conjunctivae normal.  Neck:     Musculoskeletal: Neck supple.  Cardiovascular:     Rate and Rhythm: Normal rate and regular rhythm.     Pulses: Normal pulses.     Heart sounds: Normal heart sounds.  Pulmonary:     Effort: Pulmonary effort is normal.     Breath sounds: Wheezing present.  Skin:    General: Skin is warm.  Neurological:     General: No focal deficit present.     Mental Status: She is alert and oriented to person, place, and time.  Psychiatric:        Mood and Affect: Mood normal.    Assessment/Plan: 1. Wheezing Starting in past 24 hours. Cannot tolerate SABA. Is using inhaled steroid. Start prednisone 40 mg x 5 days. Strict return precautions reviewed. - predniSONE (DELTASONE) 20 MG tablet; Take 2 tablets (40 mg total) by mouth daily with breakfast.  Dispense: 10 tablet; Refill: 0  2. Acute bronchitis, unspecified organism Take antibiotic, cough syrup and Mucinex as directed. Continue supportive measures. Discussed it will take more that 2 days of antibiotic to take care of infection. Strict return precautions reviewed.   Piedad Climes, PA-C

## 2018-11-30 ENCOUNTER — Ambulatory Visit (INDEPENDENT_AMBULATORY_CARE_PROVIDER_SITE_OTHER)

## 2018-11-30 ENCOUNTER — Encounter: Payer: Self-pay | Admitting: Physician Assistant

## 2018-11-30 DIAGNOSIS — R062 Wheezing: Secondary | ICD-10-CM | POA: Diagnosis not present

## 2018-12-04 ENCOUNTER — Ambulatory Visit (INDEPENDENT_AMBULATORY_CARE_PROVIDER_SITE_OTHER): Admitting: Physician Assistant

## 2018-12-04 ENCOUNTER — Encounter: Payer: Self-pay | Admitting: Physician Assistant

## 2018-12-04 ENCOUNTER — Other Ambulatory Visit: Payer: Self-pay

## 2018-12-04 VITALS — BP 104/70 | HR 92 | Temp 98.5°F | Resp 16 | Ht 65.0 in | Wt 205.0 lb

## 2018-12-04 DIAGNOSIS — R0982 Postnasal drip: Secondary | ICD-10-CM

## 2018-12-04 DIAGNOSIS — J40 Bronchitis, not specified as acute or chronic: Secondary | ICD-10-CM

## 2018-12-04 DIAGNOSIS — J9801 Acute bronchospasm: Secondary | ICD-10-CM | POA: Diagnosis not present

## 2018-12-04 MED ORDER — HYDROCODONE-HOMATROPINE 5-1.5 MG/5ML PO SYRP: 5.0000 mL | ORAL_SOLUTION | Freq: Three times a day (TID) | ORAL | 0 refills | Status: DC | PRN

## 2018-12-04 MED ORDER — AZITHROMYCIN 250 MG PO TABS: ORAL_TABLET | ORAL | 0 refills | Status: DC

## 2018-12-04 NOTE — Progress Notes (Signed)
Patient presents to clinic today c/o continued cough and now with significant post-nasal drip and rhinorrhea. Denies fever, chills, sinus pain, ear pain or tooth pain. Cough is dry with complete resolution of wheeze via prednisone given at last visit. Notes cough medication helped but has run out of medication. Would like to discuss other options. Had negative CXR at last visit on 11/30/18 that was unremarkable.  Past Medical History:  Diagnosis Date  . Anxiety   . Asthma   . Depression   . History of migraine    No issue in 2 years  . SVT (supraventricular tachycardia) (HCC)     Current Outpatient Medications on File Prior to Visit  Medication Sig Dispense Refill  . ARIPiprazole (ABILIFY) 2 MG tablet Take 2 mg by mouth 2 (two) times daily.     . beclomethasone (QVAR) 40 MCG/ACT inhaler Inhale into the lungs.    Marland Kitchen buPROPion (WELLBUTRIN XL) 300 MG 24 hr tablet Take 1 tablet (300 mg total) by mouth daily. 90 tablet 1  . Cholecalciferol (VITAMIN D3) 5000 units CAPS Take 1 capsule by mouth daily.    . Melatonin ER 5 MG TBCR Take 1 tablet by mouth at bedtime.    . meloxicam (MOBIC) 7.5 MG tablet Take 1 tablet (7.5 mg total) by mouth daily. 15 tablet 0  . nebivolol (BYSTOLIC) 5 MG tablet Take 1 tablet (5 mg total) by mouth daily. Please keep upcoming appt in March before anymore refills. Final Attempt 90 tablet 0  . VIIBRYD 20 MG TABS TAKE 3 TABLETS DAILY (Patient taking differently: Take 40 mg by mouth daily. TAKE 3 TABLETS DAILY) 270 tablet 1  . chlorpheniramine-HYDROcodone (TUSSIONEX PENNKINETIC ER) 10-8 MG/5ML SUER Take 5 mLs by mouth every 12 (twelve) hours as needed. (Patient not taking: Reported on 12/04/2018) 140 mL 0   No current facility-administered medications on file prior to visit.     Allergies  Allergen Reactions  . Penicillins Nausea And Vomiting  . Sulfa Antibiotics Nausea And Vomiting    Family History  Problem Relation Age of Onset  . Hypertension Mother   .  Hyperlipidemia Mother   . Suicidality Brother   . Angina Paternal Grandmother     Social History   Socioeconomic History  . Marital status: Married    Spouse name: Not on file  . Number of children: Not on file  . Years of education: Not on file  . Highest education level: Not on file  Occupational History  . Not on file  Social Needs  . Financial resource strain: Not on file  . Food insecurity:    Worry: Not on file    Inability: Not on file  . Transportation needs:    Medical: Not on file    Non-medical: Not on file  Tobacco Use  . Smoking status: Never Smoker  . Smokeless tobacco: Never Used  Substance and Sexual Activity  . Alcohol use: No  . Drug use: No  . Sexual activity: Yes    Birth control/protection: Pill  Lifestyle  . Physical activity:    Days per week: Not on file    Minutes per session: Not on file  . Stress: Not on file  Relationships  . Social connections:    Talks on phone: Not on file    Gets together: Not on file    Attends religious service: Not on file    Active member of club or organization: Not on file    Attends meetings of  clubs or organizations: Not on file    Relationship status: Not on file  Other Topics Concern  . Not on file  Social History Narrative  . Not on file   Review of Systems - See HPI.  All other ROS are negative.  BP 104/70   Pulse 92   Temp 98.5 F (36.9 C) (Oral)   Resp 16   Ht 5\' 5"  (1.651 m)   Wt 205 lb (93 kg)   LMP 11/23/2018   SpO2 98%   BMI 34.11 kg/m   Physical Exam Vitals signs reviewed.  Constitutional:      Appearance: Normal appearance.  HENT:     Head: Normocephalic and atraumatic.     Right Ear: Tympanic membrane and ear canal normal.     Left Ear: Tympanic membrane and ear canal normal.     Nose: Nose normal.     Mouth/Throat:     Mouth: Mucous membranes are moist.  Eyes:     Conjunctiva/sclera: Conjunctivae normal.     Pupils: Pupils are equal, round, and reactive to light.  Neck:       Musculoskeletal: Neck supple.  Cardiovascular:     Rate and Rhythm: Normal rate and regular rhythm.     Pulses: Normal pulses.     Heart sounds: Normal heart sounds.  Pulmonary:     Effort: Pulmonary effort is normal.     Breath sounds: Normal breath sounds.  Lymphadenopathy:     Cervical: No cervical adenopathy.  Neurological:     Mental Status: She is alert.  Psychiatric:        Mood and Affect: Mood normal.    Assessment/Plan: 1. PND (paroxysmal nocturnal dyspnea) - HYDROcodone-homatropine (HYCODAN) 5-1.5 MG/5ML syrup; Take 5 mLs by mouth every 8 (eight) hours as needed for cough.  Dispense: 120 mL; Refill: 0 2. Bronchospasm - azithromycin (ZITHROMAX) 250 MG tablet; Take 2 tablets on Day 1. Then take 1 tablet daily.  Dispense: 6 tablet; Refill: 0 - HYDROcodone-homatropine (HYCODAN) 5-1.5 MG/5ML syrup; Take 5 mLs by mouth every 8 (eight) hours as needed for cough.  Dispense: 120 mL; Refill:  3. Bronchitis - azithromycin (ZITHROMAX) 250 MG tablet; Take 2 tablets on Day 1. Then take 1 tablet daily.  Dispense: 6 tablet; Refill: 0   Wheezing has resolved. Issue remains this very severe cough that is dry but affecting sleep and daily life. Concern for residual bronchitis. Will giver her Azithromycin. Start Hycodan syrup. Start Flonase and antihistamine for PND. Continue Mucinex. Supportive measures and OTC medications reviewed.   Piedad Climes, PA-C

## 2018-12-04 NOTE — Patient Instructions (Signed)
Take antibiotic (Azithromycin) as directed.  Increase fluids.  Get plenty of rest. Use Mucinex for congestion. Continue the Qvar but may increase to 2 puffs twice daily for now. Start the new cough medication and take as directed. Take a daily probiotic (I recommend Align or Culturelle, but even Activia Yogurt may be beneficial).  A humidifier placed in the bedroom may offer some relief for a dry, scratchy throat of nasal irritation.  Read information below on acute bronchitis. Please call or return to clinic if symptoms are not improving.  Acute Bronchitis Bronchitis is when the airways that extend from the windpipe into the lungs get red, puffy, and painful (inflamed). Bronchitis often causes thick spit (mucus) to develop. This leads to a cough. A cough is the most common symptom of bronchitis. In acute bronchitis, the condition usually begins suddenly and goes away over time (usually in 2 weeks). Smoking, allergies, and asthma can make bronchitis worse. Repeated episodes of bronchitis may cause more lung problems.  HOME CARE  Rest.  Drink enough fluids to keep your pee (urine) clear or pale yellow (unless you need to limit fluids as told by your doctor).  Only take over-the-counter or prescription medicines as told by your doctor.  Avoid smoking and secondhand smoke. These can make bronchitis worse. If you are a smoker, think about using nicotine gum or skin patches. Quitting smoking will help your lungs heal faster.  Reduce the chance of getting bronchitis again by:  Washing your hands often.  Avoiding people with cold symptoms.  Trying not to touch your hands to your mouth, nose, or eyes.  Follow up with your doctor as told.  GET HELP IF: Your symptoms do not improve after 1 week of treatment. Symptoms include:  Cough.  Fever.  Coughing up thick spit.  Body aches.  Chest congestion.  Chills.  Shortness of breath.  Sore throat.  GET HELP RIGHT AWAY IF:   You have an  increased fever.  You have chills.  You have severe shortness of breath.  You have bloody thick spit (sputum).  You throw up (vomit) often.  You lose too much body fluid (dehydration).  You have a severe headache.  You faint.  MAKE SURE YOU:   Understand these instructions.  Will watch your condition.  Will get help right away if you are not doing well or get worse. Document Released: 04/19/2008 Document Revised: 07/04/2013 Document Reviewed: 04/24/2013 [facility] Patient Information 2015 [place], [place]. This information is not intended to replace advice given to you by your health care provider. Make sure you discuss any questions you have with your health care provider.

## 2018-12-07 ENCOUNTER — Encounter: Payer: Self-pay | Admitting: Physician Assistant

## 2018-12-07 DIAGNOSIS — R05 Cough: Secondary | ICD-10-CM

## 2018-12-07 DIAGNOSIS — R053 Chronic cough: Secondary | ICD-10-CM

## 2018-12-07 DIAGNOSIS — J45909 Unspecified asthma, uncomplicated: Secondary | ICD-10-CM

## 2018-12-08 MED ORDER — MONTELUKAST SODIUM 10 MG PO TABS: 10.0000 mg | ORAL_TABLET | Freq: Every day | ORAL | 3 refills | Status: DC

## 2018-12-08 NOTE — Telephone Encounter (Signed)
LM requesting call back to discuss cough syrup/medication request.

## 2018-12-08 NOTE — Telephone Encounter (Signed)
See MyChart message.  Please explain to patient why we cannot send in codeine-containing cough syrups early and that the other typical prescription option (promethazine-DM) is on back order. There are rules we need to follow. I have always been respectful of her and tried to take the best care of her so I am a bit taken aback by her comments. She is welcome to find a new provider.

## 2018-12-11 ENCOUNTER — Encounter: Payer: Self-pay | Admitting: Pulmonary Disease

## 2018-12-11 ENCOUNTER — Ambulatory Visit (INDEPENDENT_AMBULATORY_CARE_PROVIDER_SITE_OTHER): Admitting: Pulmonary Disease

## 2018-12-11 DIAGNOSIS — J208 Acute bronchitis due to other specified organisms: Secondary | ICD-10-CM | POA: Diagnosis not present

## 2018-12-11 MED ORDER — BENZONATATE 200 MG PO CAPS: 200.0000 mg | ORAL_CAPSULE | Freq: Two times a day (BID) | ORAL | 1 refills | Status: DC | PRN

## 2018-12-11 NOTE — Patient Instructions (Signed)
  You seem to be recovering well from a bad case of viral bronchitis. Refills on benzonatate 200 mg twice daily as needed x 60  Okay to stop Singulair in 2 weeks once wheezing resolves. Decrease Qvar to 2 puffs once daily in 1 to 2 weeks and taper to off. If longer duration required, then alternatives to Qvar would be Flovent/Pulmicort/Asmanex -check with pharmacy which one is covered.  STO P vaping

## 2018-12-11 NOTE — Assessment & Plan Note (Signed)
seem to be recovering well from a bad case of viral bronchitis.  Does not appear to be consistent with vaping associated lung injury (EVALI) Refills on benzonatate 200 mg twice daily as needed x 60  Does not need more prednisone at this time  Okay to stop Singulair in 2 weeks once wheezing resolves. Decrease Qvar to 2 puffs once daily in 1 to 2 weeks and taper to off. If longer duration required, then alternatives to Qvar would be Flovent/Pulmicort/Asmanex -check with pharmacy which one is covered.  STO P vaping

## 2018-12-11 NOTE — Progress Notes (Signed)
Subjective:    Patient ID: Cassandra Leach, female    DOB: 1971-09-04, 48 y.o.   MRN: [id number]  HPI  Chief Complaint  Patient presents with  . 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  She reports a dry cough at onset with significant wheezing.  She went to urgent care and was given doxycycline and then saw her PCP again and was given Z-Pak and prednisone 40 mg to take for 5 days.  She was also placed on Qvar and Singulair. Chest x-ray was obtained 11/30/2018 which I personally reviewed, does not show any infiltrates or effusions. She showed me a recording of her wheezing which seems to be significant in both inspiratory and expiratory.  She now states that she is 75% improved cough is almost gone, she also took a codeine cough syrup and benzonatate cough cold seemed to have helped. Some wheezing persists, dyspnea has resolved.  She reports no history of exercise-induced asthma in her 4220s for which she was given albuterol and had an episode of SVT which was attributed to excess albuterol about 3 years ago since then she has been on Bystolic.  She smoked less than 10 pack years and quit in her late 6820s.  She started vaping about a year ago and uses nicotine in her vape, she quit this at the onset of this particular illness  She lives with her husband who is retired Hotel managermilitary and her 50 year old mother is now coming down with a head cold   Past Medical History:  Diagnosis Date  . Anxiety   . Asthma   . Depression   . History of migraine    No issue in 2 years  . SVT (supraventricular tachycardia) (HCC)     Past Surgical History:  Procedure Laterality Date  . RADIOFREQUENCY ABLATION NERVES      Allergies  Allergen Reactions  . Penicillins Nausea And Vomiting  . Sulfa Antibiotics Nausea And Vomiting    Social History    Socioeconomic History  . Marital status: Married    Spouse name: Not on file  . Number of children: Not on file  . Years of education: Not on file  . Highest education level: Not on file  Occupational History  . Not on file  Social Needs  . Financial resource strain: Not on file  . Food insecurity:    Worry: Not on file    Inability: Not on file  . Transportation needs:    Medical: Not on file    Non-medical: Not on file  Tobacco Use  . Smoking status: Never Smoker  . Smokeless tobacco: Never Used  Substance and Sexual Activity  . Alcohol use: No  . Drug use: No  . Sexual activity: Yes    Birth control/protection: Pill  Lifestyle  . Physical activity:    Days per week: Not on file    Minutes per session: Not on file  . Stress: Not on file  Relationships  . Social connections:    Talks on phone: Not on file    Gets together: Not on file    Attends religious service: Not on file    Active member of club or organization: Not on file    Attends meetings of clubs or organizations: Not on file    Relationship status: Not on file  . Intimate partner  violence:    Fear of current or ex partner: Not on file    Emotionally abused: Not on file    Physically abused: Not on file    Forced sexual activity: Not on file  Other Topics Concern  . Not on file  Social History Narrative  . Not on file    Family History  Problem Relation Age of Onset  . Hypertension Mother   . Hyperlipidemia Mother   . Suicidality Brother   . Angina Paternal Grandmother     Review of Systems  Constitutional: Negative for fever and unexpected weight change.  HENT: Positive for congestion, ear pain, sinus pressure, sneezing and trouble swallowing. Negative for dental problem, nosebleeds, postnasal drip, rhinorrhea and sore throat.   Eyes: Negative for redness and itching.  Respiratory: Positive for cough and wheezing. Negative for chest tightness and shortness of breath.   Cardiovascular:  Negative for palpitations and leg swelling.  Gastrointestinal: Negative for nausea and vomiting.  Genitourinary: Negative for dysuria.  Musculoskeletal: Negative for joint swelling.  Skin: Negative for rash.  Allergic/Immunologic: Positive for environmental allergies. Negative for food allergies and immunocompromised state.  Neurological: Negative for headaches.  Hematological: Bruises/bleeds easily.  Psychiatric/Behavioral: Negative for dysphoric mood. The patient is not nervous/anxious.        Objective:   Physical Exam   Gen. Pleasant, well-nourished, in no distress, normal affect ENT - no pallor,icterus, no post nasal drip Neck: No JVD, no thyromegaly, no carotid bruits Lungs: no use of accessory muscles, no dullness to percussion, clear without rales , faint rhonchi  On forced expiration Cardiovascular: Rhythm regular, heart sounds  normal, no murmurs or gallops, no peripheral edema Abdomen: soft and non-tender, no hepatosplenomegaly, BS normal. Musculoskeletal: No deformities, no cyanosis or clubbing Neuro:  alert, non focal        Assessment & Plan:

## 2018-12-11 NOTE — Addendum Note (Signed)
Addended by: Maurene Capes on: 12/11/2018 03:32 PM   Modules accepted: Orders

## 2018-12-15 ENCOUNTER — Encounter: Payer: Self-pay | Admitting: Physician Assistant

## 2018-12-18 ENCOUNTER — Other Ambulatory Visit: Payer: Self-pay | Admitting: Physician Assistant

## 2018-12-19 NOTE — Telephone Encounter (Signed)
Unsure if this medication is for acute problem or for long term.  Please advise of refill of Meloxicam.

## 2018-12-20 ENCOUNTER — Encounter: Payer: Self-pay | Admitting: Physician Assistant

## 2018-12-20 MED ORDER — ARIPIPRAZOLE 2 MG PO TABS: 2.0000 mg | ORAL_TABLET | Freq: Two times a day (BID) | ORAL | 1 refills | Status: DC

## 2018-12-20 MED ORDER — MELOXICAM 7.5 MG PO TABS
7.5000 mg | ORAL_TABLET | Freq: Every day | ORAL | 0 refills | Status: DC
Start: 2018-12-20 — End: 2019-03-12

## 2018-12-20 NOTE — Addendum Note (Signed)
Addended by: Waldon Merl on: 12/20/2018 03:38 PM   Modules accepted: Orders

## 2019-01-29 ENCOUNTER — Encounter: Payer: Self-pay | Admitting: Physician Assistant

## 2019-01-29 ENCOUNTER — Other Ambulatory Visit: Payer: Self-pay

## 2019-01-29 ENCOUNTER — Ambulatory Visit (INDEPENDENT_AMBULATORY_CARE_PROVIDER_SITE_OTHER): Admitting: Physician Assistant

## 2019-01-29 VITALS — BP 98/62 | HR 78 | Temp 98.2°F | Resp 14 | Ht 65.0 in | Wt 207.0 lb

## 2019-01-29 DIAGNOSIS — F5101 Primary insomnia: Secondary | ICD-10-CM

## 2019-01-29 DIAGNOSIS — E559 Vitamin D deficiency, unspecified: Secondary | ICD-10-CM | POA: Diagnosis not present

## 2019-01-29 LAB — VITAMIN D 25 HYDROXY (VIT D DEFICIENCY, FRACTURES): VITD: 21.78 ng/mL — ABNORMAL LOW (ref 30.00–100.00)

## 2019-01-29 MED ORDER — TEMAZEPAM 15 MG PO CAPS: 15.0000 mg | ORAL_CAPSULE | Freq: Every evening | ORAL | 0 refills | Status: DC | PRN

## 2019-01-29 NOTE — Progress Notes (Signed)
Patient presents to clinic today c/o recurrent episodes of insomnia over the past few weeks. Notes issue with both sleep onset and sleep maintenance. Notes sleep is very non-restful. Denies change in regular activity. Has actually increased exercise but not doing this near bedtime. Is sleeping in a dark room with a sound machine. Is watching TV before bed sometimes. Is taking Melatonin but not finding it helpful. Was on Restoril previously with good results.    Past Medical History:  Diagnosis Date  . Anxiety   . Asthma   . Depression   . History of migraine    No issue in 2 years  . SVT (supraventricular tachycardia) (HCC)     Current Outpatient Medications on File Prior to Visit  Medication Sig Dispense Refill  . ARIPiprazole (ABILIFY) 2 MG tablet Take 1 tablet (2 mg total) by mouth 2 (two) times daily. 180 tablet 1  . buPROPion (WELLBUTRIN XL) 300 MG 24 hr tablet Take 1 tablet (300 mg total) by mouth daily. 90 tablet 1  . Melatonin ER 5 MG TBCR Take 1 tablet by mouth at bedtime.    . meloxicam (MOBIC) 7.5 MG tablet Take 1 tablet (7.5 mg total) by mouth daily. 15 tablet 0  . nebivolol (BYSTOLIC) 5 MG tablet Take 1 tablet (5 mg total) by mouth daily. Please keep upcoming appt in March before anymore refills. Final Attempt 90 tablet 0  . VIIBRYD 20 MG TABS TAKE 3 TABLETS DAILY (Patient taking differently: Take 40 mg by mouth daily. TAKE 2 TABLETS DAILY) 270 tablet 1  . beclomethasone (QVAR) 40 MCG/ACT inhaler Inhale into the lungs.    . Cholecalciferol (VITAMIN D3) 5000 units CAPS Take 1 capsule by mouth daily.     No current facility-administered medications on file prior to visit.     Allergies  Allergen Reactions  . Penicillins Nausea And Vomiting  . Sulfa Antibiotics Nausea And Vomiting    Family History  Problem Relation Age of Onset  . Hypertension Mother   . Hyperlipidemia Mother   . Suicidality Brother   . Angina Paternal Grandmother     Social History    Socioeconomic History  . Marital status: Married    Spouse name: Not on file  . Number of children: Not on file  . Years of education: Not on file  . Highest education level: Not on file  Occupational History  . Not on file  Social Needs  . Financial resource strain: Not on file  . Food insecurity:    Worry: Not on file    Inability: Not on file  . Transportation needs:    Medical: Not on file    Non-medical: Not on file  Tobacco Use  . Smoking status: Never Smoker  . Smokeless tobacco: Never Used  Substance and Sexual Activity  . Alcohol use: No  . Drug use: No  . Sexual activity: Yes    Birth control/protection: Pill  Lifestyle  . Physical activity:    Days per week: Not on file    Minutes per session: Not on file  . Stress: Not on file  Relationships  . Social connections:    Talks on phone: Not on file    Gets together: Not on file    Attends religious service: Not on file    Active member of club or organization: Not on file    Attends meetings of clubs or organizations: Not on file    Relationship status: Not on file  Other  Topics Concern  . Not on file  Social History Narrative  . Not on file   Review of Systems - See HPI.  All other ROS are negative.  BP 98/62   Pulse 78   Temp 98.2 F (36.8 C) (Oral)   Resp 14   Ht 5\' 5"  (1.651 m)   Wt 207 lb (93.9 kg)   SpO2 98%   BMI 34.45 kg/m   Physical Exam Vitals signs reviewed.  Constitutional:      Appearance: Normal appearance.  HENT:     Head: Normocephalic and atraumatic.  Neck:     Musculoskeletal: Neck supple.  Cardiovascular:     Rate and Rhythm: Normal rate and regular rhythm.  Lymphadenopathy:     Cervical: No cervical adenopathy.  Neurological:     Mental Status: She is alert.  Psychiatric:        Mood and Affect: Mood normal.     Assessment/Plan: Primary insomnia Restart Sleep hygiene practices. Handout given. Rx Restoril 15 mg nightly PRN. Follow-up as scheduled.    Piedad Climes, PA-C

## 2019-01-29 NOTE — Assessment & Plan Note (Addendum)
Restart Sleep hygiene practices. Handout given. Rx Restoril 15 mg nightly PRN. Follow-up as scheduled.

## 2019-01-29 NOTE — Addendum Note (Signed)
Addended by: Con Memos on: 01/29/2019 11:04 AM   Modules accepted: Orders

## 2019-01-29 NOTE — Patient Instructions (Addendum)
Please read the list below on do's and don'ts for sleep. Use the Temazepam as directed for sleep. Follow-up routinely as scheduled. Call me if symptoms are not improving.   Sleep Hygiene  Do: (1) Go to bed at the same time each day. (2) Get up from bed at the same time each day. (3) Get regular exercise each day, preferably in the morning.  There is goof evidence that regular exercise improves restful sleep.  This includes stretching and aerobic exercise. (4) Get regular exposure to outdoor or bright lights, especially in the late afternoon. (5) Keep the temperature in your bedroom comfortable. (6) Keep the bedroom quiet when sleeping. (7) Keep the bedroom dark enough to facilitate sleep. (8) Use your bed only for sleep and sex. (9) Take medications as directed.  It is helpful to take prescribed sleeping pills 1 hour before bedtime, so they are causing drowsiness when you lie down, or 10 hours before getting up, to avoid daytime drowsiness. (10) Use a relaxation exercise just before going to sleep -- imagery, massage, warm bath. (11) Keep your feet and hands warm.  Wear warm socks and/or mittens or gloves to bed.  Don't: (1) Exercise just before going to bed. (2) Engage in stimulating activity just before bed, such as playing a competitive game, watching an exciting program on television, or having an important discussion with a loved one. (3) Have caffeine in the evening (coffee, teas, chocolate, sodas, etc.) (4) Read or watch television in bed. (5) Use alcohol to help you sleep. (6) Go to bed too hungry or too full. (7) Take another person's sleeping pills. (8) Take over-the-counter sleeping pills, without your doctor's knowledge.  Tolerance can develop rapidly with these medications.  Diphenhydramine can have serious side effects for elderly patients. (9) Take daytime naps. (10) Command yourself to go to sleep.  This only makes your mind and body more alert.  If you lie awake for  more than 20-30 minutes, get up, go to a different room, participate in a quiet activity (Ex - non-excitable reading or television), and then return to bed when you feel sleepy.  Do this as many times during the night as needed.  This may cause you to have a night or two of poor sleep but it will train your brain to know when it is time for sleep.

## 2019-01-30 ENCOUNTER — Other Ambulatory Visit: Payer: Self-pay | Admitting: Cardiology

## 2019-01-30 MED ORDER — NEBIVOLOL HCL 5 MG PO TABS: 5.0000 mg | ORAL_TABLET | Freq: Every day | ORAL | 0 refills | Status: DC

## 2019-01-30 NOTE — Telephone Encounter (Signed)
New Message         *STAT* If patient is at the pharmacy, call can be transferred to refill team.   1. Which medications need to be refilled? (please list name of each medication and dose if known) Bystolic  2. Which pharmacy/location (including street and city if local pharmacy) is medication to be sent to? [facility]  3. Do they need a 30 day or 90 day supply? 90

## 2019-01-31 ENCOUNTER — Other Ambulatory Visit: Payer: Self-pay | Admitting: Physician Assistant

## 2019-01-31 MED ORDER — VITAMIN D (ERGOCALCIFEROL) 1.25 MG (50000 UNIT) PO CAPS: 50000.0000 [IU] | ORAL_CAPSULE | ORAL | 0 refills | Status: DC

## 2019-02-05 ENCOUNTER — Telehealth: Payer: Self-pay | Admitting: *Deleted

## 2019-02-05 NOTE — Telephone Encounter (Signed)
Dr. Delton See, the pt called and cancelled her appt with you on her own for next Monday 02/12/19 at 0900, due to coronavirus concern.  What priority follow-up do you advise for this pt, so I can route to the covid cancel pool, for follow-up? Thanks!

## 2019-02-07 NOTE — Telephone Encounter (Signed)
12 or more weeks

## 2019-02-07 NOTE — Telephone Encounter (Signed)
Will send this message to our Covid 19 cancel pool with a priority follow-up of # 3, per Dr Delton See.

## 2019-02-12 ENCOUNTER — Ambulatory Visit: Payer: Self-pay | Admitting: Cardiology

## 2019-02-22 ENCOUNTER — Telehealth: Payer: Self-pay

## 2019-02-22 NOTE — Telephone Encounter (Signed)
Copied from CRM [id number]. Topic: Appointment Scheduling - Scheduling Inquiry for Clinic >> Feb 22, 2019  2:33 PM Terisa Starr wrote: Reason for CRM: pt said she is having neck pain, she said she normally gets an injection. She said the anti-inflammatory is not working. She is aware Malva Cogan, PA is out of the office and the office is closed tomorrow. Patient said she may go to Urgent Care and will follow up if needed.

## 2019-02-25 ENCOUNTER — Other Ambulatory Visit: Payer: Self-pay | Admitting: Physician Assistant

## 2019-02-26 ENCOUNTER — Other Ambulatory Visit: Payer: Self-pay | Admitting: Physician Assistant

## 2019-02-26 ENCOUNTER — Encounter: Payer: Self-pay | Admitting: Physician Assistant

## 2019-02-26 DIAGNOSIS — F5101 Primary insomnia: Secondary | ICD-10-CM

## 2019-02-26 MED ORDER — TEMAZEPAM 15 MG PO CAPS: 15.0000 mg | ORAL_CAPSULE | Freq: Every evening | ORAL | 2 refills | Status: DC | PRN

## 2019-03-05 NOTE — Telephone Encounter (Signed)
Virtual Visit Pre-Appointment Phone Call  TELEPHONE CALL NOTE  Cassandra Leach has been deemed a candidate for a follow-up tele-health visit to limit community exposure during the Covid-19 pandemic. I spoke with the patient via phone to ensure availability of phone/video source, confirm preferred email & phone number, and discuss instructions and expectations.  I reminded Cassandra Leach to be prepared with any vital sign and/or heart rhythm information that could potentially be obtained via home monitoring, at the time of her visit. I reminded Cassandra Leach to expect a phone call at the time of her visit if her visit.  Patient agrees to consent below.  Lattie Haw, RN 03/05/2019 2:13 PM    FULL LENGTH CONSENT FOR TELE-HEALTH VISIT   I hereby voluntarily request, consent and authorize [facility] and its employed or contracted physicians, physician assistants, nurse practitioners or other licensed health care professionals (the Practitioner), to provide me with telemedicine health care services (the "Services") as deemed necessary by the treating Practitioner. I acknowledge and consent to receive the Services by the Practitioner via telemedicine. I understand that the telemedicine visit will involve communicating with the Practitioner through live audiovisual communication technology and the disclosure of certain medical information by electronic transmission. I acknowledge that I have been given the opportunity to request an in-person assessment or other available alternative prior to the telemedicine visit and am voluntarily participating in the telemedicine visit.  I understand that I have the right to withhold or withdraw my consent to the use of telemedicine in the course of my care at any time, without affecting my right to future care or treatment, and that the Practitioner or I may terminate the telemedicine visit at any time. I understand that I have the right to inspect all  information obtained and/or recorded in the course of the telemedicine visit and may receive copies of available information for a reasonable fee.  I understand that some of the potential risks of receiving the Services via telemedicine include:  Marland Kitchen Delay or interruption in medical evaluation due to technological equipment failure or disruption; . Information transmitted may not be sufficient (e.g. poor resolution of images) to allow for appropriate medical decision making by the Practitioner; and/or  . In rare instances, security protocols could fail, causing a breach of personal health information.  Furthermore, I acknowledge that it is my responsibility to provide information about my medical history, conditions and care that is complete and accurate to the best of my ability. I acknowledge that Practitioner's advice, recommendations, and/or decision may be based on factors not within their control, such as incomplete or inaccurate data provided by me or distortions of diagnostic images or specimens that may result from electronic transmissions. I understand that the practice of medicine is not an exact science and that Practitioner makes no warranties or guarantees regarding treatment outcomes. I acknowledge that I will receive a copy of this consent concurrently upon execution via email to the email address I last provided but may also request a printed copy by calling the office of [facility].    I understand that my insurance will be billed for this visit.   I have read or had this consent read to me. . I understand the contents of this consent, which adequately explains the benefits and risks of the Services being provided via telemedicine.  . I have been provided ample opportunity to ask questions regarding this consent and the Services and have had my questions answered to my  satisfaction. . I give my informed consent for the services to be provided through the use of telemedicine in my  medical care  By participating in this telemedicine visit I agree to the above.

## 2019-03-05 NOTE — Progress Notes (Signed)
Virtual Visit via Video Note   This visit type was conducted due to national recommendations for restrictions regarding the COVID-19 Pandemic (e.g. social distancing) in an effort to limit this patient's exposure and mitigate transmission in our community.  Due to her co-morbid illnesses, this patient is at least at moderate risk for complications without adequate follow up.  This format is felt to be most appropriate for this patient at this time.  All issues noted in this document were discussed and addressed.  A limited physical exam was performed with this format.  Please refer to the patient's chart for her consent to telehealth [facility].   Evaluation Performed:  Follow-up visit  Date:  03/06/2019   ID:  Cassandra Leach, DOB 12-31-1970, MRN [id number]  Patient Location: Home Provider Location: Home  PCP:  Waldon MerlMartin, William C, PA-C  Cardiologist:  Tobias AlexanderKatarina Nelson, MD  Electrophysiologist:  None   Chief Complaint:  Yearly f/u  History of Present Illness:    Cassandra Leach is a 48 y.o. female with with history of SVT maintained on Bystolic, could not tolerate Toprol, asthma, and overweight 2D echo 2017 [facility] showed normal LVEF 60 to 65% with mild MR. patient last saw Dr. Delton SeeNelson 11/18/2017 at which time she was doing well and advised to increase her exercise to help with weight loss and being prediabetic.  Patient had recurrent tachycardia on lower dose bystolic and had to go back up to 5 mg. No tachycardia since. She Joined weight watchers and walking 30-40 min/day. Has losts 8 lbs in the past month.      The patient does not have symptoms concerning for COVID-19 infection (fever, chills, cough, or new shortness of breath).    Past Medical History:  Diagnosis Date  . Anxiety   . Asthma   . Depression   . History of migraine    No issue in 2 years  . SVT (supraventricular tachycardia) (HCC)    Past Surgical History:  Procedure Laterality Date  . RADIOFREQUENCY  ABLATION NERVES       Current Meds  Medication Sig  . ARIPiprazole (ABILIFY) 2 MG tablet Take 1 tablet (2 mg total) by mouth 2 (two) times daily.  Marland Kitchen. buPROPion (WELLBUTRIN XL) 300 MG 24 hr tablet Take 1 tablet (300 mg total) by mouth daily.  . Cholecalciferol (VITAMIN D3) 5000 units CAPS Take 1 capsule by mouth daily.  . meloxicam (MOBIC) 7.5 MG tablet Take 1 tablet (7.5 mg total) by mouth daily.  . nebivolol (BYSTOLIC) 5 MG tablet Take 1 tablet (5 mg total) by mouth daily. Please make annual appt for future refills. Thank you  . temazepam (RESTORIL) 15 MG capsule Take 1 capsule (15 mg total) by mouth at bedtime as needed for sleep.  Marland Kitchen. VIIBRYD 20 MG TABS TAKE 3 TABLETS DAILY (Patient taking differently: Take 40 mg by mouth daily. TAKE 2 TABLETS DAILY)  . Vitamin D, Ergocalciferol, (DRISDOL) 1.25 MG (50000 UT) CAPS capsule Take 1 capsule (50,000 Units total) by mouth every 7 (seven) days.     Allergies:   Penicillins and Sulfa antibiotics   Social History   Tobacco Use  . Smoking status: Never Smoker  . Smokeless tobacco: Never Used  Substance Use Topics  . Alcohol use: No  . Drug use: No     Family Hx: The patient's family history includes Angina in her paternal grandmother; Hyperlipidemia in her mother; Hypertension in her mother; Suicidality in her brother.  ROS:   Please  see the history of present illness.    Review of Systems  Constitution: Positive for weight gain.   All other systems reviewed and are negative.   Prior CV studies:   The following studies were reviewed today: [facility] LVEF 60 to 65% with mild MR.    Labs/Other Tests and Data Reviewed:    EKG:  No ECG reviewed.  Recent Labs: No results found for requested labs within last 8760 hours.   Recent Lipid Panel Lab Results  Component Value Date/Time   CHOL 181 11/18/2017 08:01 AM   TRIG 107.0 11/18/2017 08:01 AM   HDL 38.70 (L) 11/18/2017 08:01 AM   CHOLHDL 5  11/18/2017 08:01 AM   LDLCALC 121 (H) 11/18/2017 08:01 AM    Wt Readings from Last 3 Encounters:  03/06/19 201 lb (91.2 kg)  01/29/19 207 lb (93.9 kg)  12/11/18 204 lb (92.5 kg)     Objective:    Vital Signs:  Ht 5\' 5"  (1.651 m)   Wt 201 lb (91.2 kg)   BMI 33.45 kg/m    VITAL SIGNS:  reviewed GEN:  no acute distress RESPIRATORY:  normal respiratory effort, symmetric expansion CARDIOVASCULAR:  no peripheral edema  ASSESSMENT & PLAN:    1. SVT-continue bystolic.F/u with Dr. Delton See in 1 yr 2. Overweight-joined weight watchers and walking 30-40 min daily-has lost 8 lbs so far 3. Hyperlipidemia LDL 121 HDL 38 triglycerides 107 11/2017 recommend diet and exercise to get LDL down to less than 90. She will have PCP check FLP  COVID-19 Education: The signs and symptoms of COVID-19 were discussed with the patient and how to seek care for testing (follow up with PCP or arrange E-visit).  The importance of social distancing was discussed today.  Time:   Today, I have spent  15 minutes with the patient with telehealth technology discussing the above problems.     Medication Adjustments/Labs and Tests Ordered: Current medicines are reviewed at length with the patient today.  Concerns regarding medicines are outlined above.   Tests Ordered: No orders of the defined types were placed in this encounter.   Medication Changes: No orders of the defined types were placed in this encounter.   Disposition:  Follow up in 1 year(s) Dr. Delton See  Signed, Jacolyn Reedy, PA-C  03/06/2019 10:23 AM    [facility]

## 2019-03-06 ENCOUNTER — Other Ambulatory Visit: Payer: Self-pay

## 2019-03-06 ENCOUNTER — Encounter: Payer: Self-pay | Admitting: Physician Assistant

## 2019-03-06 ENCOUNTER — Telehealth (INDEPENDENT_AMBULATORY_CARE_PROVIDER_SITE_OTHER): Admitting: Physician Assistant

## 2019-03-06 VITALS — Ht 65.0 in | Wt 201.0 lb

## 2019-03-06 DIAGNOSIS — I471 Supraventricular tachycardia: Secondary | ICD-10-CM | POA: Diagnosis not present

## 2019-03-06 DIAGNOSIS — E663 Overweight: Secondary | ICD-10-CM

## 2019-03-06 DIAGNOSIS — E782 Mixed hyperlipidemia: Secondary | ICD-10-CM

## 2019-03-06 MED ORDER — NEBIVOLOL HCL 5 MG PO TABS: 5.0000 mg | ORAL_TABLET | Freq: Every day | ORAL | 3 refills | Status: DC

## 2019-03-06 NOTE — Patient Instructions (Signed)
Medication Instructions:  Your physician recommends that you continue on your current medications as directed. Please refer to the Current Medication list given to you today.  If you need a refill on your cardiac medications before your next appointment, please call your pharmacy.   Lab work: Your physician recommends that you have a FASTING lipid profile with your Primary Care Doctor   If you have labs (blood work) drawn today and your tests are completely normal, you will receive your results only by: Marland Kitchen MyChart Message (if you have MyChart) OR . A paper copy in the mail If you have any lab test that is abnormal or we need to change your treatment, we will call you to review the results.  Testing/Procedures: None ordered  Follow-Up: At [facility], you and your health needs are our priority.  As part of our continuing mission to provide you with exceptional heart care, we have created designated Provider Care Teams.  These Care Teams include your primary Cardiologist (physician) and Advanced Practice Providers (APPs -  Physician Assistants and Nurse Practitioners) who all work together to provide you with the care you need, when you need it. . You will need a follow up appointment in 1 year.  Please call our office 2 months in advance to schedule this appointment.  You may see Tobias Alexander, MD or one of the following Advanced Practice Providers on your designated Care Team:   . Robbie Lis, PA-C . Dayna Dunn, PA-C . Jacolyn Reedy, PA-C  Any Other Special Instructions Will Be Listed Below (If Applicable).

## 2019-03-10 ENCOUNTER — Encounter: Payer: Self-pay | Admitting: Physician Assistant

## 2019-03-12 MED ORDER — VILAZODONE HCL 40 MG PO TABS: 40.0000 mg | ORAL_TABLET | Freq: Every day | ORAL | 0 refills | Status: DC

## 2019-04-04 ENCOUNTER — Encounter: Payer: Self-pay | Admitting: Physician Assistant

## 2019-04-04 DIAGNOSIS — F5101 Primary insomnia: Secondary | ICD-10-CM

## 2019-04-05 MED ORDER — BUPROPION HCL ER (XL) 150 MG PO TB24: 150.0000 mg | ORAL_TABLET | Freq: Every day | ORAL | 1 refills | Status: DC

## 2019-04-05 MED ORDER — TEMAZEPAM 15 MG PO CAPS: 15.0000 mg | ORAL_CAPSULE | Freq: Every evening | ORAL | 0 refills | Status: DC | PRN

## 2019-04-05 NOTE — Addendum Note (Signed)
Addended by: Waldon Merl on: 04/05/2019 04:06 PM   Modules accepted: Orders

## 2019-05-17 ENCOUNTER — Encounter: Payer: Self-pay | Admitting: Physician Assistant

## 2019-05-17 NOTE — Telephone Encounter (Signed)
In reviewing patient chart. You prescribed Clonazepam for patient RLS in 08/16/17. You gave rx for #30 Please advise

## 2019-05-21 MED ORDER — CLONAZEPAM 0.5 MG PO TABS: ORAL_TABLET | ORAL | 0 refills | Status: DC

## 2019-05-25 ENCOUNTER — Encounter: Payer: Self-pay | Admitting: Physician Assistant

## 2019-05-25 DIAGNOSIS — F5101 Primary insomnia: Secondary | ICD-10-CM

## 2019-05-25 MED ORDER — TEMAZEPAM 15 MG PO CAPS: 15.0000 mg | ORAL_CAPSULE | Freq: Every evening | ORAL | 2 refills | Status: DC | PRN

## 2019-05-25 NOTE — Telephone Encounter (Signed)
Last OV 01/29/19 restoril last filled 04/05/19 #60 with 0

## 2019-05-26 ENCOUNTER — Other Ambulatory Visit: Payer: Self-pay | Admitting: Physician Assistant

## 2019-07-05 ENCOUNTER — Encounter: Payer: Self-pay | Admitting: Physician Assistant

## 2019-07-06 NOTE — Telephone Encounter (Signed)
She would need appt to discuss before I would fill. Ok to schedule for Monday

## 2019-07-09 ENCOUNTER — Encounter: Payer: Self-pay | Admitting: Physician Assistant

## 2019-07-09 ENCOUNTER — Other Ambulatory Visit: Payer: Self-pay

## 2019-07-09 ENCOUNTER — Ambulatory Visit (INDEPENDENT_AMBULATORY_CARE_PROVIDER_SITE_OTHER): Admitting: Physician Assistant

## 2019-07-09 VITALS — HR 86

## 2019-07-09 DIAGNOSIS — K580 Irritable bowel syndrome with diarrhea: Secondary | ICD-10-CM

## 2019-07-09 DIAGNOSIS — K6389 Other specified diseases of intestine: Secondary | ICD-10-CM

## 2019-07-09 NOTE — Progress Notes (Signed)
I have discussed the procedure for the virtual visit with the patient who has given consent to proceed with assessment and treatment.   Clay Solum S Cleatis Fandrich, CMA     

## 2019-07-09 NOTE — Progress Notes (Signed)
Virtual Visit via Video   I connected with patient on 07/09/19 at  8:30 AM EDT by a video enabled telemedicine application and verified that I am speaking with the correct person using two identifiers.  Location patient: [place] [facility] [place] Persons participating in the virtual visit: Patient, Provider, CMA (Patina Moore)  I discussed the limitations of evaluation and management by telemedicine and the availability of in person appointments. The patient expressed understanding and agreed to proceed.  Subjective:   HPI:   Patient presents via Doxy.Me today to discuss treatment for SIBO and IBS-D. Patient endorses noting bloating, belching, excessive flatulence starting last Monday and increasing in symptomology every day. Was seen at Urgent Care last Thursday and diagnosed with flare of her SIBO. Was placed on xifaxan 550 mg TID s 14 days. Was unable to afford more than 4 days of medication due to cost. Notes she contacted the UC provider but they would not proceed with prior authorization. She was told to contact PCP. Patient endorses having this issue in the past, first diagnosis in 2013 via GI. Has had 3 flares since initial diagnosis. Notes diet has not been the best recently. Is trying to follow a FODMAP diet. Has taken the xifaxan for the past couple of days with noted improvement in symptoms.Denies new or worsening symptoms. Allergic to Penicillins, Sulfa Doxy ineffective.   ROS:   See pertinent positives and negatives per HPI.  Patient Active Problem List   Diagnosis Date Noted  . Small intestinal bacterial overgrowth 07/09/2019  . Cervicalgia 10/02/2018  . Primary insomnia 07/19/2017  . SVT (supraventricular tachycardia) (HCC) 04/01/2017  . Generalized anxiety disorder 04/01/2017  . 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 Smoking status: Never Smoker  . Smokeless tobacco: Never Used   Substance Use Topics  . Alcohol use: No    Current Outpatient Medications:  .  ARIPiprazole (ABILIFY) 2 MG tablet, Take 1 tablet (2 mg total) by mouth 2 (two) times daily., Disp: 180 tablet, Rfl: 1 .  buPROPion (WELLBUTRIN XL) 150 MG 24 hr tablet, Take 1 tablet (150 mg total) by mouth daily., Disp: 30 tablet, Rfl: 1 .  Cholecalciferol (VITAMIN D3) 5000 units CAPS, Take 1 capsule by mouth daily., Disp: , Rfl:  .  clonazePAM (KLONOPIN) 0.5 MG tablet, 1 tablet PO up to BID PRN for RLS symptoms, Disp: 30 tablet, Rfl: 0 .  nebivolol (BYSTOLIC) 5 MG tablet, Take 1 tablet (5 mg total) by mouth daily., Disp: 90 tablet, Rfl: 3 .  temazepam (RESTORIL) 15 MG capsule, Take 1-2 capsules (15-30 mg total) by mouth at bedtime as needed for sleep., Disp: 60 capsule, Rfl: 2 .  VIIBRYD 40 MG TABS, TAKE 1 TABLET DAILY, Disp: 90 tablet, Rfl: 1 .  XIFAXAN 550 MG TABS tablet, TK 1 T PO TID FOR 14 DAYS, Disp: , Rfl:   Allergies  Allergen Reactions  . Penicillins Nausea And Vomiting  . Sulfa Antibiotics Nausea And Vomiting    Objective:   Pulse 86   Patient is well-developed, well-nourished in no acute distress.  Resting comfortably at home.  Head is normocephalic, atraumatic.  No labored breathing.  Speech is clear and coherent with logical contest.  Patient is alert and oriented at baseline.   Assessment and Plan:   1. Small intestinal bacterial overgrowth 2. Irritable bowel syndrome with diarrhea No alarm signs/symptoms. Symptoms are classic for her and improving with Xifaxan.  Will work on PA to get medication approved. If denied by insurance will need to consider Azithromycin for her for SIBO or attempt trial of Doxycycline.    Leeanne Rio, [place] 07/09/2019

## 2019-08-14 ENCOUNTER — Other Ambulatory Visit: Payer: Self-pay

## 2019-08-14 ENCOUNTER — Encounter: Payer: Self-pay | Admitting: Physician Assistant

## 2019-08-14 ENCOUNTER — Ambulatory Visit (INDEPENDENT_AMBULATORY_CARE_PROVIDER_SITE_OTHER): Admitting: Physician Assistant

## 2019-08-14 VITALS — BP 120/78 | HR 73 | Temp 96.8°F | Resp 16 | Ht 65.0 in | Wt 209.0 lb

## 2019-08-14 DIAGNOSIS — J310 Chronic rhinitis: Secondary | ICD-10-CM | POA: Diagnosis not present

## 2019-08-14 DIAGNOSIS — Z23 Encounter for immunization: Secondary | ICD-10-CM

## 2019-08-14 DIAGNOSIS — R0981 Nasal congestion: Secondary | ICD-10-CM

## 2019-08-14 DIAGNOSIS — T485X5A Adverse effect of other anti-common-cold drugs, initial encounter: Secondary | ICD-10-CM | POA: Diagnosis not present

## 2019-08-14 MED ORDER — AZELASTINE HCL 0.1 % NA SOLN: 1.0000 | Freq: Two times a day (BID) | NASAL | 12 refills | Status: DC

## 2019-08-14 NOTE — Progress Notes (Signed)
Patient presents to clinic today c/o issues over the past 3 weeks with significant nasal stuffiness and pressure, worse at night. Notes she had increase in allergy symptoms for about a month due to ragweed which she always has issue with. Is not taking antihistamine. Is taking Flonase and Afrin daily for the past 5 days. Denies sinus pain, ear pain, tooth pain, cough or chest congestion. Denies fever, chills, malaise or fatigue.   Past Medical History:  Diagnosis Date  . Anxiety   . Asthma   . Depression   . History of migraine    No issue in 2 years  . SVT (supraventricular tachycardia) ([place])     Current Outpatient Medications on File Prior to Visit  Medication Sig Dispense Refill  . ARIPiprazole (ABILIFY) 2 MG tablet Take 1 tablet (2 mg total) by mouth 2 (two) times daily. 180 tablet 1  . buPROPion (WELLBUTRIN XL) 150 MG 24 hr tablet Take 1 tablet (150 mg total) by mouth daily. 30 tablet 1  . Cholecalciferol (VITAMIN D3) 5000 units CAPS Take 1 capsule by mouth daily.    . clonazePAM (KLONOPIN) 0.5 MG tablet 1 tablet PO up to BID PRN for RLS symptoms 30 tablet 0  . nebivolol (BYSTOLIC) 5 MG tablet Take 1 tablet (5 mg total) by mouth daily. 90 tablet 3  . temazepam (RESTORIL) 15 MG capsule Take 1-2 capsules (15-30 mg total) by mouth at bedtime as needed for sleep. 60 capsule 2  . VIIBRYD 40 MG TABS TAKE 1 TABLET DAILY 90 tablet 1   No current facility-administered medications on file prior to visit.     Allergies  Allergen Reactions  . Penicillins Nausea And Vomiting  . Sulfa Antibiotics Nausea And Vomiting    Family History  Problem Relation Age of Onset  . Hypertension Mother   . Hyperlipidemia Mother   . Suicidality Brother   . Angina Paternal Grandmother     Social History   Socioeconomic History  . Marital status: Married    Spouse name: Not on file  . Number of children: Not on file  . Years of education: Not on file  . Highest education level: Not on file   Occupational History  . Not on file  Social Needs  . Financial resource strain: Not on file  . Food insecurity    Worry: Not on file    Inability: Not on file  . Transportation needs    Medical: Not on file    Non-medical: Not on file  Tobacco Use  . Smoking status: Never Smoker  . Smokeless tobacco: Never Used  Substance and Sexual Activity  . Alcohol use: No  . Drug use: No  . Sexual activity: Yes    Birth control/protection: Pill  Lifestyle  . Physical activity    Days per week: Not on file    Minutes per session: Not on file  . Stress: Not on file  Relationships  . Social Herbalist on phone: Not on file    Gets together: Not on file    Attends religious service: Not on file    Active member of club or organization: Not on file    Attends meetings of clubs or organizations: Not on file    Relationship status: Not on file  Other Topics Concern  . Not on file  Social History Narrative  . Not on file   Review of Systems - See HPI.  All other ROS are negative.  BP 120/78   Pulse 73   Temp (!) 96.8 F (36 C) (Skin)   Resp 16   Ht 5\' 5"  (1.651 m)   Wt 209 lb (94.8 kg)   SpO2 98%   BMI 34.78 kg/m   Physical Exam Vitals signs reviewed.  Constitutional:      Appearance: Normal appearance.  HENT:     Head: Normocephalic and atraumatic.     Right Ear: Tympanic membrane and ear canal normal.     Left Ear: Ear canal normal.     Nose: Mucosal edema and rhinorrhea present.     Right Turbinates: Enlarged and swollen.     Left Turbinates: Enlarged and swollen.     Right Sinus: No maxillary sinus tenderness or frontal sinus tenderness.     Left Sinus: No maxillary sinus tenderness or frontal sinus tenderness.     Mouth/Throat:     Mouth: Mucous membranes are moist.  Eyes:     Conjunctiva/sclera: Conjunctivae normal.     Pupils: Pupils are equal, round, and reactive to light.  Neck:     Musculoskeletal: Neck supple.  Cardiovascular:     Rate and  Rhythm: Normal rate and regular rhythm.     Pulses: Normal pulses.     Heart sounds: Normal heart sounds.  Pulmonary:     Effort: Pulmonary effort is normal.     Breath sounds: Normal breath sounds.  Neurological:     Mental Status: She is alert.  Psychiatric:        Mood and Affect: Mood normal.    Assessment/Plan: 1. Chronic rhinitis 2. Nasal congestion due to prolonged use of decongestants Stop Afrin. Continue Flonase for nasal inflammation and to help wean off of the Afrin. Start OTC antihistamine. Rx Astelin. If not resolving will need to consider course of oral steroid.  3. Need for immunization against influenza - Flu Vaccine QUAD 36+ mos IM  4. Need for shingles vaccine - Varicella-zoster vaccine IM    , PA-C

## 2019-08-14 NOTE — Patient Instructions (Signed)
Stop the Afrin. Start a daily saline nasal rinse. Continue the Flonase and start Astelin as directed. Start a daily Allegra.  If not improving over the next week, let me know and we will start a short burst of an oral steroid.  Hang in there!  You will be contacted for assessment with GI and with Dr. Migdalia Dk office

## 2019-08-17 ENCOUNTER — Encounter: Payer: Self-pay | Admitting: Physician Assistant

## 2019-08-31 ENCOUNTER — Encounter: Payer: Self-pay | Admitting: Physician Assistant

## 2019-08-31 DIAGNOSIS — Z8719 Personal history of other diseases of the digestive system: Secondary | ICD-10-CM

## 2019-09-03 ENCOUNTER — Encounter: Payer: Self-pay | Admitting: Physician Assistant

## 2019-09-10 ENCOUNTER — Ambulatory Visit (INDEPENDENT_AMBULATORY_CARE_PROVIDER_SITE_OTHER): Admitting: Physician Assistant

## 2019-09-10 ENCOUNTER — Encounter: Payer: Self-pay | Admitting: Physician Assistant

## 2019-09-10 VITALS — HR 68 | Ht 65.0 in | Wt 200.0 lb

## 2019-09-10 DIAGNOSIS — F411 Generalized anxiety disorder: Secondary | ICD-10-CM | POA: Diagnosis not present

## 2019-09-10 MED ORDER — DIAZEPAM 2 MG PO TABS: 2.0000 mg | ORAL_TABLET | Freq: Two times a day (BID) | ORAL | 0 refills | Status: DC | PRN

## 2019-09-10 NOTE — Progress Notes (Signed)
I have discussed the procedure for the virtual visit with the patient who has given consent to proceed with assessment and treatment.   Omere Marti S Isaia Hassell, CMA     

## 2019-09-10 NOTE — Progress Notes (Signed)
Virtual Visit via Video   I connected with patient on 09/10/19 at  9:30 AM EDT by a video enabled telemedicine application and verified that I am speaking with the correct person using two identifiers.  Location patient: Home Location provider: [facility], Office Persons participating in the virtual visit: Patient, Provider, CMA (Patina Moore)  I discussed the limitations of evaluation and management by telemedicine and the availability of in person appointments. The patient expressed understanding and agreed to proceed.  Subjective:   HPI:   Patient presents via Doxy.Me for c/o increasing anxiety and depression. Has noticed increase over the past few weeks, her mother moved out of her home into a nursing facility in September and recently had a pacemaker placed. Reports becoming easily upset, ruminating thoughts, more sad than usual.  Currently taking Wellbutrin XL 150 mg QD, Viibryd 40 mg qd, Abilify 2 mg once daily, and Restoril 15 mg nightly, tolerating well. Denies chest pain, palpitations, headaches, nausea, or vomiting.  Seeing her therapist from [facility] via Telephone visits now -- seeing her once weekly which is helping. Denies SI/HI.   ROS:   See pertinent positives and negatives per HPI.  Patient Active Problem List   Diagnosis Date Noted  . Small intestinal bacterial overgrowth 07/09/2019  . Cervicalgia 10/02/2018  . Primary insomnia 07/19/2017  . SVT (supraventricular tachycardia) (HCC) 04/01/2017  . Generalized anxiety disorder 04/01/2017  . 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 Smoking status: Never Smoker  . Smokeless tobacco: Never Used  Substance Use Topics  . Alcohol use: No    Current Outpatient Medications:  .  ARIPiprazole (ABILIFY) 2 MG tablet, Take 1 tablet (2 mg total) by mouth 2 (two) times daily., Disp: 180 tablet, Rfl: 1 .  azelastine (ASTELIN) 0.1 % nasal spray, Place 1 spray into  both nostrils 2 (two) times daily. Use in each nostril as directed, Disp: 30 mL, Rfl: 12 .  buPROPion (WELLBUTRIN XL) 150 MG 24 hr tablet, Take 1 tablet (150 mg total) by mouth daily., Disp: 30 tablet, Rfl: 1 .  Cholecalciferol (VITAMIN D3) 5000 units CAPS, Take 1 capsule by mouth daily., Disp: , Rfl:  .  nebivolol (BYSTOLIC) 5 MG tablet, Take 1 tablet (5 mg total) by mouth daily., Disp: 90 tablet, Rfl: 3 .  temazepam (RESTORIL) 15 MG capsule, Take 1-2 capsules (15-30 mg total) by mouth at bedtime as needed for sleep., Disp: 60 capsule, Rfl: 2 .  VIIBRYD 40 MG TABS, TAKE 1 TABLET DAILY, Disp: 90 tablet, Rfl: 1 .  diazepam (VALIUM) 2 MG tablet, Take 1 tablet (2 mg total) by mouth every 12 (twelve) hours as needed for anxiety., Disp: 10 tablet, Rfl: 0  Allergies  Allergen Reactions  . Penicillins Nausea And Vomiting  . Sulfa Antibiotics Nausea And Vomiting    Objective:   Pulse 68   Ht 5\' 5"  (1.651 m)   Wt 200 lb (90.7 kg)   BMI 33.28 kg/m   Patient is well-developed, well-nourished in no acute distress.  Resting comfortably at home.  Head is normocephalic, atraumatic.  No labored breathing.  Speech is clear and coherent with logical content.  Patient is alert and oriented at baseline.   Assessment and Plan:   1. Generalized anxiety disorder Will increase Abilify to 2 mg BID. She has several tablets at home so will send refills to mail order pharmacy. Rx Valium 2 mg to use PRN  For more  severe acute anxiety -- short supply only given (10 tablets). Follow-up 4 weeks via video visit for reassessment.     Leeanne Rio, PA-C 09/10/2019

## 2019-09-12 ENCOUNTER — Ambulatory Visit: Admitting: Physician Assistant

## 2019-09-14 ENCOUNTER — Encounter: Payer: Self-pay | Admitting: Physician Assistant

## 2019-09-14 ENCOUNTER — Other Ambulatory Visit: Payer: Self-pay

## 2019-09-14 ENCOUNTER — Ambulatory Visit (INDEPENDENT_AMBULATORY_CARE_PROVIDER_SITE_OTHER): Admitting: Physician Assistant

## 2019-09-14 VITALS — BP 110/80 | HR 67 | Temp 97.6°F

## 2019-09-14 DIAGNOSIS — G44209 Tension-type headache, unspecified, not intractable: Secondary | ICD-10-CM | POA: Diagnosis not present

## 2019-09-14 MED ORDER — KETOROLAC TROMETHAMINE 60 MG/2ML IM SOLN
60.0000 mg | Freq: Once | INTRAMUSCULAR | Status: AC
  Administered 2019-09-14: 60 mg via INTRAMUSCULAR

## 2019-09-14 NOTE — Progress Notes (Signed)
   Virtual Visit via Video   I connected with patient on 09/14/19 at 11:00 AM EDT by a video enabled telemedicine application and verified that I am speaking with the correct person using two identifiers.  Location patient: Home Location provider: [facility], Office Persons participating in the virtual visit: Patient, Provider, PA-Student Drucilla Schmidt), CMA (Eduard Clos)  I discussed the limitations of evaluation and management by telemedicine and the availability of in person appointments. The patient expressed understanding and agreed to proceed.  Subjective:   HPI:    Patient presents via Doxy.Me for c/o headache. Pain started this AM woke her up from her sleep. Feels like generalized pressure, located bilateral temporals and around both eyes. Some neck tension. Endorses mild nausea. Took tylenol for the pain around 8:00 am without relief. Has history of seasonal allergies, well managed with Flonase and Xyzal. Denies fever, chills, dizziness, lightheadedness, sinus pressure, vomiting, photophobia, URI symptoms (congestion, sore throat, rhinorrhea), pain in neck.   ROS:   See pertinent positives and negatives per HPI.  Patient Active Problem List   Diagnosis Date Noted  . Small intestinal bacterial overgrowth 07/09/2019  . Cervicalgia 10/02/2018  . Primary insomnia 07/19/2017  . SVT (supraventricular tachycardia) ([place]) 04/01/2017  . Generalized anxiety disorder 04/01/2017  . Depression, major, single episode, mild ([place]) 04/01/2017    Social History   Tobacco Use  . Smoking status: Never Smoker  . Smokeless tobacco: Never Used  Substance Use Topics  . Alcohol use: No    Current Outpatient Medications:  .  ARIPiprazole (ABILIFY) 2 MG tablet, Take 1 tablet (2 mg total) by mouth 2 (two) times daily., Disp: 180 tablet, Rfl: 1 .  azelastine (ASTELIN) 0.1 % nasal spray, Place 1 spray into both nostrils 2 (two) times daily. Use in each nostril as directed, Disp: 30 mL,  Rfl: 12 .  buPROPion (WELLBUTRIN XL) 150 MG 24 hr tablet, Take 1 tablet (150 mg total) by mouth daily., Disp: 30 tablet, Rfl: 1 .  Cholecalciferol (VITAMIN D3) 5000 units CAPS, Take 1 capsule by mouth daily., Disp: , Rfl:  .  nebivolol (BYSTOLIC) 5 MG tablet, Take 1 tablet (5 mg total) by mouth daily., Disp: 90 tablet, Rfl: 3 .  temazepam (RESTORIL) 15 MG capsule, Take 1-2 capsules (15-30 mg total) by mouth at bedtime as needed for sleep., Disp: 60 capsule, Rfl: 2 .  VIIBRYD 40 MG TABS, TAKE 1 TABLET DAILY, Disp: 90 tablet, Rfl: 1 .  diazepam (VALIUM) 2 MG tablet, Take 1 tablet (2 mg total) by mouth every 12 (twelve) hours as needed for anxiety. (Patient not taking: Reported on 09/14/2019), Disp: 10 tablet, Rfl: 0  Allergies  Allergen Reactions  . Penicillins Nausea And Vomiting  . Sulfa Antibiotics Nausea And Vomiting    Objective:   BP 110/80   Pulse 67   Temp 97.6 F (36.4 C) (Skin)   SpO2 98%   Patient is well-developed, well-nourished in no acute distress.  Resting comfortably at home.  Head is normocephalic, atraumatic.  No labored breathing.  Speech is clear and coherent with logical content.  Patient is alert and oriented at baseline.   Assessment and Plan:   1. Tension headache Patient brought into office for Toradol injection. Supportive measures and OTC medications reviewed with patient. Continue Diazepam as directed for anxiety  as can help with muscle relaxation. Strict return and ER precautions reviewed.  - ketorolac (TORADOL) injection 60 mg .   Leeanne Rio, PA-C 09/14/2019

## 2019-09-14 NOTE — Patient Instructions (Signed)
Keep hydrated and get plenty of rest. Come in as scheduled for a Toradol injection so we can abort your headache.  I want you to make sure you are ea ting well today along with hydration and rest. Apply some topical Icy Hot or Aspercreme to the shoulders.  Can use the Diazepam to help with muscle tension if needed.   Let me know if symptoms are not resolving.   Hang in there!

## 2019-09-14 NOTE — Progress Notes (Signed)
I have discussed the procedure for the virtual visit with the patient who has given consent to proceed with assessment and treatment.   Lunna Vogelgesang S Glendine Swetz, CMA     

## 2019-09-26 ENCOUNTER — Ambulatory Visit: Admitting: Physician Assistant

## 2019-10-04 ENCOUNTER — Other Ambulatory Visit: Payer: Self-pay | Admitting: Physician Assistant

## 2019-10-16 ENCOUNTER — Ambulatory Visit (INDEPENDENT_AMBULATORY_CARE_PROVIDER_SITE_OTHER)

## 2019-10-16 ENCOUNTER — Other Ambulatory Visit: Payer: Self-pay

## 2019-10-16 DIAGNOSIS — Z23 Encounter for immunization: Secondary | ICD-10-CM

## 2019-10-16 NOTE — Progress Notes (Signed)
Cassandra Leach 48 y.o. female presents to office today for her second shingles vaccine. Administered SHINGRIX 0.5 mL IM left arm per Raiford Noble, PA. Patient tolerated well.

## 2019-10-22 ENCOUNTER — Ambulatory Visit (INDEPENDENT_AMBULATORY_CARE_PROVIDER_SITE_OTHER): Admitting: Physician Assistant

## 2019-10-22 ENCOUNTER — Other Ambulatory Visit: Payer: Self-pay

## 2019-10-22 ENCOUNTER — Encounter: Payer: Self-pay | Admitting: Physician Assistant

## 2019-10-22 VITALS — HR 76

## 2019-10-22 DIAGNOSIS — R400 Somnolence: Secondary | ICD-10-CM

## 2019-10-22 DIAGNOSIS — R0681 Apnea, not elsewhere classified: Secondary | ICD-10-CM

## 2019-10-22 DIAGNOSIS — R5382 Chronic fatigue, unspecified: Secondary | ICD-10-CM

## 2019-10-22 NOTE — Progress Notes (Signed)
I have discussed the procedure for the virtual visit with the patient who has given consent to proceed with assessment and treatment.   Jahmire Ruffins S Azul Coffie, CMA     

## 2019-10-22 NOTE — Progress Notes (Signed)
Virtual Visit via Video   I connected with patient on 10/22/19 at  1:30 PM EST by a video enabled telemedicine application and verified that I am speaking with the correct person using two identifiers.  Location patient: Home Location provider: [facility], Office Persons participating in the virtual visit: Patient, Provider, [place] (Patina Moore)  I discussed the limitations of evaluation and management by telemedicine and the availability of in person appointments. The patient expressed understanding and agreed to proceed.  Subjective:   HPI:   Patient presents via Doxy.Me today to discuss ongoing fatigue x 2+ months, worsening over the past 2 weeks. Notes mood has done well overall with current regimen. States she goes to sleep and can sleep for 8-10 hours but feels exhausted. Husband has noted snoring and some potential apneic episodes. States she does note daytime somnolence. Is really watching her diet and trying to keep active but is gaining weight despite this. Has not had labs here in > 1 year. Denies constipation but notes some cold intolerance.    ROS:   See pertinent positives and negatives per HPI.  Patient Active Problem List   Diagnosis Date Noted  . Small intestinal bacterial overgrowth 07/09/2019  . Cervicalgia 10/02/2018  . Primary insomnia 07/19/2017  . SVT (supraventricular tachycardia) ([place]) 04/01/2017  . Generalized anxiety disorder 04/01/2017  . Depression, major, single episode, mild ([place]) 04/01/2017    Social History   Tobacco Use  . Smoking status: Never Smoker  . Smokeless tobacco: Never Used  Substance Use Topics  . Alcohol use: No    Current Outpatient Medications:  .  ARIPiprazole (ABILIFY) 2 MG tablet, Take 1 tablet (2 mg total) by mouth 2 (two) times daily., Disp: 180 tablet, Rfl: 1 .  azelastine (ASTELIN) 0.1 % nasal spray, Place 1 spray into both nostrils 2 (two) times daily. Use in each nostril as directed, Disp: 30 mL, Rfl: 12 .   buPROPion (WELLBUTRIN XL) 150 MG 24 hr tablet, TAKE 1 TABLET DAILY, Disp: 90 tablet, Rfl: 1 .  Cholecalciferol (VITAMIN D3) 5000 units CAPS, Take 1 capsule by mouth daily., Disp: , Rfl:  .  diazepam (VALIUM) 2 MG tablet, Take 1 tablet (2 mg total) by mouth every 12 (twelve) hours as needed for anxiety., Disp: 10 tablet, Rfl: 0 .  nebivolol (BYSTOLIC) 5 MG tablet, Take 1 tablet (5 mg total) by mouth daily., Disp: 90 tablet, Rfl: 3 .  temazepam (RESTORIL) 15 MG capsule, Take 1-2 capsules (15-30 mg total) by mouth at bedtime as needed for sleep., Disp: 60 capsule, Rfl: 2 .  VIIBRYD 40 MG TABS, TAKE 1 TABLET DAILY, Disp: 90 tablet, Rfl: 1  Allergies  Allergen Reactions  . Penicillins Nausea And Vomiting  . Sulfa Antibiotics Nausea And Vomiting    Objective:   Pulse 76   Patient is well-developed, well-nourished in no acute distress.  Resting comfortably at home.  Head is normocephalic, atraumatic.  No labored breathing.  Speech is clear and coherent with logical content.  Patient is alert and oriented at baseline.   Assessment and Plan:   1. Chronic fatigue Suspect multifactorial including potential hypothyroidism. Will check full labs as noted below. Will also refer to sleep medicine for daytime somnolence and apniec episodes so she can have evaluation for OSA.  - CBC w/Diff; Future - Comp Met (CMET); Future - Hemoglobin A1c; Future - TSH; Future - Vitamin D (25 hydroxy); Future - B12; Future    Leeanne Rio, PA-C 10/22/2019

## 2019-10-23 ENCOUNTER — Other Ambulatory Visit: Payer: Self-pay | Admitting: Physician Assistant

## 2019-10-23 ENCOUNTER — Ambulatory Visit (INDEPENDENT_AMBULATORY_CARE_PROVIDER_SITE_OTHER)

## 2019-10-23 DIAGNOSIS — R5382 Chronic fatigue, unspecified: Secondary | ICD-10-CM | POA: Diagnosis not present

## 2019-10-23 DIAGNOSIS — F5101 Primary insomnia: Secondary | ICD-10-CM

## 2019-10-23 LAB — COMPREHENSIVE METABOLIC PANEL
ALT: 25 U/L (ref 0–35)
AST: 17 U/L (ref 0–37)
Albumin: 4 g/dL (ref 3.5–5.2)
Alkaline Phosphatase: 52 U/L (ref 39–117)
BUN: 10 mg/dL (ref 6–23)
CO2: 28 mEq/L (ref 19–32)
Calcium: 9.2 mg/dL (ref 8.4–10.5)
Chloride: 103 mEq/L (ref 96–112)
Creatinine, Ser: 0.73 mg/dL (ref 0.40–1.20)
GFR: 84.16 mL/min (ref >60.00)
Glucose, Bld: 117 mg/dL — ABNORMAL HIGH (ref 70–99)
Potassium: 4.1 mEq/L (ref 3.5–5.1)
Sodium: 136 mEq/L (ref 135–145)
Total Bilirubin: 0.4 mg/dL (ref 0.2–1.2)
Total Protein: 6.6 g/dL (ref 6.0–8.3)

## 2019-10-23 LAB — CBC WITH DIFFERENTIAL/PLATELET
Basophils Absolute: 0.1 10*3/uL (ref 0.0–0.1)
Basophils Relative: 0.9 % (ref 0.0–3.0)
Eosinophils Absolute: 0.2 10*3/uL (ref 0.0–0.7)
Eosinophils Relative: 2.2 % (ref 0.0–5.0)
HCT: 41.9 % (ref 36.0–46.0)
Hemoglobin: 13.7 g/dL (ref 12.0–15.0)
Lymphocytes Relative: 29.4 % (ref 12.0–46.0)
Lymphs Abs: 2.4 10*3/uL (ref 0.7–4.0)
MCHC: 32.6 g/dL (ref 30.0–36.0)
MCV: 88.2 fl (ref 78.0–100.0)
Monocytes Absolute: 0.6 10*3/uL (ref 0.1–1.0)
Monocytes Relative: 7.6 % (ref 3.0–12.0)
Neutro Abs: 4.9 10*3/uL (ref 1.4–7.7)
Neutrophils Relative %: 59.9 % (ref 43.0–77.0)
Platelets: 300 10*3/uL (ref 150.0–400.0)
RBC: 4.75 Mil/uL (ref 3.87–5.11)
RDW: 14.2 % (ref 11.5–15.5)
WBC: 8.2 10*3/uL (ref 4.0–10.5)

## 2019-10-23 LAB — VITAMIN D 25 HYDROXY (VIT D DEFICIENCY, FRACTURES): VITD: 31.02 ng/mL (ref 30.00–100.00)

## 2019-10-23 LAB — HEMOGLOBIN A1C: Hgb A1c MFr Bld: 5.8 % (ref 4.6–6.5)

## 2019-10-23 LAB — VITAMIN B12: Vitamin B-12: 438 pg/mL (ref 211–911)

## 2019-10-23 LAB — TSH: TSH: 2.28 u[IU]/mL (ref 0.35–4.50)

## 2019-10-23 NOTE — Telephone Encounter (Signed)
Last refill 05/25/19 #[phone] RF

## 2019-10-31 ENCOUNTER — Institutional Professional Consult (permissible substitution): Admitting: Pulmonary Disease

## 2019-11-01 ENCOUNTER — Ambulatory Visit (INDEPENDENT_AMBULATORY_CARE_PROVIDER_SITE_OTHER): Admitting: Pulmonary Disease

## 2019-11-01 ENCOUNTER — Other Ambulatory Visit: Payer: Self-pay

## 2019-11-01 ENCOUNTER — Encounter: Payer: Self-pay | Admitting: Pulmonary Disease

## 2019-11-01 VITALS — BP 108/70 | HR 83 | Ht 65.0 in | Wt 209.2 lb

## 2019-11-01 DIAGNOSIS — G4733 Obstructive sleep apnea (adult) (pediatric): Secondary | ICD-10-CM

## 2019-11-01 NOTE — Patient Instructions (Signed)
Nonrestorative sleep, possible sleep apnea  We will set you up for home sleep study We will update you as soon as results are available  CPAP will be an option of treatment if you have significant sleep apnea  Continue efforts at weight loss and exercise  We will see you in 3 months Call with significant concerns Sleep Apnea Sleep apnea is a condition in which breathing pauses or becomes shallow during sleep. Episodes of sleep apnea usually last 10 seconds or longer, and they may occur as many as 20 times an hour. Sleep apnea disrupts your sleep and keeps your body from getting the rest that it needs. This condition can increase your risk of certain health problems, including:  Heart attack.  Stroke.  Obesity.  Diabetes.  Heart failure.  Irregular heartbeat. What are the causes? There are three kinds of sleep apnea:  Obstructive sleep apnea. This kind is caused by a blocked or collapsed airway.  Central sleep apnea. This kind happens when the part of the brain that controls breathing does not send the correct signals to the muscles that control breathing.  Mixed sleep apnea. This is a combination of obstructive and central sleep apnea. The most common cause of this condition is a collapsed or blocked airway. An airway can collapse or become blocked if:  Your throat muscles are abnormally relaxed.  Your tongue and tonsils are larger than normal.  You are overweight.  Your airway is smaller than normal. What increases the risk? You are more likely to develop this condition if you:  Are overweight.  Smoke.  Have a smaller than normal airway.  Are elderly.  Are female.  Drink alcohol.  Take sedatives or tranquilizers.  Have a family history of sleep apnea. What are the signs or symptoms? Symptoms of this condition include:  Trouble staying asleep.  Daytime sleepiness and tiredness.  Irritability.  Loud snoring.  Morning headaches.  Trouble  concentrating.  Forgetfulness.  Decreased interest in sex.  Unexplained sleepiness.  Mood swings.  Personality changes.  Feelings of depression.  Waking up often during the night to urinate.  Dry mouth.  Sore throat. How is this diagnosed? This condition may be diagnosed with:  A medical history.  A physical exam.  A series of tests that are done while you are sleeping (sleep study). These tests are usually done in a sleep lab, but they may also be done at home. How is this treated? Treatment for this condition aims to restore normal breathing and to ease symptoms during sleep. It may involve managing health issues that can affect breathing, such as high blood pressure or obesity. Treatment may include:  Sleeping on your side.  Using a decongestant if you have nasal congestion.  Avoiding the use of depressants, including alcohol, sedatives, and narcotics.  Losing weight if you are overweight.  Making changes to your diet.  Quitting smoking.  Using a device to open your airway while you sleep, such as: ? An oral appliance. This is a custom-made mouthpiece that shifts your lower jaw forward. ? A continuous positive airway pressure (CPAP) device. This device blows air through a mask when you breathe out (exhale). ? A nasal expiratory positive airway pressure (EPAP) device. This device has valves that you put into each nostril. ? A bi-level positive airway pressure (BPAP) device. This device blows air through a mask when you breathe in (inhale) and breathe out (exhale).  Having surgery if other treatments do not work. During surgery, excess  tissue is removed to create a wider airway. It is important to get treatment for sleep apnea. Without treatment, this condition can lead to:  High blood pressure.  Coronary artery disease.  In men, an inability to achieve or maintain an erection (impotence).  Reduced thinking abilities. Follow these instructions at  home: Lifestyle  Make any lifestyle changes that your health care provider recommends.  Eat a healthy, well-balanced diet.  Take steps to lose weight if you are overweight.  Avoid using depressants, including alcohol, sedatives, and narcotics.  Do not use any products that contain nicotine or tobacco, such as cigarettes, e-cigarettes, and chewing tobacco. If you need help quitting, ask your health care provider. General instructions  Take over-the-counter and prescription medicines only as told by your health care provider.  If you were given a device to open your airway while you sleep, use it only as told by your health care provider.  If you are having surgery, make sure to tell your health care provider you have sleep apnea. You may need to bring your device with you.  Keep all follow-up visits as told by your health care provider. This is important. Contact a health care provider if:  The device that you received to open your airway during sleep is uncomfortable or does not seem to be working.  Your symptoms do not improve.  Your symptoms get worse. Get help right away if:  You develop: ? Chest pain. ? Shortness of breath. ? Discomfort in your back, arms, or stomach.  You have: ? Trouble speaking. ? Weakness on one side of your body. ? Drooping in your face. These symptoms may represent a serious problem that is an emergency. Do not wait to see if the symptoms will go away. Get medical help right away. Call your local emergency services (911 in the [place] Do not drive yourself to the hospital. Summary  Sleep apnea is a condition in which breathing pauses or becomes shallow during sleep.  The most common cause is a collapsed or blocked airway.  The goal of treatment is to restore normal breathing and to ease symptoms during sleep. This information is not intended to replace advice given to you by your health care provider. Make sure you discuss any questions you  have with your health care provider. Document Released: 10/22/2002 Document Revised: 08/18/2018 Document Reviewed: 06/27/2018 Elsevier Patient Education  2020 [employer].

## 2019-11-01 NOTE — Progress Notes (Signed)
Subjective:    Patient ID: Cassandra Leach, female    DOB: [birth year], 48 y.o.   MRN: [id number]  Patient is being seen for nonrestorative sleep  She has difficulty falling asleep, noted to snore, spouse as noted trouble breathing during the night  Usually tries to go to bed at 10 PM Takes about 30 minutes to 1 hour to fall asleep About 1-2 awakenings during the night Usually up at 8 AM  Sleep is nonrestorative Has gained about 50 pounds  Was recently placed on Lamictal-this helps her stay awake during the day, has noticed some difficulty falling asleep at night She is taking Restoril at night-15 mg, she does require this nightly to fall asleep  Denies dryness of the mouth in the mornings, no morning headaches or memory is fine Has no difficulty concentrating  Dad has obstructive sleep apnea  Would usually almost always take a daily nap Since being on Lamictal, does not need a nap  Never smoker Quit drinking November 2020  She is a Pharmacist, hospital  History significant for anxiety/depression   Past Medical History:  Diagnosis Date  . Anxiety   . Asthma   . Depression   . History of migraine    No issue in 2 years  . SVT (supraventricular tachycardia) (HCC)    Social History   Socioeconomic History  . Marital status: Married    Spouse name: Not on file  . Number of children: Not on file  . Years of education: Not on file  . Highest education level: Not on file  Occupational History  . Not on file  Tobacco Use  . Smoking status: Never Smoker  . Smokeless tobacco: Never Used  Substance and Sexual Activity  . Alcohol use: No  . Drug use: No  . Sexual activity: Yes    Birth control/protection: Pill  Other Topics Concern  . Not on file  Social History Narrative  . Not on file   Social Determinants of Health   Financial Resource Strain:   . Difficulty of Paying Living Expenses: Not on file  Food Insecurity:   . Worried About Charity fundraiser in the Last Year:  Not on file  . Ran Out of Food in the Last Year: Not on file  Transportation Needs:   . Lack of Transportation (Medical): Not on file  . Lack of Transportation (Non-Medical): Not on file  Physical Activity:   . Days of Exercise per Week: Not on file  . Minutes of Exercise per Session: Not on file  Stress:   . Feeling of Stress : Not on file  Social Connections:   . Frequency of Communication with Friends and Family: Not on file  . Frequency of Social Gatherings with Friends and Family: Not on file  . Attends Religious Services: Not on file  . Active Member of Clubs or Organizations: Not on file  . Attends Archivist Meetings: Not on file  . Marital Status: Not on file  Intimate Partner Violence:   . Fear of Current or Ex-Partner: Not on file  . Emotionally Abused: Not on file  . Physically Abused: Not on file  . Sexually Abused: Not on file   Family History  Problem Relation Age of Onset  . Hypertension Mother   . Hyperlipidemia Mother   . Suicidality Brother   . Angina Paternal Grandmother    Review of Systems  Constitutional: Positive for unexpected weight change. Negative for fever.  HENT: Positive for congestion  and sneezing. Negative for dental problem, ear pain, nosebleeds, postnasal drip, rhinorrhea, sinus pressure, sore throat and trouble swallowing.   Eyes: Negative for redness and itching.  Respiratory: Negative for cough, chest tightness, shortness of breath and wheezing.   Cardiovascular: Negative for palpitations and leg swelling.  Gastrointestinal: Negative for nausea and vomiting.  Genitourinary: Negative for dysuria.  Musculoskeletal: Negative for joint swelling.  Skin: Negative for rash.  Allergic/Immunologic: Negative.  Negative for environmental allergies, food allergies and immunocompromised state.  Neurological: Negative for headaches.  Hematological: Does not bruise/bleed easily.  Psychiatric/Behavioral: Negative for dysphoric mood. The  patient is nervous/anxious.       Objective:   Physical Exam Vitals:   11/01/19 1340  BP: 108/70  Pulse: 83  SpO2: 97%   Results of the Epworth flowsheet 11/01/2019  Sitting and reading 0  Watching TV 0  Sitting, inactive in a public place (e.g. a theatre or a meeting) 0  As a passenger in a car for an hour without a break 1  Lying down to rest in the afternoon when circumstances permit 3  Sitting and talking to someone 0  Sitting quietly after a lunch without alcohol 0  In a car, while stopped for a few minutes in traffic 0  Total score 4      Assessment & Plan:  .  Nonrestorative sleep -Difficulty initiating sleep -Daytime fatigue, tiredness -Witnessed difficulty breathing at night  .  Obesity -50 pound weight gain recently  Moderate probability of significant sleep disordered breathing  Pathophysiology of sleep disordered breathing discussed with patient  Treatment options for sleep disordered breathing discussed with the patient  Impact of multiple awakenings on daytime activity, energy levels discussed  Plan: .  We will schedule the patient for home sleep study  .  Importance of exercise and weight loss discussed  .  I will see her back in the office in about 3 months  .  Encouraged to call with any significant concerns

## 2019-11-12 ENCOUNTER — Other Ambulatory Visit: Payer: Self-pay

## 2019-11-13 ENCOUNTER — Ambulatory Visit (INDEPENDENT_AMBULATORY_CARE_PROVIDER_SITE_OTHER): Admitting: Women's Health

## 2019-11-13 ENCOUNTER — Encounter: Payer: Self-pay | Admitting: Women's Health

## 2019-11-13 VITALS — BP 110/73 | Ht 65.0 in | Wt 211.2 lb

## 2019-11-13 DIAGNOSIS — Z01419 Encounter for gynecological examination (general) (routine) without abnormal findings: Secondary | ICD-10-CM | POA: Diagnosis not present

## 2019-11-13 DIAGNOSIS — I10 Essential (primary) hypertension: Secondary | ICD-10-CM | POA: Insufficient documentation

## 2019-11-13 NOTE — Patient Instructions (Addendum)
[facility] center  [phone] [facility]  [phone]  Dr Hebert Soho D 2000 iu daily Nice to meet you today!  Health Maintenance, Female Adopting a healthy lifestyle and getting preventive care are important in promoting health and wellness. Ask your health care provider about:  The right schedule for you to have regular tests and exams.  Things you can do on your own to prevent diseases and keep yourself healthy. What should I know about diet, weight, and exercise? Eat a healthy diet   Eat a diet that includes plenty of vegetables, fruits, low-fat dairy products, and lean protein.  Do not eat a lot of foods that are high in solid fats, added sugars, or sodium. Maintain a healthy weight Body mass index (BMI) is used to identify weight problems. It estimates body fat based on height and weight. Your health care provider can help determine your BMI and help you achieve or maintain a healthy weight. Get regular exercise Get regular exercise. This is one of the most important things you can do for your health. Most adults should:  Exercise for at least 150 minutes each week. The exercise should increase your heart rate and make you sweat (moderate-intensity exercise).  Do strengthening exercises at least twice a week. This is in addition to the moderate-intensity exercise.  Spend less time sitting. Even light physical activity can be beneficial. Watch cholesterol and blood lipids Have your blood tested for lipids and cholesterol at 50 years of age, then have this test every 5 years. Have your cholesterol levels checked more often if:  Your lipid or cholesterol levels are high.  You are older than 50 years of age.  You are at high risk for heart disease. What should I know about cancer screening? Depending on your health history and family history, you may need to have cancer screening at various ages. This may include screening for:  Breast cancer.  Cervical cancer.  Colorectal  cancer.  Skin cancer.  Lung cancer. What should I know about heart disease, diabetes, and high blood pressure? Blood pressure and heart disease  High blood pressure causes heart disease and increases the risk of stroke. This is more likely to develop in people who have high blood pressure readings, are of African descent, or are overweight.  Have your blood pressure checked: ? Every 3-5 years if you are 81-47 years of age. ? Every year if you are 21 years old or older. Diabetes Have regular diabetes screenings. This checks your fasting blood sugar level. Have the screening done:  Once every three years after age 67 if you are at a normal weight and have a low risk for diabetes.  More often and at a younger age if you are overweight or have a high risk for diabetes. What should I know about preventing infection? Hepatitis B If you have a higher risk for hepatitis B, you should be screened for this virus. Talk with your health care provider to find out if you are at risk for hepatitis B infection. Hepatitis C Testing is recommended for:  Everyone born from [birth year] through 1967.  Anyone with known risk factors for hepatitis C. Sexually transmitted infections (STIs)  Get screened for STIs, including gonorrhea and chlamydia, if: ? You are sexually active and are younger than 50 years of age. ? You are older than 50 years of age and your health care provider tells you that you are at risk for this type of infection. ? Your sexual activity has  changed since you were last screened, and you are at increased risk for chlamydia or gonorrhea. Ask your health care provider if you are at risk.  Ask your health care provider about whether you are at high risk for HIV. Your health care provider may recommend a prescription medicine to help prevent HIV infection. If you choose to take medicine to prevent HIV, you should first get tested for HIV. You should then be tested every 3 months for as long as  you are taking the medicine. Pregnancy  If you are about to stop having your period (premenopausal) and you may become pregnant, seek counseling before you get pregnant.  Take 400 to 800 micrograms (mcg) of folic acid every day if you become pregnant.  Ask for birth control (contraception) if you want to prevent pregnancy. Osteoporosis and menopause Osteoporosis is a disease in which the bones lose minerals and strength with aging. This can result in bone fractures. If you are 52 years old or older, or if you are at risk for osteoporosis and fractures, ask your health care provider if you should:  Be screened for bone loss.  Take a calcium or vitamin D supplement to lower your risk of fractures.  Be given hormone replacement therapy (HRT) to treat symptoms of menopause. Follow these instructions at home: Lifestyle  Do not use any products that contain nicotine or tobacco, such as cigarettes, e-cigarettes, and chewing tobacco. If you need help quitting, ask your health care provider.  Do not use street drugs.  Do not share needles.  Ask your health care provider for help if you need support or information about quitting drugs. Alcohol use  Do not drink alcohol if: ? Your health care provider tells you not to drink. ? You are pregnant, may be pregnant, or are planning to become pregnant.  If you drink alcohol: ? Limit how much you use to 0-1 drink a day. ? Limit intake if you are breastfeeding.  Be aware of how much alcohol is in your drink. In the [place] one drink equals one 12 oz bottle of beer (355 mL), one 5 oz glass of wine (148 mL), or one 1 oz glass of hard liquor (44 mL). General instructions  Schedule regular health, dental, and eye exams.  Stay current with your vaccines.  Tell your health care provider if: ? You often feel depressed. ? You have ever been abused or do not feel safe at home. Summary  Adopting a healthy lifestyle and getting preventive care are  important in promoting health and wellness.  Follow your health care provider's instructions about healthy diet, exercising, and getting tested or screened for diseases.  Follow your health care provider's instructions on monitoring your cholesterol and blood pressure. This information is not intended to replace advice given to you by your health care provider. Make sure you discuss any questions you have with your health care provider. Document Released: 05/17/2011 Document Revised: 10/25/2018 Document Reviewed: 10/25/2018 Elsevier Patient Education  2020 [employer].

## 2019-11-13 NOTE — Addendum Note (Signed)
Addended by: Thamas Jaegers on: 11/13/2019 11:50 AM   Modules accepted: Orders

## 2019-11-13 NOTE — Progress Notes (Signed)
Cassandra Leach 1971-01-09 [id number]    History:    Presents for new patient annual exam.  Regular monthly cycle/vasectomy/ minimal menopausal symptoms.  Abnormal Pap in the early 20s cryo, normal Paps after. History of a negative breast biopsy overdue for both Pap and mammogram.  Recently moved to the area, husband  retired Nature conservation officer.  Primary care manages hypertension, anxiety/depression.  Has not had a screening colonoscopy.  Past medical history, past surgical history, family history and social history were all reviewed and documented in the EPIC chart.  Retired Pharmacist, hospital.  Mother hypertension and hypercholesteremia.  Brother deceased suicide.  Originally from [place].  ROS:  A ROS was performed and pertinent positives and negatives are included.  Exam:  Vitals:   11/13/19 1104  BP: 110/73  Weight: 211 lb 3.2 oz (95.8 kg)  Height: 5\' 5"  (1.651 m)   Body mass index is 35.15 kg/m.   General appearance:  Normal Thyroid:  Symmetrical, normal in size, without palpable masses or nodularity. Respiratory  Auscultation:  Clear without wheezing or rhonchi Cardiovascular  Auscultation:  Regular rate, without rubs, murmurs or gallops  Edema/varicosities:  Not grossly evident Abdominal  Soft,nontender, without masses, guarding or rebound.  Liver/spleen:  No organomegaly noted  Hernia:  None appreciated  Skin  Inspection:  Grossly normal   Breasts: Examined lying and sitting.     Right: Without masses, retractions, discharge or axillary adenopathy.     Left: Without masses, retractions, discharge or axillary adenopathy. Gentitourinary   Inguinal/mons:  Normal without inguinal adenopathy  External genitalia:  Normal  BUS/Urethra/Skene's glands:  Normal  Vagina:  Normal  Cervix:  Normal  Uterus:  normal in size, shape and contour.  Midline and mobile  Adnexa/parametria:     Rt: Without masses or tenderness.   Lt: Without masses or tenderness.  Anus and perineum: Normal  Digital rectal  exam: Normal sphincter tone without palpated masses or tenderness  Assessment/Plan:  48 y.o. MWF G0 for new patient annual exam with no complaints.  Monthly cycle/vasectomy/minimal menopausal symptoms Hypertension, anxiety/depression-primary care managing labs and meds Obesity  Plan: SBEs, annual screening mammogram reviewed and encouraged, breast center information instructed to schedule.  Screening colonoscopy, [facility] information given instructed to schedule.  Aware of and need to increase exercise and decrease calorie/carbs.  Vitamin D 2000 daily encouraged.  Pap with HR HPV typing, screening guidelines reviewed.     [place], 11:12 AM 11/13/2019

## 2019-11-15 LAB — PAP, TP IMAGING W/ HPV RNA, RFLX HPV TYPE 16,18/45: HPV DNA High Risk: NOT DETECTED

## 2019-11-19 ENCOUNTER — Encounter: Payer: Self-pay | Admitting: Physician Assistant

## 2019-12-05 ENCOUNTER — Ambulatory Visit: Admitting: Physician Assistant

## 2019-12-06 ENCOUNTER — Ambulatory Visit (INDEPENDENT_AMBULATORY_CARE_PROVIDER_SITE_OTHER): Admitting: Physician Assistant

## 2019-12-06 ENCOUNTER — Other Ambulatory Visit: Payer: Self-pay

## 2019-12-06 ENCOUNTER — Encounter: Payer: Self-pay | Admitting: Physician Assistant

## 2019-12-06 DIAGNOSIS — G43009 Migraine without aura, not intractable, without status migrainosus: Secondary | ICD-10-CM | POA: Diagnosis not present

## 2019-12-06 MED ORDER — TIZANIDINE HCL 4 MG PO TABS: 4.0000 mg | ORAL_TABLET | Freq: Three times a day (TID) | ORAL | 0 refills | Status: DC | PRN

## 2019-12-06 MED ORDER — KETOROLAC TROMETHAMINE 10 MG PO TABS: ORAL_TABLET | ORAL | 0 refills | Status: DC

## 2019-12-06 MED ORDER — ONDANSETRON HCL 4 MG PO TABS: 4.0000 mg | ORAL_TABLET | Freq: Three times a day (TID) | ORAL | 0 refills | Status: DC | PRN

## 2019-12-06 NOTE — Patient Instructions (Signed)
Instructions sent to MyChart  Please keep well-hydrated and get plenty of rest. Please take the Toradol as directed. The Zofran is to use as directed if needed for nausea.  I have sent in a refill of your tizanidine.  Try to avoid bright lights. Napping is encouraged. Follow-up if symptoms not resolving.  Hang in there!

## 2019-12-06 NOTE — Progress Notes (Signed)
Virtual Visit via Video   I connected with patient on 12/06/19 at 10:30 AM EST by a video enabled telemedicine application and verified that I am speaking with the correct person using two identifiers.  Location patient: Home Location provider: [facility] Persons participating in the virtual visit: Patient, Provider, [place] (Patina Moore)  I discussed the limitations of evaluation and management by telemedicine and the availability of in person appointments. The patient expressed understanding and agreed to proceed.  Subjective:   HPI:   Patient presents via doxy doxy.me today complaining of 1 day of headache, mainly in the left temporal region.  Associated with some mild nausea and sensitivity to light.  Patient states symptoms worsen last night.  Denies vision change, altered mental status, emesis.  Denies neck tension.  Denies any recent change in diet.  Is staying hydrated.  Denies known trigger.  Has taken Tylenol and meloxicam without resolution of headache.  ROS:   See pertinent positives and negatives per HPI.  Patient Active Problem List   Diagnosis Date Noted  . Hypertension 11/13/2019  . Small intestinal bacterial overgrowth 07/09/2019  . Cervicalgia 10/02/2018  . Primary insomnia 07/19/2017  . SVT (supraventricular tachycardia) ([place]) 04/01/2017  . Generalized anxiety disorder 04/01/2017  . 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 Smoking status: Never Smoker  . Smokeless tobacco: Never Used  Substance Use Topics  . Alcohol use: Yes    Comment: OCC    Current Outpatient Medications:  .  ARIPiprazole (ABILIFY) 2 MG tablet, Take 1 tablet (2 mg total) by mouth 2 (two) times daily., Disp: 180 tablet, Rfl: 1 .  azelastine (ASTELIN) 0.1 % nasal spray, Place 1 spray into both nostrils 2 (two) times daily. Use in each nostril as directed, Disp: 30 mL, Rfl: 12 .  buPROPion (WELLBUTRIN XL) 150 MG 24 hr  tablet, TAKE 1 TABLET DAILY, Disp: 90 tablet, Rfl: 1 .  Cholecalciferol (VITAMIN D3) 5000 units CAPS, Take 1 capsule by mouth daily., Disp: , Rfl:  .  diazepam (VALIUM) 2 MG tablet, Take 1 tablet (2 mg total) by mouth every 12 (twelve) hours as needed for anxiety., Disp: 10 tablet, Rfl: 0 .  lamoTRIgine (LAMICTAL) 25 MG tablet, Take 25 mg by mouth daily., Disp: , Rfl:  .  nebivolol (BYSTOLIC) 5 MG tablet, Take 1 tablet (5 mg total) by mouth daily., Disp: 90 tablet, Rfl: 3 .  temazepam (RESTORIL) 15 MG capsule, TAKE 1 TO 2 CAPSULES(15 TO 30 MG) BY MOUTH AT BEDTIME AS NEEDED FOR SLEEP, Disp: 60 capsule, Rfl: 2 .  VIIBRYD 40 MG TABS, TAKE 1 TABLET DAILY, Disp: 90 tablet, Rfl: 1 .  ketorolac (TORADOL) 10 MG tablet, Take 2 tablets by mouth once. Can take an additional tablet in 6 hours. No more than 40 mg/day, Disp: 5 tablet, Rfl: 0 .  ondansetron (ZOFRAN) 4 MG tablet, Take 1 tablet (4 mg total) by mouth every 8 (eight) hours as needed for nausea or vomiting., Disp: 20 tablet, Rfl: 0 .  tiZANidine (ZANAFLEX) 4 MG tablet, Take 1 tablet (4 mg total) by mouth every 8 (eight) hours as needed for muscle spasms., Disp: 10 tablet, Rfl: 0  Allergies  Allergen Reactions  . Penicillins Nausea And Vomiting  . Sulfa Antibiotics Nausea And Vomiting    Objective:   There were no vitals taken for this visit.  Patient is well-developed, well-nourished in no acute distress.  Resting comfortably  at home.  Head is normocephalic, atraumatic.  No labored breathing.  Speech is clear and coherent with logical content.  Patient is alert and oriented at baseline.   Assessment and Plan:   1. Migraine without aura and without status migrainosus, not intractable No alarm signs or symptoms.  Without status migrainosus.  Rx Toradol to use as directed.  Rx Zofran for nausea and tizanidine if any muscle tension develops, as patient also has history of tension headaches.  Supportive measures and OTC medications reviewed.   Follow-up in office if not resolving.   Piedad Climes, PA-C 12/06/2019

## 2019-12-06 NOTE — Progress Notes (Signed)
I have discussed the procedure for the virtual visit with the patient who has given consent to proceed with assessment and treatment.   Jermiah Soderman S W[place] Shannahan, CMA     

## 2019-12-11 ENCOUNTER — Encounter (INDEPENDENT_AMBULATORY_CARE_PROVIDER_SITE_OTHER): Payer: Self-pay

## 2019-12-19 ENCOUNTER — Ambulatory Visit (INDEPENDENT_AMBULATORY_CARE_PROVIDER_SITE_OTHER): Admitting: Family Medicine

## 2019-12-26 ENCOUNTER — Other Ambulatory Visit: Payer: Self-pay

## 2019-12-26 ENCOUNTER — Ambulatory Visit

## 2019-12-26 DIAGNOSIS — G4733 Obstructive sleep apnea (adult) (pediatric): Secondary | ICD-10-CM

## 2019-12-28 DIAGNOSIS — G4733 Obstructive sleep apnea (adult) (pediatric): Secondary | ICD-10-CM

## 2020-01-01 ENCOUNTER — Telehealth: Payer: Self-pay | Admitting: Pulmonary Disease

## 2020-01-01 DIAGNOSIS — G4733 Obstructive sleep apnea (adult) (pediatric): Secondary | ICD-10-CM

## 2020-01-01 NOTE — Telephone Encounter (Signed)
Dr. Wynona Neat has reviewed the home sleep test this showed mild sleep apnea.   Recommendations   Treatment options are CPAP with the settings auto 5 to 15.    Oral device may be considered as a option for treatment for mild sleep apnea.  Weight loss measures .   Advise against driving while sleepy & against medication with sedative side effects.    Make appointment for 3 months for compliance with download with Dr. Wynona Neat.   Called and spoke with Patient.  Dr.Olalere's results and recommendations given. Understanding stated. Patient aware of 3 month follow up with Dr. Wynona Neat or NP, within 3 months, for insurance compliance.  Patient has current recall with Dr. Wynona Neat. DME order placed.  Nothing further at this time.

## 2020-01-02 ENCOUNTER — Ambulatory Visit (INDEPENDENT_AMBULATORY_CARE_PROVIDER_SITE_OTHER): Admitting: Family Medicine

## 2020-01-08 ENCOUNTER — Telehealth: Payer: Self-pay | Admitting: Pulmonary Disease

## 2020-01-08 NOTE — Telephone Encounter (Signed)
Called the patient back and advised that the order was placed 01/01/20 and if she has not heard from [facility] by 01/11/20 to call them the following Monday to get a status on the order placed.  Patient asked for me to call back and leave the contact number on her vmail as she was driving and unable to write the number down.  Called back and left contact number [phone].  Nothing further needed at this time.

## 2020-01-18 ENCOUNTER — Ambulatory Visit (INDEPENDENT_AMBULATORY_CARE_PROVIDER_SITE_OTHER): Admitting: Physician Assistant

## 2020-01-18 ENCOUNTER — Telehealth: Payer: Self-pay

## 2020-01-18 ENCOUNTER — Encounter: Payer: Self-pay | Admitting: Physician Assistant

## 2020-01-18 ENCOUNTER — Other Ambulatory Visit: Payer: Self-pay

## 2020-01-18 DIAGNOSIS — G44209 Tension-type headache, unspecified, not intractable: Secondary | ICD-10-CM | POA: Diagnosis not present

## 2020-01-18 MED ORDER — KETOROLAC TROMETHAMINE 10 MG PO TABS: ORAL_TABLET | ORAL | 0 refills | Status: DC

## 2020-01-18 NOTE — Progress Notes (Signed)
I have discussed the procedure for the virtual visit with the patient who has given consent to proceed with assessment and treatment.   Jocelyne Reinertsen S Kelson [place], CMA     

## 2020-01-18 NOTE — Telephone Encounter (Signed)
Patient called in and stated the pharmacy did not have her medication. She would like to know if she could just come here and get a toradol injection. Please advise.

## 2020-01-18 NOTE — Progress Notes (Signed)
Virtual Visit via Video   I connected with patient on 01/18/20 at 11:00 AM EST by a video enabled telemedicine application and verified that I am speaking with the correct person using two identifiers.  Location patient: Home Location provider: [facility], Office Persons participating in the virtual visit: Patient, Provider, [place] (Patina Moore)  I discussed the limitations of evaluation and management by telemedicine and the availability of in person appointments. The patient expressed understanding and agreed to proceed.  Subjective:   HPI:   Patient presents via Doxy.Me today c/o 1.5 days of headache described as temporal and sides of head with some mild neck tension. Some sinus pressure x 2 days but restarted Flonase and this has improved significantly. Denies fever, chills, nausea/vomiting. Notes she took an Excedrin migraine yesterday which helped some but did not fully resolve the headache. Notes having it still this morning so she wanted it checked out before the weekend.   ROS:   See pertinent positives and negatives per HPI.  Patient Active Problem List   Diagnosis Date Noted  . Hypertension 11/13/2019  . Small intestinal bacterial overgrowth 07/09/2019  . Cervicalgia 10/02/2018  . Primary insomnia 07/19/2017  . SVT (supraventricular tachycardia) ([place]) 04/01/2017  . Generalized anxiety disorder 04/01/2017  . 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 Smoking status: Never Smoker  . Smokeless tobacco: Never Used  Substance Use Topics  . Alcohol use: Yes    Comment: OCC    Current Outpatient Medications:  .  ARIPiprazole (ABILIFY) 2 MG tablet, Take 1 tablet (2 mg total) by mouth 2 (two) times daily., Disp: 180 tablet, Rfl: 1 .  azelastine (ASTELIN) 0.1 % nasal spray, Place 1 spray into both nostrils 2 (two) times daily. Use in each nostril as directed, Disp: 30 mL, Rfl: 12 .  buPROPion (WELLBUTRIN XL) 150  MG 24 hr tablet, TAKE 1 TABLET DAILY, Disp: 90 tablet, Rfl: 1 .  Cholecalciferol (VITAMIN D3) 5000 units CAPS, Take 1 capsule by mouth daily., Disp: , Rfl:  .  diazepam (VALIUM) 2 MG tablet, Take 1 tablet (2 mg total) by mouth every 12 (twelve) hours as needed for anxiety., Disp: 10 tablet, Rfl: 0 .  ketorolac (TORADOL) 10 MG tablet, Take 2 tablets by mouth once. Can take an additional tablet in 6 hours. No more than 40 mg/day, Disp: 5 tablet, Rfl: 0 .  lamoTRIgine (LAMICTAL) 25 MG tablet, Take 25 mg by mouth daily., Disp: , Rfl:  .  nebivolol (BYSTOLIC) 5 MG tablet, Take 1 tablet (5 mg total) by mouth daily., Disp: 90 tablet, Rfl: 3 .  ondansetron (ZOFRAN) 4 MG tablet, Take 1 tablet (4 mg total) by mouth every 8 (eight) hours as needed for nausea or vomiting., Disp: 20 tablet, Rfl: 0 .  temazepam (RESTORIL) 15 MG capsule, TAKE 1 TO 2 CAPSULES(15 TO 30 MG) BY MOUTH AT BEDTIME AS NEEDED FOR SLEEP, Disp: 60 capsule, Rfl: 2 .  tiZANidine (ZANAFLEX) 4 MG tablet, Take 1 tablet (4 mg total) by mouth every 8 (eight) hours as needed for muscle spasms., Disp: 10 tablet, Rfl: 0 .  VIIBRYD 40 MG TABS, TAKE 1 TABLET DAILY, Disp: 90 tablet, Rfl: 1  Allergies  Allergen Reactions  . Penicillins Nausea And Vomiting  . Sulfa Antibiotics Nausea And Vomiting    Objective:   There were no vitals taken for this visit.  Patient is well-developed, well-nourished in no acute distress.  Resting  comfortably at home.  Head is normocephalic, atraumatic.  No labored breathing.  Speech is clear and coherent with logical content.  Patient is alert and oriented at baseline.   Assessment and Plan:   1. Tension headache No alarm signs/symptoms. Supportive measures and OTC medications reviewed. Will have her start Tizanidine later today once home from work. Toradol refilled for abortive therapy. Follow-up if not resolving.     Piedad Climes, PA-C 01/18/2020

## 2020-01-18 NOTE — Telephone Encounter (Signed)
Called pharmacy and staff was able to confirm that patient picked up the ketoralac.

## 2020-01-18 NOTE — Telephone Encounter (Signed)
Did not have her medication as in they were out of stock or did not receive the prescription? If they did not receive, ok to resent electronically. If they are out of medication ok to put her on nurse schedule for Toradol injection.

## 2020-01-30 ENCOUNTER — Other Ambulatory Visit: Payer: Self-pay | Admitting: Emergency Medicine

## 2020-01-30 DIAGNOSIS — F5101 Primary insomnia: Secondary | ICD-10-CM

## 2020-01-30 MED ORDER — TEMAZEPAM 15 MG PO CAPS: ORAL_CAPSULE | ORAL | 2 refills | Status: DC

## 2020-01-30 NOTE — Telephone Encounter (Signed)
Temazepam last rx 10/24/19 #60 2 RF LOV: 01/18/20 Tension headache

## 2020-01-31 ENCOUNTER — Telehealth: Payer: Self-pay | Admitting: Pulmonary Disease

## 2020-01-31 DIAGNOSIS — G4733 Obstructive sleep apnea (adult) (pediatric): Secondary | ICD-10-CM

## 2020-01-31 NOTE — Telephone Encounter (Signed)
Called and spoke to pt. Pt is requesting a higher initial pressure for her auto range. Pt states when she tries to fall asleep it is difficult to breath because the initial pressure doesn't feel adequate. Pt is currently at auto 5-15. Pt states after she does fall asleep she doesn't have any issues with the pressure and is able to stay asleep throughout the night. Pt denies issues with machine and mask. Pt is in [place], her set up date was on 01/21/2020.   Dr. Wynona Neat please advise. Thanks.

## 2020-02-01 ENCOUNTER — Other Ambulatory Visit: Payer: Self-pay

## 2020-02-01 ENCOUNTER — Telehealth (INDEPENDENT_AMBULATORY_CARE_PROVIDER_SITE_OTHER): Admitting: Physician Assistant

## 2020-02-01 ENCOUNTER — Encounter: Payer: Self-pay | Admitting: Physician Assistant

## 2020-02-01 DIAGNOSIS — H6982 Other specified disorders of Eustachian tube, left ear: Secondary | ICD-10-CM | POA: Diagnosis not present

## 2020-02-01 NOTE — Patient Instructions (Signed)
Instructions sent to MyChart

## 2020-02-01 NOTE — Progress Notes (Signed)
Virtual Visit via Video   I connected with patient on 02/01/20 at  1:00 PM EDT by a video enabled telemedicine application and verified that I am speaking with the correct person using two identifiers.  Location patient: Home Location provider: [facility], Office Persons participating in the virtual visit: Patient, Provider, CMA (Patina Moore)  I discussed the limitations of evaluation and management by telemedicine and the availability of in person appointments. The patient expressed understanding and agreed to proceed.  Subjective:   HPI:   Patient presents via Caregility today complaining of 1 day of intermittent left ear/neck discomfort, only noted with swallowing.  Denies sore throat.  Denies increase in nasal congestion from her baseline with seasonal allergies.  Feels popping in the ear with swallowing.  Denies any drainage from the ear or loss of hearing.  Denies fever, chills, malaise.  Denies feeling any lymph nodes.  Neck is not tender when she pushes on the area.  Is taking her Flonase and Zyrtec daily for allergies.  ROS:   See pertinent positives and negatives per HPI.  Patient Active Problem List   Diagnosis Date Noted  . Hypertension 11/13/2019  . Small intestinal bacterial overgrowth 07/09/2019  . Cervicalgia 10/02/2018  . Primary insomnia 07/19/2017  . SVT (supraventricular tachycardia) (HCC) 04/01/2017  . Generalized anxiety disorder 04/01/2017  . 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 Smoking status: Never Smoker  . Smokeless tobacco: Never Used  Substance Use Topics  . Alcohol use: Yes    Comment: OCC    Current Outpatient Medications:  .  ARIPiprazole (ABILIFY) 10 MG tablet, Take 10 mg by mouth daily., Disp: , Rfl:  .  azelastine (ASTELIN) 0.1 % nasal spray, Place 1 spray into both nostrils 2 (two) times daily. Use in each nostril as directed, Disp: 30 mL, Rfl: 12 .  Cholecalciferol  (VITAMIN D3) 5000 units CAPS, Take 1 capsule by mouth daily., Disp: , Rfl:  .  desvenlafaxine (PRISTIQ) 50 MG 24 hr tablet, Take 50 mg by mouth daily., Disp: , Rfl:  .  diazepam (VALIUM) 2 MG tablet, Take 1 tablet (2 mg total) by mouth every 12 (twelve) hours as needed for anxiety., Disp: 10 tablet, Rfl: 0 .  fluticasone (FLONASE) 50 MCG/ACT nasal spray, Place into both nostrils daily., Disp: , Rfl:  .  ketorolac (TORADOL) 10 MG tablet, Take 2 tablets by mouth once. Can take an additional tablet in 6 hours. No more than 40 mg/day, Disp: 5 tablet, Rfl: 0 .  lamoTRIgine (LAMICTAL) 25 MG tablet, Take 25 mg by mouth daily., Disp: , Rfl:  .  levothyroxine (SYNTHROID) 25 MCG tablet, Take 25 mcg by mouth every morning., Disp: , Rfl:  .  nebivolol (BYSTOLIC) 5 MG tablet, Take 1 tablet (5 mg total) by mouth daily., Disp: 90 tablet, Rfl: 3 .  ondansetron (ZOFRAN) 4 MG tablet, Take 1 tablet (4 mg total) by mouth every 8 (eight) hours as needed for nausea or vomiting., Disp: 20 tablet, Rfl: 0 .  temazepam (RESTORIL) 15 MG capsule, Take 1 to 2 capsules by mouth at bedtime as needed for sleep, Disp: 60 capsule, Rfl: 2 .  tiZANidine (ZANAFLEX) 4 MG tablet, Take 1 tablet (4 mg total) by mouth every 8 (eight) hours as needed for muscle spasms., Disp: 10 tablet, Rfl: 0 .  VIIBRYD 40 MG TABS, TAKE 1 TABLET DAILY, Disp: 90 tablet, Rfl: 1  Allergies  Allergen Reactions  .  Penicillins Nausea And Vomiting  . Sulfa Antibiotics Nausea And Vomiting    Objective:   There were no vitals taken for this visit.  Patient is well-developed, well-nourished in no acute distress.  Resting comfortably at home.  Head is normocephalic, atraumatic.  No labored breathing.  Speech is clear and coherent with logical content.  Patient is alert and oriented at baseline.   Assessment and Plan:   1. Dysfunction of left eustachian tube Suspect mild eustachian tube dysfunction as cause of her intermittent otalgia especially giving  a popping sensation noted with swallowing.  No alarm signs or symptoms present.  We will have her continue Zyrtec and Flonase.  Need to avoid systemic decongestants given history of SVT.  We will have her use OTC Afrin for the next 2 days.  We will have her follow-up in office if symptoms or not resolving.    Leeanne Rio, PA-C 02/01/2020

## 2020-02-01 NOTE — Progress Notes (Signed)
I have discussed the procedure for the virtual visit with the patient who has given consent to proceed with assessment and treatment.   Valentina Alcoser S Dasan Hardman, CMA     

## 2020-02-04 NOTE — Telephone Encounter (Signed)
Change pressure to 10-15

## 2020-02-04 NOTE — Telephone Encounter (Signed)
Called and spoke with Patient.  Patient aware of cpap pressure changes. New DME order placed.  Nothing further at this time.

## 2020-02-14 ENCOUNTER — Other Ambulatory Visit: Payer: Self-pay | Admitting: Family Medicine

## 2020-02-14 DIAGNOSIS — Z1231 Encounter for screening mammogram for malignant neoplasm of breast: Secondary | ICD-10-CM

## 2020-02-20 ENCOUNTER — Encounter: Payer: Self-pay | Admitting: Physician Assistant

## 2020-02-20 ENCOUNTER — Telehealth (INDEPENDENT_AMBULATORY_CARE_PROVIDER_SITE_OTHER): Admitting: Physician Assistant

## 2020-02-20 ENCOUNTER — Other Ambulatory Visit: Payer: Self-pay

## 2020-02-20 VITALS — HR 78

## 2020-02-20 DIAGNOSIS — G44209 Tension-type headache, unspecified, not intractable: Secondary | ICD-10-CM | POA: Diagnosis not present

## 2020-02-20 MED ORDER — KETOROLAC TROMETHAMINE 10 MG PO TABS: ORAL_TABLET | ORAL | 0 refills | Status: DC

## 2020-02-20 MED ORDER — TIZANIDINE HCL 4 MG PO TABS: 4.0000 mg | ORAL_TABLET | Freq: Three times a day (TID) | ORAL | 0 refills | Status: DC | PRN

## 2020-02-20 NOTE — Progress Notes (Signed)
I have discussed the procedure for the virtual visit with the patient who has given consent to proceed with assessment and treatment.   Tannar Broker S Atharv Barriere, CMA     

## 2020-02-20 NOTE — Progress Notes (Signed)
Virtual Visit via Video   I connected with patient on 02/20/20 at  2:30 PM EDT by a video enabled telemedicine application and verified that I am speaking with the correct person using two identifiers.  Location patient: Home Location provider: [facility], Office Persons participating in the virtual visit: Patient, Provider, [place] (Patina Moore)  I discussed the limitations of evaluation and management by telemedicine and the availability of in person appointments. The patient expressed understanding and agreed to proceed.  Subjective:   HPI:   Patient presents via Caregility c/o  1 day of headache of L temporal region associated with tension sound the head and neck. Endorses some pain developing in R temporal region as well. Denies vision changes, nausea or vomiting. Denies photophobia or phonophobia. Denies any trauma or injury. Denies heavy lifting. Denies fever, chills, malaise or fatigue.   ROS:   See pertinent positives and negatives per HPI.  Patient Active Problem List   Diagnosis Date Noted  . Hypertension 11/13/2019  . Small intestinal bacterial overgrowth 07/09/2019  . Cervicalgia 10/02/2018  . Primary insomnia 07/19/2017  . SVT (supraventricular tachycardia) ([place]) 04/01/2017  . Generalized anxiety disorder 04/01/2017  . Depression, major, single episode, mild ([place]) 04/01/2017    Social History   Tobacco Use  . Smoking status: Never Smoker  . Smokeless tobacco: Never Used  Substance Use Topics  . Alcohol use: Yes    Comment: OCC    Current Outpatient Medications:  .  ARIPiprazole (ABILIFY) 10 MG tablet, Take 10 mg by mouth daily., Disp: , Rfl:  .  azelastine (ASTELIN) 0.1 % nasal spray, Place 1 spray into both nostrils 2 (two) times daily. Use in each nostril as directed, Disp: 30 mL, Rfl: 12 .  Cholecalciferol (VITAMIN D3) 5000 units CAPS, Take 1 capsule by mouth daily., Disp: , Rfl:  .  desvenlafaxine (PRISTIQ) 50 MG 24 hr tablet, Take 50 mg by  mouth daily., Disp: , Rfl:  .  diazepam (VALIUM) 2 MG tablet, Take 1 tablet (2 mg total) by mouth every 12 (twelve) hours as needed for anxiety., Disp: 10 tablet, Rfl: 0 .  fluticasone (FLONASE) 50 MCG/ACT nasal spray, Place into both nostrils daily., Disp: , Rfl:  .  lamoTRIgine (LAMICTAL) 25 MG tablet, Take 25 mg by mouth daily., Disp: , Rfl:  .  levothyroxine (SYNTHROID) 25 MCG tablet, Take 25 mcg by mouth every morning., Disp: , Rfl:  .  nebivolol (BYSTOLIC) 5 MG tablet, Take 1 tablet (5 mg total) by mouth daily., Disp: 90 tablet, Rfl: 3 .  ondansetron (ZOFRAN) 4 MG tablet, Take 1 tablet (4 mg total) by mouth every 8 (eight) hours as needed for nausea or vomiting., Disp: 20 tablet, Rfl: 0 .  temazepam (RESTORIL) 15 MG capsule, Take 1 to 2 capsules by mouth at bedtime as needed for sleep, Disp: 60 capsule, Rfl: 2 .  VIIBRYD 40 MG TABS, TAKE 1 TABLET DAILY, Disp: 90 tablet, Rfl: 1 .  ketorolac (TORADOL) 10 MG tablet, Take 2 tablets by mouth once. Can take an additional tablet in 6 hours. No more than 40 mg/day, Disp: 5 tablet, Rfl: 0 .  tiZANidine (ZANAFLEX) 4 MG tablet, Take 1 tablet (4 mg total) by mouth every 8 (eight) hours as needed for muscle spasms., Disp: 10 tablet, Rfl: 0  Allergies  Allergen Reactions  . Penicillins Nausea And Vomiting  . Sulfa Antibiotics Nausea And Vomiting    Objective:   Pulse 78   Patient is well-developed, well-nourished in  no acute distress.  Resting comfortably at home.  Head is normocephalic, atraumatic.  No labored breathing.  Speech is clear and coherent with logical content.  Patient is alert and oriented at baseline.   Assessment and Plan:   1. Tension headache Start relaxation techniques, heating pad. Rx Toradol 10 mg to take as directed to help abort headache. Rx Tizanidine for nighttime use to help as well. Strict return precautions reviewed with patient.  Piedad Climes, PA-C 02/20/2020

## 2020-02-21 ENCOUNTER — Encounter: Payer: Self-pay | Admitting: Physician Assistant

## 2020-02-21 NOTE — Telephone Encounter (Signed)
Can take a day or so for tension headache to resolve. Can always get her in to office tomorrow (nurse visit) if not resolved for a toradol injection.

## 2020-03-03 ENCOUNTER — Ambulatory Visit

## 2020-03-07 ENCOUNTER — Ambulatory Visit
Admission: RE | Admit: 2020-03-07 | Discharge: 2020-03-07 | Disposition: A | Discharge status: Home / Self Care | Source: Ambulatory Visit | Attending: Family Medicine | Admitting: Family Medicine

## 2020-03-07 ENCOUNTER — Other Ambulatory Visit: Payer: Self-pay

## 2020-03-07 DIAGNOSIS — Z1231 Encounter for screening mammogram for malignant neoplasm of breast: Secondary | ICD-10-CM

## 2020-03-07 MED ORDER — NEBIVOLOL HCL 5 MG PO TABS: 5.0000 mg | ORAL_TABLET | Freq: Every day | ORAL | 0 refills | Status: DC

## 2020-03-09 ENCOUNTER — Encounter: Payer: Self-pay | Admitting: Physician Assistant

## 2020-03-10 ENCOUNTER — Other Ambulatory Visit: Payer: Self-pay | Admitting: Physician Assistant

## 2020-03-10 DIAGNOSIS — R928 Other abnormal and inconclusive findings on diagnostic imaging of breast: Secondary | ICD-10-CM

## 2020-03-10 NOTE — Telephone Encounter (Signed)
Weight loss does help greatly  Sleep position modification-promote lateral sleep, sleeping with the head of the bed elevated about 30 degrees may help  Option will be an oral device -Send you to a dentist to be evaluated -Goal will be to position the lower jaw anteriorly while you are sleeping so that nothing falls back to obstruct your airway   CPAP does work the best, understandable if you have given a good effort and you do not feel you will be able to get used to it

## 2020-03-11 ENCOUNTER — Encounter: Payer: Self-pay | Admitting: Physician Assistant

## 2020-03-11 ENCOUNTER — Other Ambulatory Visit: Payer: Self-pay

## 2020-03-11 ENCOUNTER — Telehealth (INDEPENDENT_AMBULATORY_CARE_PROVIDER_SITE_OTHER): Admitting: Physician Assistant

## 2020-03-11 DIAGNOSIS — R7303 Prediabetes: Secondary | ICD-10-CM

## 2020-03-11 DIAGNOSIS — E785 Hyperlipidemia, unspecified: Secondary | ICD-10-CM | POA: Diagnosis not present

## 2020-03-11 DIAGNOSIS — R5382 Chronic fatigue, unspecified: Secondary | ICD-10-CM

## 2020-03-11 DIAGNOSIS — G4733 Obstructive sleep apnea (adult) (pediatric): Secondary | ICD-10-CM | POA: Diagnosis not present

## 2020-03-11 DIAGNOSIS — E669 Obesity, unspecified: Secondary | ICD-10-CM

## 2020-03-11 NOTE — Progress Notes (Signed)
I have discussed the procedure for the virtual visit with the patient who has given consent to proceed with assessment and treatment.   Ayannah Faddis S Baldwin Racicot, CMA     

## 2020-03-11 NOTE — Progress Notes (Signed)
.    Virtual Visit via Video   I connected with patient on 03/11/20 at 11:30 AM EDT by a video enabled telemedicine application and verified that I am speaking with the correct person using two identifiers.  Location patient: Home Location provider: [facility], Office Persons participating in the virtual visit: Patient, Provider, CMA (Patina Moore)  I discussed the limitations of evaluation and management by telemedicine and the availability of in person appointments. The patient expressed understanding and agreed to proceed.  Subjective:   HPI:   Patient presents via Caregility today to discuss ongoing issue with fatigue.  Patient has noted this over the past several months.  Initially thought was related to potential sleep apnea.  Patient did have a sleep study which confirmed a very mild obstructive sleep apnea.  Was started on CPAP therapy which she had attempted for 6 weeks.  Noted significant issue with CPAP machine.  Adjustments were made in pressure and max tight which made it somewhat easier to use, but states she still had significant difficulty.  Notes she followed up with her pulmonologist who discussed potential elevation of the head of bed or a dental evaluation for nighttime device to help with apneic symptoms.  Weight loss also recommended.  Patient also notes occasional windedness with climbing stairs, does have follow-up with cardiology on 03/19/2020.  Denies any chest pain, lightheadedness or dizziness.  Was recently evaluated by a holistic practitioner to assess for other causes of fatigue.  Full lab work was obtained, with patient forwarding results to her MyChart.  Was told by that practitioner that her thyroid was abnormal and as such was started on levothyroxine 25 mcg.  Endorses taking daily for greater than 6 weeks.  Denied any change in her symptomology.  States she looked at her lab results herself and noted that her thyroid hormone was only 0.02 points off and  other thyroid tests were normal.  As such she has stopped the medication.  Denies any worsening of symptoms since stopping the medication.  Patient notes her mood is doing well overall.  She does get frustrated regarding her fatigue and her weight.  Has been following a weight watchers program for the past week.  Has lost 2 pounds so far.  Has been trying to keep well-hydrated.  Is wanting to know of any recommendations regarding her fatigue.  Patient also notes her cholesterol was elevated and is wondering if she should be started on medication.  ROS:   See pertinent positives and negatives per HPI.  Patient Active Problem List   Diagnosis Date Noted  . Hypertension 11/13/2019  . Small intestinal bacterial overgrowth 07/09/2019  . Cervicalgia 10/02/2018  . Primary insomnia 07/19/2017  . SVT (supraventricular tachycardia) (HCC) 04/01/2017  . Generalized anxiety disorder 04/01/2017  . 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 Smoking status: Never Smoker  . Smokeless tobacco: Never Used  Substance Use Topics  . Alcohol use: Yes    Comment: OCC    Current Outpatient Medications:  .  ARIPiprazole (ABILIFY) 10 MG tablet, Take 10 mg by mouth daily., Disp: , Rfl:  .  azelastine (ASTELIN) 0.1 % nasal spray, Place 1 spray into both nostrils 2 (two) times daily. Use in each nostril as directed, Disp: 30 mL, Rfl: 12 .  Cholecalciferol (VITAMIN D3) 5000 units CAPS, Take 1 capsule by mouth daily., Disp: , Rfl:  .  desvenlafaxine (PRISTIQ) 50 MG 24 hr tablet,  Take 50 mg by mouth daily., Disp: , Rfl:  .  diazepam (VALIUM) 2 MG tablet, Take 1 tablet (2 mg total) by mouth every 12 (twelve) hours as needed for anxiety., Disp: 10 tablet, Rfl: 0 .  fluticasone (FLONASE) 50 MCG/ACT nasal spray, Place into both nostrils daily., Disp: , Rfl:  .  ketorolac (TORADOL) 10 MG tablet, Take 2 tablets by mouth once. Can take an additional tablet in 6 hours. No  more than 40 mg/day, Disp: 5 tablet, Rfl: 0 .  lamoTRIgine (LAMICTAL) 25 MG tablet, Take 25 mg by mouth daily., Disp: , Rfl:  .  levothyroxine (SYNTHROID) 25 MCG tablet, Take 25 mcg by mouth every morning., Disp: , Rfl:  .  nebivolol (BYSTOLIC) 5 MG tablet, Take 1 tablet (5 mg total) by mouth daily. Please keep upcoming appt with Dr. Delton See in May for future refills. Thank you, Disp: 90 tablet, Rfl: 0 .  ondansetron (ZOFRAN) 4 MG tablet, Take 1 tablet (4 mg total) by mouth every 8 (eight) hours as needed for nausea or vomiting., Disp: 20 tablet, Rfl: 0 .  temazepam (RESTORIL) 15 MG capsule, Take 1 to 2 capsules by mouth at bedtime as needed for sleep, Disp: 60 capsule, Rfl: 2 .  tiZANidine (ZANAFLEX) 4 MG tablet, Take 1 tablet (4 mg total) by mouth every 8 (eight) hours as needed for muscle spasms., Disp: 10 tablet, Rfl: 0 .  VIIBRYD 40 MG TABS, TAKE 1 TABLET DAILY, Disp: 90 tablet, Rfl: 1  Allergies  Allergen Reactions  . Penicillins Nausea And Vomiting  . Sulfa Antibiotics Nausea And Vomiting    Objective:   LMP 02/22/2020   Patient is well-developed, well-nourished in no acute distress.  Resting comfortably at home.  Head is normocephalic, atraumatic.  No labored breathing.  Speech is clear and coherent with logical content.  Patient is alert and oriented at baseline.   Assessment and Plan:   1. Chronic fatigue In the setting of numbers 2 through 5 below.  Lab results from holistic provider reviewed in detail.  Hormonal evaluation within normal limits.  Blood count and metabolic panel stable.  She did have very slight decrease in Free T4, low by 0.02.  TSH, free T3, reverse T3 all within normal limits.  No improvement with 6 weeks of levothyroxine 25 mcg daily.  As noted she has stopped this.  We will repeat thyroid panel to ensure everything remains in a normal range.  We will also have her continue to follow with her sleep medicine specialist for treatment of her sleep apnea as this  likely is a large contributor.  Depression continues to be well controlled per patient report.  We will get patient into clinic for repeat labs.  We will have her follow-up with cardiology for further evaluation giving shortness of breath on exertion.  Follow-up already scheduled.  2. OSA (obstructive sleep apnea) Unable to tolerate CPAP therapy.  Encouraged her to follow her specialist instruction.  She is going to get a wedge to help elevate the head of her bed.  Is also reaching out to her dentist to see about nighttime device to help with symptoms.  Reviewed proper diet and exercise to help promote weight loss.  3. Obesity (BMI 30-39.9) Contributing to OSA, hyperlipidemia and prediabetes.  Dietary and exercise recommendations reviewed.  She has made progress thus far with weight watchers.  Encouraged her to continue the same.  We will have her start omega-3's daily and a cinnamon supplement daily.  Can  consider referral to our healthy weight loss clinic.  4. Hyperlipidemia, unspecified hyperlipidemia type We will have records scanned into chart.  She does have moderate elevation in total cholesterol and LDL along with small lipoprotein particle pattern.  Discussed dietary and exercise recommendations.  Also recommend that she start daily omega-3's.  Follow-up with cardiology as scheduled.  5. Pre-diabetes Dietary and exercise regimen reviewed with patient.  Encouraged her to start OTC cinnamon supplement.  Will monitor closely.    Leeanne Rio, [place] 03/11/2020

## 2020-03-13 ENCOUNTER — Inpatient Hospital Stay: Admission: RE | Admit: 2020-03-13 | Source: Ambulatory Visit

## 2020-03-13 ENCOUNTER — Other Ambulatory Visit

## 2020-03-19 ENCOUNTER — Ambulatory Visit (INDEPENDENT_AMBULATORY_CARE_PROVIDER_SITE_OTHER): Admitting: Cardiology

## 2020-03-19 ENCOUNTER — Encounter: Payer: Self-pay | Admitting: Cardiology

## 2020-03-19 ENCOUNTER — Other Ambulatory Visit: Payer: Self-pay

## 2020-03-19 VITALS — BP 110/68 | HR 72 | Ht 65.0 in | Wt 216.2 lb

## 2020-03-19 DIAGNOSIS — E782 Mixed hyperlipidemia: Secondary | ICD-10-CM

## 2020-03-19 DIAGNOSIS — I471 Supraventricular tachycardia: Secondary | ICD-10-CM | POA: Diagnosis not present

## 2020-03-19 NOTE — Progress Notes (Signed)
Cardiology Clinic Visit  Note   Evaluation Performed:  Follow-up visit  Date:  03/19/2020   ID:  Cassandra Leach, DOB 09-Sep-1971, MRN [id number]  Patient Location: Home Provider Location: Home  PCP:  Waldon Merl, PA-C  Cardiologist:  Tobias Alexander, MD  Electrophysiologist:  None   Chief Complaint:  Yearly f/u  History of Present Illness:    Cassandra Leach is a 49 y.o. female with with history of SVT maintained on Bystolic, could not tolerate Toprol, asthma, and overweight 2D echo 2017 [facility] showed normal LVEF 60 to 65% with mild MR. patient last saw Dr. Delton See 11/18/2017 at which time she was doing well and advised to increase her exercise to help with weight loss and being prediabetic.  Patient had recurrent tachycardia on lower dose bystolic and had to go back up to 5 mg. No tachycardia since. She Joined weight watchers and walking 30-40 min/day. Has losts 8 lbs in the past month.   03/19/2020 -patient is coming after year, she complains that she has gained weight however she is very motivated especially after she learned that she now has new insulin resistance.  She has started an exercise and diet program and has lost 4 pounds within 1 week.  She has started to walk and use stationary bike on a regular basis.  She has questions regarding rapid increase in her heart rate after she initiates her exercise.  She denies any lower extremity edema orthopnea proximal nocturnal dyspnea.  She has been having no palpitations no dizziness or falls or syncope.   Past Medical History:  Diagnosis Date  . Anxiety   . Asthma   . Depression   . History of migraine    No issue in 2 years  . SVT (supraventricular tachycardia) (HCC)    Past Surgical History:  Procedure Laterality Date  . RADIOFREQUENCY ABLATION NERVES       Current Meds  Medication Sig  . ARIPiprazole (ABILIFY) 10 MG tablet Take 10 mg by mouth daily.  Marland Kitchen azelastine (ASTELIN) 0.1 % nasal spray Place 1 spray into  both nostrils 2 (two) times daily. Use in each nostril as directed  . Cholecalciferol (VITAMIN D3) 5000 units CAPS Take 1 capsule by mouth daily.  Marland Kitchen desvenlafaxine (PRISTIQ) 50 MG 24 hr tablet Take 50 mg by mouth daily.  . diazepam (VALIUM) 2 MG tablet Take 1 tablet (2 mg total) by mouth every 12 (twelve) hours as needed for anxiety.  . fluticasone (FLONASE) 50 MCG/ACT nasal spray Place into both nostrils daily.  . nebivolol (BYSTOLIC) 5 MG tablet Take 1 tablet (5 mg total) by mouth daily. Please keep upcoming appt with Dr. Delton See in May for future refills. Thank you  . temazepam (RESTORIL) 15 MG capsule Take 1 to 2 capsules by mouth at bedtime as needed for sleep  . tiZANidine (ZANAFLEX) 4 MG tablet Take 1 tablet (4 mg total) by mouth every 8 (eight) hours as needed for muscle spasms.  Marland Kitchen VIIBRYD 40 MG TABS TAKE 1 TABLET DAILY     Allergies:   Penicillins and Sulfa antibiotics   Social History   Tobacco Use  . Smoking status: Never Smoker  . Smokeless tobacco: Never Used  Substance Use Topics  . Alcohol use: Yes    Comment: OCC  . Drug use: No     Family Hx: The patient's family history includes Angina in her paternal grandmother; Hyperlipidemia in her mother; Hypertension in her mother; Suicidality in her brother.  ROS:  Please see the history of present illness.    Review of Systems  Constitution: Positive for weight gain.   All other systems reviewed and are negative.  Prior CV studies:   The following studies were reviewed today: [facility] LVEF 60 to 65% with mild MR.  Labs/Other Tests and Data Reviewed:    EKG:  No ECG reviewed.  Recent Labs: 10/23/2019: ALT 25; BUN 10; Creatinine, Ser 0.73; Hemoglobin 13.7; Platelets 300.0; Potassium 4.1; Sodium 136; TSH 2.28   Recent Lipid Panel Lab Results  Component Value Date/Time   CHOL 181 11/18/2017 08:01 AM   TRIG 107.0 11/18/2017 08:01 AM   HDL 38.70 (L) 11/18/2017 08:01 AM   CHOLHDL  5 11/18/2017 08:01 AM   LDLCALC 121 (H) 11/18/2017 08:01 AM    Wt Readings from Last 3 Encounters:  03/19/20 216 lb 3.2 oz (98.1 kg)  11/13/19 211 lb 3.2 oz (95.8 kg)  11/01/19 209 lb 3.2 oz (94.9 kg)     Objective:    Vital Signs:  BP 110/68   Pulse 72   Ht 5\' 5"  (1.651 m)   Wt 216 lb 3.2 oz (98.1 kg)   LMP 02/22/2020   SpO2 97%   BMI 35.98 kg/m    VITAL SIGNS:  reviewed GEN:  no acute distress RESPIRATORY:  normal respiratory effort, symmetric expansion CARDIOVASCULAR:  no peripheral edema   ASSESSMENT & PLAN:    1. SVT-continue bystolic.she is asymptomatic no recurrent symptoms  2. Hyperlipidemia LDL 121 HDL 38 triglycerides 107 11/2017 recommend diet and exercise to get LDL down to less than 90.  We will recheck once she loses more weight, her overall CHD risk is low, if she has elevated cholesterol after weight loss and exercise program might consider starting a low-dose cholesterol. 3. Obesity -the patient is very motivated currently on a diet and exercise, she is congratulated and encouraged to exercise more.  Her rapid increase in heart rate after initiation of exercise is just a sign of deconditioning, she is advised to monitor and let us know if it does not improve in the next few months.  COVID-19 Education: The signs and symptoms of COVID-19 were discussed with the patient and how to seek care for testing (follow up with PCP or arrange E-visit).  The importance of social distancing was discussed today.  Time:   Today, I have spent  15 minutes with the patient with telehealth technology discussing the above problems.    Medication Adjustments/Labs and Tests Ordered: Current medicines are reviewed at length with the patient today.  Concerns regarding medicines are outlined above.   Tests Ordered: Orders Placed This Encounter  Procedures  . EKG 12-Lead   Medication Changes: No orders of the defined types were placed in this encounter.  Disposition:  Follow up  in 1 year(s) Dr. Meda Coffee  Signed, Ena Dawley, MD  03/19/2020 11:54 AM    [place]

## 2020-03-19 NOTE — Patient Instructions (Signed)

## 2020-03-24 ENCOUNTER — Ambulatory Visit
Admission: RE | Admit: 2020-03-24 | Discharge: 2020-03-24 | Disposition: A | Discharge status: Home / Self Care | Source: Ambulatory Visit | Attending: Physician Assistant | Admitting: Physician Assistant

## 2020-03-24 ENCOUNTER — Other Ambulatory Visit: Payer: Self-pay | Admitting: Physician Assistant

## 2020-03-24 ENCOUNTER — Other Ambulatory Visit: Payer: Self-pay

## 2020-03-24 DIAGNOSIS — R928 Other abnormal and inconclusive findings on diagnostic imaging of breast: Secondary | ICD-10-CM

## 2020-03-24 DIAGNOSIS — N6320 Unspecified lump in the left breast, unspecified quadrant: Secondary | ICD-10-CM

## 2020-04-03 ENCOUNTER — Ambulatory Visit
Admission: RE | Admit: 2020-04-03 | Discharge: 2020-04-03 | Disposition: A | Discharge status: Home / Self Care | Source: Ambulatory Visit | Attending: Physician Assistant | Admitting: Physician Assistant

## 2020-04-03 ENCOUNTER — Other Ambulatory Visit: Payer: Self-pay

## 2020-04-03 DIAGNOSIS — N6320 Unspecified lump in the left breast, unspecified quadrant: Secondary | ICD-10-CM

## 2020-05-05 ENCOUNTER — Other Ambulatory Visit: Payer: Self-pay

## 2020-05-05 ENCOUNTER — Ambulatory Visit (INDEPENDENT_AMBULATORY_CARE_PROVIDER_SITE_OTHER)

## 2020-05-05 ENCOUNTER — Other Ambulatory Visit: Payer: Self-pay | Admitting: Physician Assistant

## 2020-05-05 DIAGNOSIS — R5382 Chronic fatigue, unspecified: Secondary | ICD-10-CM

## 2020-05-05 DIAGNOSIS — E669 Obesity, unspecified: Secondary | ICD-10-CM | POA: Diagnosis not present

## 2020-05-05 DIAGNOSIS — R7303 Prediabetes: Secondary | ICD-10-CM

## 2020-05-05 DIAGNOSIS — E785 Hyperlipidemia, unspecified: Secondary | ICD-10-CM | POA: Diagnosis not present

## 2020-05-05 DIAGNOSIS — F5101 Primary insomnia: Secondary | ICD-10-CM

## 2020-05-05 LAB — CBC WITH DIFFERENTIAL/PLATELET
Basophils Absolute: 0 10*3/uL (ref 0.0–0.1)
Basophils Relative: 0.6 % (ref 0.0–3.0)
Eosinophils Absolute: 0.1 10*3/uL (ref 0.0–0.7)
Eosinophils Relative: 1.7 % (ref 0.0–5.0)
HCT: 40 % (ref 36.0–46.0)
Hemoglobin: 13.3 g/dL (ref 12.0–15.0)
Lymphocytes Relative: 29.6 % (ref 12.0–46.0)
Lymphs Abs: 2.5 10*3/uL (ref 0.7–4.0)
MCHC: 33.3 g/dL (ref 30.0–36.0)
MCV: 88.2 fl (ref 78.0–100.0)
Monocytes Absolute: 0.6 10*3/uL (ref 0.1–1.0)
Monocytes Relative: 7.1 % (ref 3.0–12.0)
Neutro Abs: 5.1 10*3/uL (ref 1.4–7.7)
Neutrophils Relative %: 61 % (ref 43.0–77.0)
Platelets: 258 10*3/uL (ref 150.0–400.0)
RBC: 4.53 Mil/uL (ref 3.87–5.11)
RDW: 14.1 % (ref 11.5–15.5)
WBC: 8.3 10*3/uL (ref 4.0–10.5)

## 2020-05-05 LAB — T4, FREE: Free T4: 0.65 ng/dL (ref 0.60–1.60)

## 2020-05-05 LAB — T3, FREE: T3, Free: 3.3 pg/mL (ref 2.3–4.2)

## 2020-05-05 LAB — HEMOGLOBIN A1C: Hgb A1c MFr Bld: 6 % (ref 4.6–6.5)

## 2020-05-05 LAB — HEPATIC FUNCTION PANEL
ALT: 25 U/L (ref 0–35)
AST: 19 U/L (ref 0–37)
Albumin: 4.1 g/dL (ref 3.5–5.2)
Alkaline Phosphatase: 53 U/L (ref 39–117)
Bilirubin, Direct: 0.1 mg/dL (ref 0.0–0.3)
Total Bilirubin: 0.3 mg/dL (ref 0.2–1.2)
Total Protein: 6.8 g/dL (ref 6.0–8.3)

## 2020-05-05 LAB — TSH: TSH: 2.3 u[IU]/mL (ref 0.35–4.50)

## 2020-05-05 MED ORDER — TEMAZEPAM 15 MG PO CAPS: ORAL_CAPSULE | ORAL | 2 refills | Status: DC

## 2020-05-06 DIAGNOSIS — Z0289 Encounter for other administrative examinations: Secondary | ICD-10-CM

## 2020-05-07 ENCOUNTER — Other Ambulatory Visit: Payer: Self-pay | Admitting: Physician Assistant

## 2020-05-07 MED ORDER — TIZANIDINE HCL 4 MG PO TABS: 4.0000 mg | ORAL_TABLET | Freq: Three times a day (TID) | ORAL | 0 refills | Status: DC | PRN

## 2020-05-12 ENCOUNTER — Telehealth: Payer: Self-pay | Admitting: Cardiology

## 2020-05-12 NOTE — Telephone Encounter (Signed)
     Pt said she's been feeling her Heart is Pumping harder. She said her HR is normal, denied any symptoms but she felt that there's something wrong.

## 2020-05-12 NOTE — Telephone Encounter (Signed)
Pt is calling in to let Dr. Delton See know that over the weekend, she started experiencing feelings of what she states is her "heart pumping harder." She states her HR has been normal running in the 70s, currently 76, and her BP's remain low-normal for her, around 105 systolic 65 diastolic.  She complains of NO chest pain, chest pressure, sob, sob on exertion, N/V, diaphoresis, dizziness, lightheadedness, pre-syncopal or syncopal episodes.  She states she has no lower extremity edema. She states she is overweight, and working hard on this, and has gained some extra weight from poor nutrition choices, in the last month or so. Pt states she just saw her PCP on 6/21 and labs were done (visible in Epic), and all came back WNLs, except for she is pre-diabetic.  Pt states her PCP referred her to [facility] for prediabetes,  and she will be seeing them tomorrow for her first visit.   Pt states she is walking 20-30 mins daily and is experiencing no cardiac complaints while doing this.  When she walks she has no sob on exertion or exertional chest pain. Pt states she is taking all her medications as prescribed. She takes Bystolic 5 mg po daily. Pt just saw Dr. Delton See in clinic back in May. Pt states she is aware she is very deconditioned.  Pt is unsure what she should do about her symptoms of feeling her "heart pumping harder." She is calling for further advisement from Dr. Delton See on this.  Informed the pt that I will route this message to Dr. Delton See to further review and advise on, and follow-up with her thereafter.  Advised the pt that in the meantime, if she starts having any acute cardiac complaints, she should seek immediate medical attention or go to the ER.  Pt education provided on what warrants immediate medical attention.  Pt verbalized understanding and agrees with this plan.

## 2020-05-12 NOTE — Telephone Encounter (Signed)
Please tell her to hydrate well, if she doesn't have associated symptoms of dizziness, pre syncope she should be ok. Are her palpitations fast or just strong? If she continues to have those we should place a monitor.  Tobias Alexander

## 2020-05-13 ENCOUNTER — Ambulatory Visit (INDEPENDENT_AMBULATORY_CARE_PROVIDER_SITE_OTHER): Admitting: Family Medicine

## 2020-05-13 ENCOUNTER — Encounter (INDEPENDENT_AMBULATORY_CARE_PROVIDER_SITE_OTHER): Payer: Self-pay | Admitting: Family Medicine

## 2020-05-13 ENCOUNTER — Other Ambulatory Visit: Payer: Self-pay

## 2020-05-13 VITALS — BP 118/75 | HR 82 | Temp 98.3°F | Ht 65.0 in | Wt 221.0 lb

## 2020-05-13 DIAGNOSIS — N951 Menopausal and female climacteric states: Secondary | ICD-10-CM

## 2020-05-13 DIAGNOSIS — G4733 Obstructive sleep apnea (adult) (pediatric): Secondary | ICD-10-CM | POA: Diagnosis not present

## 2020-05-13 DIAGNOSIS — F32A Depression, unspecified: Secondary | ICD-10-CM

## 2020-05-13 DIAGNOSIS — Z6836 Body mass index (BMI) 36.0-36.9, adult: Secondary | ICD-10-CM

## 2020-05-13 DIAGNOSIS — E559 Vitamin D deficiency, unspecified: Secondary | ICD-10-CM

## 2020-05-13 DIAGNOSIS — R0602 Shortness of breath: Secondary | ICD-10-CM

## 2020-05-13 DIAGNOSIS — R7303 Prediabetes: Secondary | ICD-10-CM | POA: Diagnosis not present

## 2020-05-13 DIAGNOSIS — F419 Anxiety disorder, unspecified: Secondary | ICD-10-CM

## 2020-05-13 DIAGNOSIS — R5383 Other fatigue: Secondary | ICD-10-CM | POA: Diagnosis not present

## 2020-05-13 DIAGNOSIS — I471 Supraventricular tachycardia: Secondary | ICD-10-CM

## 2020-05-13 DIAGNOSIS — Z9189 Other specified personal risk factors, not elsewhere classified: Secondary | ICD-10-CM | POA: Diagnosis not present

## 2020-05-13 DIAGNOSIS — F329 Major depressive disorder, single episode, unspecified: Secondary | ICD-10-CM | POA: Diagnosis not present

## 2020-05-13 MED ORDER — NEBIVOLOL HCL 5 MG PO TABS: 5.0000 mg | ORAL_TABLET | Freq: Every day | ORAL | 3 refills | Status: DC

## 2020-05-13 MED ORDER — SAXENDA 18 MG/3ML ~~LOC~~ SOPN: 3.0000 mg | PEN_INJECTOR | Freq: Every day | SUBCUTANEOUS | 0 refills | Status: DC

## 2020-05-13 MED ORDER — BD PEN NEEDLE NANO 2ND GEN 32G X 4 MM MISC: 1.0000 | Freq: Every day | 0 refills | Status: DC

## 2020-05-13 NOTE — Progress Notes (Signed)
Dear Marcelline MatesWilliam Martin, PA-C,   Thank you for referring Cassandra Leach to our clinic. The following note includes my evaluation and treatment recommendations.  Chief Complaint:   OBESITY Cassandra Leach (MR# [id number]) is a 49 y.o. female who presents for evaluation and treatment of obesity and related comorbidities. Current BMI is Body mass index is 36.78 kg/m. Cassandra Leach has been struggling with her weight for many years and has been unsuccessful in either losing weight, maintaining weight loss, or reaching her healthy weight goal.  Cassandra Leach is currently in the action stage of change and ready to dedicate time achieving and maintaining a healthier weight. Cassandra Leach is interested in becoming our patient and working on intensive lifestyle modifications including (but not limited to) diet and exercise for weight loss.  Cassandra Leach was referred by Malva Coganody Martin, PA.  She says her weight has increased over the past 5 years.  She was previously a runner, 25 miles per week.  No injuries.  She says that keto helped, but she did not like the high fat content.    Cassandra Leach's habits were reviewed today and are as follows: Her family eats meals together, she thinks her family will eat healthier with her, her desired weight loss is 82 pounds, she started gaining weight 5 years ago, her heaviest weight ever was her current weight, she craves sugar, carbs, bread, cake, pizza, and beer, she snacks frequently in the evenings, she wakes up frequently in the middle of the night to eat, she has problems with excessive hunger and she struggles with emotional eating.  Depression Screen Cassandra Leach's Food and Mood (modified PHQ-9) score was 17.  [facility]  Decreased Interest 3  Down, Depressed, Hopeless -  PHQ - 2 Score 3  Altered sleeping 3  Tired, decreased energy 3  Change in appetite 3  Feeling bad or failure about yourself  3  Trouble concentrating 0  Moving slowly or fidgety/restless 0  Suicidal thoughts 2    PHQ-9 Score 17  Difficult doing work/chores Somewhat difficult  Some recent data might be hidden   Subjective:   1. Other fatigue Cassandra Leach denies daytime somnolence and admits to waking up still tired. Patent has a history of symptoms of morning fatigue and snoring. Cassandra Leach generally gets 10 hours of sleep per night, and states that she has poor quality sleep. Snoring is present. Apneic episodes are present. Epworth Sleepiness Score is 4.  2. SOB (shortness of breath) on exertion Cassandra Leach notes increasing shortness of breath with exercising and seems to be worsening over time with weight gain. She notes getting out of breath sooner with activity than she used to. This has gotten worse recently. Cassandra Leach denies shortness of breath at rest or orthopnea.  3. OSA (obstructive sleep apnea) Cassandra Leach has a diagnosis of mild sleep apnea. She reports that she is not using a CPAP.  She says she was unable to adjust to it.   4. Prediabetes Cassandra Leach has a diagnosis of prediabetes based on her elevated HgA1c and was informed this puts her at greater risk of developing diabetes. She continues to work on diet and exercise to decrease her risk of diabetes. She denies nausea or hypoglycemia.  Lab Results  Component Value Date   HGBA1C 6.0 05/05/2020   5. SVT (supraventricular tachycardia) (HCC) Cassandra Leach has the diagnosis of SVT and takes Bystolic.  6. Vitamin D deficiency Cassandra Leach Vitamin D level was 31.02 on 12/8/020. She is currently taking OTC vitamin D 5000 IU each  day. She denies nausea, vomiting or muscle weakness.  7. Perimenopausal Cassandra Leach endorses vasomotor hot flashes.  8. Anxiety and depression, with emotional eating Associated with insomnia.  Cassandra Leach is struggling with emotional eating and using food for comfort to the extent that it is negatively impacting her health. She has been working on behavior modification techniques to help reduce her emotional eating and has been unsuccessful. She shows no sign of  homicidal ideations.  She is taking Abilify, Viibryd, Pristiq, and Restoril.  SI:  Passive, no active plans.  She sees Herbert Deaner at [facility].    Assessment/Plan:   1. Other fatigue Cassandra Leach does feel that her weight is causing her energy to be lower than it should be. Fatigue may be related to obesity, depression or many other causes. Labs will be ordered, and in the meanwhile, Cassandra Leach will focus on self care including making healthy food choices, increasing physical activity and focusing on stress reduction.  Orders - EKG 12-Lead  2. SOB (shortness of breath) on exertion Cassandra Leach notes increasing shortness of breath with exercising and seems to be worsening over time with weight gain. She notes getting out of breath sooner with activity than she used to. This has gotten worse recently. Cassandra Leach denies shortness of breath at rest or orthopnea.  3. OSA (obstructive sleep apnea) Intensive lifestyle modifications are the first line treatment for this issue. We discussed several lifestyle modifications today and she will continue to work on diet, exercise and weight loss efforts. We will continue to monitor. Orders and follow up as documented in patient record.   Counseling  Sleep apnea is a condition in which breathing pauses or becomes shallow during sleep. This happens over and over during the night. This disrupts your sleep and keeps your body from getting the rest that it needs, which can cause tiredness and lack of energy (fatigue) during the day.  Sleep apnea treatment: If you were given a device to open your airway while you sleep, USE IT!  Sleep hygiene:   Limit or avoid alcohol, caffeinated beverages, and cigarettes, especially close to bedtime.   Do not eat a large meal or eat spicy foods right before bedtime. This can lead to digestive discomfort that can make it hard for you to sleep.  Keep a sleep diary to help you and your health care provider figure out what could be causing  your insomnia.  . Make your bedroom a dark, comfortable place where it is easy to fall asleep. ? Put up shades or blackout curtains to block light from outside. ? Use a white noise machine to block noise. ? Keep the temperature cool. . Limit screen use before bedtime. This includes: ? Watching TV. ? Using your smartphone, tablet, or computer. . Stick to a routine that includes going to bed and waking up at the same times every day and night. This can help you fall asleep faster. Consider making a quiet activity, such as reading, part of your nighttime routine. . Try to avoid taking naps during the day so that you sleep better at night. . Get out of bed if you are still awake after 15 minutes of trying to sleep. Keep the lights down, but try reading or doing a quiet activity. When you feel sleepy, go back to bed.  4. Prediabetes Cassandra Leach will continue to work on weight loss, exercise, and decreasing simple carbohydrates to help decrease the risk of diabetes.   5. SVT (supraventricular tachycardia) (HCC) Will continue to monitor.  Orders - Lipid Panel With LDL/HDL Ratio  6. Vitamin D deficiency Low Vitamin D level contributes to fatigue and are associated with obesity, breast, and colon cancer. She agrees to continue to take OTC Vitamin D @5 ,000 IU daily and will follow-up for routine testing of Vitamin D, at least 2-3 times per year to avoid over-replacement.  Orders - VITAMIN D 25 Hydroxy (Vit-D Deficiency, Fractures)  7. Perimenopausal Current treatment plan is effective, no change in therapy.  8. Anxiety and depression, with emotional eating Patient was referred to Dr. , our Bariatric Psychologist, for evaluation due to her elevated PHQ-9 score and significant struggles with emotional eating.  9. At risk for heart disease Cassandra Leach was given approximately 15 minutes of coronary artery disease prevention counseling today. She is 49 y.o. female and has risk factors for heart disease  including obesity. We discussed intensive lifestyle modifications today with an emphasis on specific weight loss instructions and strategies.   Repetitive spaced learning was employed today to elicit superior memory formation and behavioral change.  10. Class 2 severe obesity with serious comorbidity and body mass index (BMI) of 36.0 to 36.9 in adult, unspecified obesity type (HCC)  Orders - Liraglutide -Weight Management (SAXENDA) 18 MG/3ML SOPN; Inject 0.5 mLs (3 mg total) into the skin daily.  Dispense: 5 pen; Refill: 0  Start with 0.6 mg subcu daily and increase weekly to next dose if tolerated. - Insulin Pen Needle (BD PEN NEEDLE NANO 2ND GEN) 32G X 4 MM MISC; 1 each by Does not apply route daily.  Dispense: 100 each; Refill: 0  Cassandra Leach is currently in the action stage of change and her goal is to continue with weight loss efforts. I recommend Cassandra Leach begin the structured treatment plan as follows:  She has agreed to the Category 3 Plan.  Exercise goals: No exercise has been prescribed at this time.   Behavioral modification strategies: increasing lean protein intake, decreasing simple carbohydrates, increasing vegetables, increasing water intake and decreasing liquid calories.  She was informed of the importance of frequent follow-up visits to maximize her success with intensive lifestyle modifications for her multiple health conditions. She was informed we would discuss her lab results at her next visit unless there is a critical issue that needs to be addressed sooner. Cassandra Leach agreed to keep her next visit at the agreed upon time to discuss these results.  Objective:   Blood pressure 118/75, pulse 82, temperature 98.3 F (36.8 C), temperature source Oral, height 5\' 5"  (1.651 m), weight 221 lb (100.2 kg), last menstrual period 05/08/2020, SpO2 97 %. Body mass index is 36.78 kg/m.  EKG: Normal sinus rhythm, rate 90 bpm.  Indirect Calorimeter completed today shows a VO2 of 282 and a REE of  1962.  Her calculated basal metabolic rate is thus her basal metabolic rate is better than expected.  General: Cooperative, alert, well developed, in no acute distress. HEENT: Conjunctivae and lids unremarkable. Cardiovascular: Regular rhythm.  Lungs: Normal work of breathing. Neurologic: No focal deficits.   Lab Results  Component Value Date   CREATININE 0.73 10/23/2019   BUN 10 10/23/2019   NA 136 10/23/2019   K 4.1 10/23/2019   CL 103 10/23/2019   CO2 28 10/23/2019   Lab Results  Component Value Date   ALT 25 05/05/2020   AST 19 05/05/2020   ALKPHOS 53 05/05/2020   BILITOT 0.3 05/05/2020   Lab Results  Component Value Date   HGBA1C 6.0 05/05/2020   HGBA1C 5.8  10/23/2019   Lab Results  Component Value Date   TSH 2.30 05/05/2020   Lab Results  Component Value Date   CHOL 181 11/18/2017   HDL 38.70 (L) 11/18/2017   LDLCALC 121 (H) 11/18/2017   TRIG 107.0 11/18/2017   CHOLHDL 5 11/18/2017   Lab Results  Component Value Date   WBC 8.3 05/05/2020   HGB 13.3 05/05/2020   HCT 40.0 05/05/2020   MCV 88.2 05/05/2020   PLT 258.0 05/05/2020   Lab Results  Component Value Date   IRON 67 08/16/2017   TIBC 289 08/16/2017   FERRITIN 34 08/16/2017   Attestation Statements:   This is the patient's first visit at [facility]. The patient's NEW PATIENT PACKET was reviewed at length. Included in the packet: current and past health history, medications, allergies, ROS, gynecologic history (women only), surgical history, family history, social history, weight history, weight loss surgery history (for those that have had weight loss surgery), nutritional evaluation, mood and food questionnaire, PHQ9, Epworth questionnaire, sleep habits questionnaire, patient life and health improvement goals questionnaire. These will all be scanned into the patient's chart under media.   During the visit, I independently reviewed the patient's EKG, bioimpedance scale results,  and indirect calorimeter results. I used this information to tailor a meal plan for the patient that will help her to lose weight and will improve her obesity-related conditions going forward. I performed a medically necessary appropriate examination and/or evaluation. I discussed the assessment and treatment plan with the patient. The patient was provided an opportunity to ask questions and all were answered. The patient agreed with the plan and demonstrated an understanding of the instructions. Labs were ordered at this visit and will be reviewed at the next visit unless more critical results need to be addressed immediately. Clinical information was updated and documented in the EMR.   I, Insurance claims handler, CMA, am acting as transcriptionist for Helane Rima, DO  I have reviewed the above documentation for accuracy and completeness, and I agree with the above. Helane Rima, DO

## 2020-05-13 NOTE — Telephone Encounter (Signed)
Spoke with the pt and informed her of Dr Delton See recommendations for her to increase her hydration, ideally drink 64 oz of water a day.  She continues to have NO associated symptoms like dizziness or pre-syncope.  She describes the feeling as her heart working harder, but its infrequent and doesn't last long. She has no palpitations, fluttering, or feeling any skipped beats. Advised her that if her symptoms persist, then she should call us back and we can order for her to have a monitor placed.  Pt verbalized understanding and agrees with this plan. Will send this message back to Dr. Delton See as an Lorain Childes.
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
## 2020-05-14 LAB — ANEMIA PANEL
Ferritin: 71 ng/mL (ref 15–150)
Folate, Hemolysate: 314 ng/mL
Folate, RBC: 534 ng/mL (ref >498)
Hematocrit: 58.8 % — ABNORMAL HIGH (ref 34.0–46.6)
Iron Saturation: 30 % (ref 15–55)
Iron: 113 ug/dL (ref 27–159)
Retic Ct Pct: 2.3 % (ref 0.6–2.6)
Total Iron Binding Capacity: 372 ug/dL (ref 250–450)
UIBC: 259 ug/dL (ref 131–425)
Vitamin B-12: 813 pg/mL (ref 232–1245)

## 2020-05-14 LAB — LIPID PANEL WITH LDL/HDL RATIO
Cholesterol, Total: 238 mg/dL — ABNORMAL HIGH (ref 100–199)
HDL: 34 mg/dL — ABNORMAL LOW (ref >39)
LDL Chol Calc (NIH): 154 mg/dL — ABNORMAL HIGH (ref 0–99)
LDL/HDL Ratio: 4.5 ratio — ABNORMAL HIGH (ref 0.0–3.2)
Triglycerides: 270 mg/dL — ABNORMAL HIGH (ref 0–149)
VLDL Cholesterol Cal: 50 mg/dL — ABNORMAL HIGH (ref 5–40)

## 2020-05-14 LAB — VITAMIN D 25 HYDROXY (VIT D DEFICIENCY, FRACTURES): Vit D, 25-Hydroxy: 33.5 ng/mL (ref 30.0–100.0)

## 2020-05-15 NOTE — Telephone Encounter (Signed)
Please advise 

## 2020-05-15 NOTE — Telephone Encounter (Signed)
Please advise PA for Saxenda denied

## 2020-05-22 ENCOUNTER — Telehealth (INDEPENDENT_AMBULATORY_CARE_PROVIDER_SITE_OTHER): Admitting: Psychology

## 2020-05-27 ENCOUNTER — Ambulatory Visit (INDEPENDENT_AMBULATORY_CARE_PROVIDER_SITE_OTHER): Admitting: Family Medicine

## 2020-05-28 ENCOUNTER — Ambulatory Visit (INDEPENDENT_AMBULATORY_CARE_PROVIDER_SITE_OTHER): Admitting: Family Medicine

## 2020-07-09 ENCOUNTER — Encounter (INDEPENDENT_AMBULATORY_CARE_PROVIDER_SITE_OTHER): Payer: Self-pay | Admitting: Family Medicine

## 2020-07-16 ENCOUNTER — Encounter: Payer: Self-pay | Admitting: Physician Assistant

## 2020-07-16 ENCOUNTER — Telehealth (INDEPENDENT_AMBULATORY_CARE_PROVIDER_SITE_OTHER): Admitting: Physician Assistant

## 2020-07-16 ENCOUNTER — Other Ambulatory Visit: Payer: Self-pay

## 2020-07-16 DIAGNOSIS — J029 Acute pharyngitis, unspecified: Secondary | ICD-10-CM | POA: Diagnosis not present

## 2020-07-16 MED ORDER — PREDNISONE 10 MG PO TABS
30.0000 mg | ORAL_TABLET | Freq: Every day | ORAL | 0 refills | Status: DC
Start: 2020-07-16 — End: 2020-09-03

## 2020-07-16 MED ORDER — PREDNISONE 10 MG PO TABS
30.0000 mg | ORAL_TABLET | Freq: Every day | ORAL | 0 refills | Status: DC
Start: 2020-07-16 — End: 2020-07-16

## 2020-07-16 NOTE — Patient Instructions (Signed)
Instructions sent to MyChart

## 2020-07-16 NOTE — Progress Notes (Signed)
Virtual Visit via Video   I connected with patient on 07/16/20 at  9:00 AM EDT by a video enabled telemedicine application and verified that I am speaking with the correct person using two identifiers.  Location patient: Home Location provider: [place] [facility], Office Persons participating in the virtual visit: Patient, Provider, CMA (Patina Moore)  I discussed the limitations of evaluation and management by telemedicine and the availability of in person appointments. The patient expressed understanding and agreed to proceed.  Subjective:   HPI:   Patient presents via [facility] today c/o sore throat noted on waking this morning. Notes pain is bilateral. Denies fever, chills. Denies any increase in nasal congestion. Denies difficulty swallowing but notes odynophagia. Denies recent travel or sick contact. Noted a little bit of blood in her nasal drainage this morning. Has PND each morning if not taking her allergy medications. None since. Phlegm is clear. Has taken tylenol this morning. Has been keeping well-hydrated, sticking with warm liquids this morning to help soothe throat.   ROS:   See pertinent positives and negatives per HPI.  Patient Active Problem List   Diagnosis Date Noted  . Hypertension 11/13/2019  . Small intestinal bacterial overgrowth 07/09/2019  . Cervicalgia 10/02/2018  . Primary insomnia 07/19/2017  . SVT (supraventricular tachycardia) (HCC) 04/01/2017  . Generalized anxiety disorder 04/01/2017  . 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 Smoking status: Never Smoker  . Smokeless tobacco: Never Used  Substance Use Topics  . Alcohol use: Yes    Comment: OCC    Current Outpatient Medications:  .  ARIPiprazole (ABILIFY) 10 MG tablet, Take 10 mg by mouth daily., Disp: , Rfl:  .  Cholecalciferol (VITAMIN D3) 5000 units CAPS, Take 1 capsule by mouth daily., Disp: , Rfl:  .  desvenlafaxine (PRISTIQ) 50  MG 24 hr tablet, Take 50 mg by mouth daily., Disp: , Rfl:  .  Insulin Pen Needle (BD PEN NEEDLE NANO 2ND GEN) 32G X 4 MM MISC, 1 each by Does not apply route daily., Disp: 100 each, Rfl: 0 .  Liraglutide -Weight Management (SAXENDA) 18 MG/3ML SOPN, Inject 0.5 mLs (3 mg total) into the skin daily., Disp: 5 pen, Rfl: 0 .  nebivolol (BYSTOLIC) 5 MG tablet, Take 1 tablet (5 mg total) by mouth daily., Disp: 90 tablet, Rfl: 3 .  temazepam (RESTORIL) 15 MG capsule, Take 1 to 2 capsules by mouth at bedtime as needed for sleep, Disp: 60 capsule, Rfl: 2 .  tiZANidine (ZANAFLEX) 4 MG tablet, Take 1 tablet (4 mg total) by mouth every 8 (eight) hours as needed for muscle spasms., Disp: 10 tablet, Rfl: 0 .  VIIBRYD 40 MG TABS, TAKE 1 TABLET DAILY, Disp: 90 tablet, Rfl: 1  Allergies  Allergen Reactions  . Penicillins Nausea And Vomiting  . Sulfa Antibiotics Nausea And Vomiting    Objective:   There were no vitals taken for this visit.  Patient is well-developed, well-nourished in no acute distress.  Resting comfortably at home.  Head is normocephalic, atraumatic.  No labored breathing.  Speech is clear and coherent with logical content.  Patient is alert and oriented at baseline.   Assessment and Plan:   1. Sore throat Most likely secondary to a.m. postnasal drip from uncontrolled allergies.  Restart allergy medications.  Start saline nasal rinse.  Salt water gargles recommended.  Can alternate Tylenol or ibuprofen if needed.  Very hard to visualize throat on examination  today.  She did submit MyChart pictures where there is some mild uvular swelling noted without ulceration.  No evidence of tonsillar enlargement or exudate.  Uvula is midline.  We will add on 3-day course of prednisone 30 mg to help with swelling.  Strict follow-up discussed with patient who voiced understanding and agreement with the plan.    Piedad Climes, PA-C 07/16/2020

## 2020-07-18 MED ORDER — CEFDINIR 300 MG PO CAPS
300.0000 mg | ORAL_CAPSULE | Freq: Two times a day (BID) | ORAL | 0 refills | Status: DC
Start: 2020-07-18 — End: 2020-09-03

## 2020-08-01 ENCOUNTER — Encounter: Payer: Self-pay | Admitting: Physician Assistant

## 2020-08-01 ENCOUNTER — Other Ambulatory Visit: Payer: Self-pay | Admitting: Emergency Medicine

## 2020-08-01 DIAGNOSIS — F5101 Primary insomnia: Secondary | ICD-10-CM

## 2020-08-01 MED ORDER — TEMAZEPAM 15 MG PO CAPS: ORAL_CAPSULE | ORAL | 2 refills | Status: DC

## 2020-08-01 NOTE — Telephone Encounter (Signed)
Temazepam last rx 05/05/20 #60 2 RF LOV: 07/16/20 Sore throat

## 2020-08-04 ENCOUNTER — Other Ambulatory Visit: Payer: Self-pay

## 2020-08-04 NOTE — Telephone Encounter (Signed)
Received a refill request for tizanidine. LFD 05/07/20 #10 with no refills LOV 07/16/20 NOV none

## 2020-08-05 MED ORDER — TIZANIDINE HCL 4 MG PO TABS: 4.0000 mg | ORAL_TABLET | Freq: Three times a day (TID) | ORAL | 0 refills | Status: DC | PRN

## 2020-09-03 ENCOUNTER — Ambulatory Visit (INDEPENDENT_AMBULATORY_CARE_PROVIDER_SITE_OTHER): Admitting: Nurse Practitioner

## 2020-09-03 ENCOUNTER — Encounter: Payer: Self-pay | Admitting: Nurse Practitioner

## 2020-09-03 ENCOUNTER — Other Ambulatory Visit: Payer: Self-pay

## 2020-09-03 VITALS — BP 128/80

## 2020-09-03 DIAGNOSIS — Z23 Encounter for immunization: Secondary | ICD-10-CM | POA: Diagnosis not present

## 2020-09-03 DIAGNOSIS — R61 Generalized hyperhidrosis: Secondary | ICD-10-CM | POA: Diagnosis not present

## 2020-09-03 MED ORDER — NORETHIN ACE-ETH ESTRAD-FE 1-20 MG-MCG PO TABS: 1.0000 | ORAL_TABLET | Freq: Every day | ORAL | 3 refills | Status: DC

## 2020-09-03 NOTE — Progress Notes (Signed)
   Acute Office Visit  Subjective:    Patient ID: Cassandra Leach, female    DOB: May 27, 1971, 49 y.o.   MRN: [id number]   HPI 49 y.o. presents today for increased sweating.  This is not new for her and started 2 to 3 years ago but has progressively gotten worse.  She has been evaluated by her PCP, GYN, and functional medicine doctor with negative work-up.  She reports excessive sweating to the point her hair is soaked and she has to change clothes throughout the day. Usually the sweating is with activity but sometimes it is minimal activity. She has had a significant weight gain that was at one point 100 pounds. She is down 10 pounds with exercise and diet. She is wondering if she is perimenopausal. She has regular monthly cycles.  She sees psychiatry for anxiety/depression and insomnia and is on multiple medications for this.  Hypertension is listed in her history but this is not a true diagnosis.  She does have SVT and takes Bystolic for this.   Review of Systems  Constitutional: Positive for diaphoresis and unexpected weight change. Negative for activity change and appetite change.  Genitourinary: Negative.        Objective:    Physical Exam Constitutional:      Appearance: Normal appearance. She is obese.     BP 128/80 (BP Location: Right Arm, Patient Position: Sitting, Cuff Size: Normal)   LMP 08/29/2020  Wt Readings from Last 3 Encounters:  05/13/20 221 lb (100.2 kg)  03/19/20 216 lb 3.2 oz (98.1 kg)  11/13/19 211 lb 3.2 oz (95.8 kg)        Assessment & Plan:   Problem List Items Addressed This Visit    None    Visit Diagnoses    Hyperhidrosis    -  Primary   Diaphoresis       Relevant Medications   norethindrone-ethinyl estradiol (LOESTRIN FE) 1-20 MG-MCG tablet   Need for immunization against influenza       Relevant Orders   Flu Vaccine QUAD 36+ mos IM (Completed)     Plan: We discussed her previously negative work-up.  We also discussed the possibility of  significant weight gain being cause for her increased sweating.  She is having regular monthly cycles so we discussed perimenopause/menopause and that she does not show any signs of these.  She would like to try hormones to see if this helps so we will tried Loestrin Fe 1-20.  She has taken oral contraceptives in the past and did well on them.  She will trial these for 2 to 3 months and if she notices improvement she will continue otherwise she will stop.  She is aware of the slight risk for blood clots.  Her husband had a vasectomy so this is not for contraception.  She is on antidepressant and antipsychotic medications so we will not trial Effexor.  She is agreeable to plan.     Olivia Mackie [facility], 10:13 AM 09/03/2020

## 2020-09-03 NOTE — Patient Instructions (Addendum)
Oral Contraception Use Oral contraceptive pills (OCPs) are medicines that you take to prevent pregnancy. OCPs work by:  Preventing the ovaries from releasing eggs.  Thickening mucus in the lower part of the uterus (cervix), which prevents sperm from entering the uterus.  Thinning the lining of the uterus (endometrium), which prevents a fertilized egg from attaching to the endometrium. OCPs are highly effective when taken exactly as prescribed. However, OCPs do not prevent sexually transmitted infections (STIs). Safe sex practices, such as using condoms while on an OCP, can help prevent STIs. Before taking OCPs, you may have a physical exam, blood test, and Pap test. A Pap test involves taking a sample of cells from your cervix to check for cancer. Discuss with your health care provider the possible side effects of the OCP you may be prescribed. When you start an OCP, be aware that it can take 2-3 months for your body to adjust to changes in hormone levels. How to take oral contraceptive pills Follow instructions from your health care provider about how to start taking your first cycle of OCPs. Your health care provider may recommend that you:  Start the pill on day 1 of your menstrual period. If you start at this time, you will not need any backup form of birth control (contraception), such as condoms.  Start the pill on the first Sunday after your menstrual period or on the day you get your prescription. In these cases, you will need to use backup contraception for the first week.  Start the pill at any time of your cycle. ? If you take the pill within 5 days of the start of your period, you will not need a backup form of contraception. ? If you start at any other time of your menstrual cycle, you will need to use another form of contraception for 7 days. If your OCP is the type called a minipill, it will protect you from pregnancy after taking it for 2 days (48 hours), and you can stop using  backup contraception after that time. After you have started taking OCPs:  If you forget to take 1 pill, take it as soon as you remember. Take the next pill at the regular time.  If you miss 2 or more pills, call your health care provider. Different pills have different instructions for missed doses. Use backup birth control until your next menstrual period starts.  If you use a 28-day pack that contains inactive pills and you miss 1 of the last 7 pills (pills with no hormones), throw away the rest of the non-hormone pills and start a new pill pack. No matter which day you start the OCP, you will always start a new pack on that same day of the week. Have an extra pack of OCPs and a backup contraceptive method available in case you miss some pills or lose your OCP pack. Follow these instructions at home:  Do not use any products that contain nicotine or tobacco, such as cigarettes and e-cigarettes. If you need help quitting, ask your health care provider.  Always use a condom to protect against STIs. OCPs do not protect against STIs.  Use a calendar to mark the days of your menstrual period.  Read the information and directions that came with your OCP. Talk to your health care provider if you have questions. Contact a health care provider if:  You develop nausea and vomiting.  You have abnormal vaginal discharge or bleeding.  You develop a rash.    You miss your menstrual period. Depending on the type of OCP you are taking, this may be a sign of pregnancy. Ask your health care provider for more information.  You are losing your hair.  You need treatment for mood swings or depression.  You get dizzy when taking the OCP.  You develop acne after taking the OCP.  You become pregnant or think you may be pregnant.  You have diarrhea, constipation, and abdominal pain or cramps.  You miss 2 or more pills. Get help right away if:  You develop chest pain.  You develop shortness of  breath.  You have an uncontrolled or severe headache.  You develop numbness or slurred speech.  You develop visual or speech problems.  You develop pain, redness, and swelling in your legs.  You develop weakness or numbness in your arms or legs. Summary  Oral contraceptive pills (OCPs) are medicines that you take to prevent pregnancy.  OCPs do not prevent sexually transmitted infections (STIs). Always use a condom to protect against STIs.  When you start an OCP, be aware that it can take 2-3 months for your body to adjust to changes in hormone levels.  Read all the information and directions that come with your OCP. This information is not intended to replace advice given to you by your health care provider. Make sure you discuss any questions you have with your health care provider. Document Revised: 02/23/2019 Document Reviewed: 12/13/2016 Elsevier Patient Education  2020 [employer]  Menopause Menopause is the normal time of life when menstrual periods stop completely. It is usually confirmed by 12 months without a menstrual period. The transition to menopause (perimenopause) most often happens between the ages of 65 and 27. During perimenopause, hormone levels change in your body, which can cause symptoms and affect your health. Menopause may increase your risk for:  Loss of bone (osteoporosis), which causes bone breaks (fractures).  Depression.  Hardening and narrowing of the arteries (atherosclerosis), which can cause heart attacks and strokes. What are the causes? This condition is usually caused by a natural change in hormone levels that happens as you get older. The condition may also be caused by surgery to remove both ovaries (bilateral oophorectomy). What increases the risk? This condition is more likely to start at an earlier age if you have certain medical conditions or treatments, including:  A tumor of the pituitary gland in the brain.  A disease that affects  the ovaries and hormone production.  Radiation treatment for cancer.  Certain cancer treatments, such as chemotherapy or hormone (anti-estrogen) therapy.  Heavy smoking and excessive alcohol use.  Family history of early menopause. This condition is also more likely to develop earlier in women who are very thin. What are the signs or symptoms? Symptoms of this condition include:  Hot flashes.  Irregular menstrual periods.  Night sweats.  Changes in feelings about sex. This could be a decrease in sex drive or an increased comfort around your sexuality.  Vaginal dryness and thinning of the vaginal walls. This may cause painful intercourse.  Dryness of the skin and development of wrinkles.  Headaches.  Problems sleeping (insomnia).  Mood swings or irritability.  Memory problems.  Weight gain.  Hair growth on the face and chest.  Bladder infections or problems with urinating. How is this diagnosed? This condition is diagnosed based on your medical history, a physical exam, your age, your menstrual history, and your symptoms. Hormone tests may also be done. How is this  treated? In some cases, no treatment is needed. You and your health care provider should make a decision together about whether treatment is necessary. Treatment will be based on your individual condition and preferences. Treatment for this condition focuses on managing symptoms. Treatment may include:  Menopausal hormone therapy (MHT).  Medicines to treat specific symptoms or complications.  Acupuncture.  Vitamin or herbal supplements. Before starting treatment, make sure to let your health care provider know if you have a personal or family history of:  Heart disease.  Breast cancer.  Blood clots.  Diabetes.  Osteoporosis. Follow these instructions at home: Lifestyle  Do not use any products that contain nicotine or tobacco, such as cigarettes and e-cigarettes. If you need help quitting, ask  your health care provider.  Get at least 30 minutes of physical activity on 5 or more days each week.  Avoid alcoholic and caffeinated beverages, as well as spicy foods. This may help prevent hot flashes.  Get 7-8 hours of sleep each night.  If you have hot flashes, try: ? Dressing in layers. ? Avoiding things that may trigger hot flashes, such as spicy food, warm places, or stress. ? Taking slow, deep breaths when a hot flash starts. ? Keeping a fan in your home and office.  Find ways to manage stress, such as deep breathing, meditation, or journaling.  Consider going to group therapy with other women who are having menopause symptoms. Ask your health care provider about recommended group therapy meetings. Eating and drinking  Eat a healthy, balanced diet that contains whole grains, lean protein, low-fat dairy, and plenty of fruits and vegetables.  Your health care provider may recommend adding more soy to your diet. Foods that contain soy include tofu, tempeh, and soy milk.  Eat plenty of foods that contain calcium and vitamin D for bone health. Items that are rich in calcium include low-fat milk, yogurt, beans, almonds, sardines, broccoli, and kale. Medicines  Take over-the-counter and prescription medicines only as told by your health care provider.  Talk with your health care provider before starting any herbal supplements. If prescribed, take vitamins and supplements as told by your health care provider. These may include: ? Calcium. Women age 55 and older should get 1,200 mg (milligrams) of calcium every day. ? Vitamin D. Women need 600-800 International Units of vitamin D each day. ? Vitamins B12 and B6. Aim for 50 micrograms of B12 and 1.5 mg of B6 each day. General instructions  Keep track of your menstrual periods, including: ? When they occur. ? How heavy they are and how long they last. ? How much time passes between periods.  Keep track of your symptoms, noting  when they start, how often you have them, and how long they last.  Use vaginal lubricants or moisturizers to help with vaginal dryness and improve comfort during sex.  Keep all follow-up visits as told by your health care provider. This is important. This includes any group therapy or counseling. Contact a health care provider if:  You are still having menstrual periods after age 15.  You have pain during sex.  You have not had a period for 12 months and you develop vaginal bleeding. Get help right away if:  You have: ? Severe depression. ? Excessive vaginal bleeding. ? Pain when you urinate. ? A fast or irregular heart beat (palpitations). ? Severe headaches. ? Abdomen (abdominal) pain or severe indigestion.  You fell and you think you have a broken bone.  You  develop leg or chest pain.  You develop vision problems.  You feel a lump in your breast. Summary  Menopause is the normal time of life when menstrual periods stop completely. It is usually confirmed by 12 months without a menstrual period.  The transition to menopause (perimenopause) most often happens between the ages of 49 and 78.  Symptoms can be managed through medicines, lifestyle changes, and complementary therapies such as acupuncture.  Eat a balanced diet that is rich in nutrients to promote bone health and heart health and to manage symptoms during menopause. This information is not intended to replace advice given to you by your health care provider. Make sure you discuss any questions you have with your health care provider. Document Revised: 10/14/2017 Document Reviewed: 12/04/2016 Elsevier Patient Education  2020 [employer].

## 2020-09-25 ENCOUNTER — Telehealth: Payer: Self-pay | Admitting: *Deleted

## 2020-09-25 NOTE — Telephone Encounter (Signed)
Yes we can switch back to [facility] if she prefers. It does have a higher dose of estrogen that may have better management of her PMS symptoms. Or she could take the Loestrin continuously and skip the placebo week for 2 months and then take the placebo week the 3rd month where she would have a cycle. Whichever one she prefers.

## 2020-09-25 NOTE — Telephone Encounter (Signed)
Patient called requesting if Loestrin FE 1/20 could be switched [place]? Report she has taken this pill in the past and did well on it and did not have a cycle but every 3 months. Patient said she having horrible PMS symptoms and already suffers from depression and would prefer not to have PMS symptoms. Patient said you can call her if needed. Please advise

## 2020-09-25 NOTE — Telephone Encounter (Signed)
Patient informed she will keep the Loestrin Rx and try the below.

## 2020-09-29 ENCOUNTER — Telehealth: Payer: Self-pay | Admitting: Cardiology

## 2020-09-29 NOTE — Telephone Encounter (Signed)
STAT if HR is under 50 or over 120 (normal HR is 60-100 beats per minute)  1) What is your heart rate? 110  2) Do you have a log of your heart rate readings (document readings)? No 3) Do you have any other symptoms? No  Patient states this morning while cooking breakfast she checked her watch and her HR is elevated. She states she is feeling fine and has not had any other symptoms, but she would like to know if she needs to go to the ED.

## 2020-09-29 NOTE — Telephone Encounter (Signed)
I would recommend to hydrate well, continue monitoring HR, if remains above 100 by tomorrow or any symptoms occur, please bring her to the clinic soon for ECG

## 2020-09-29 NOTE — Telephone Encounter (Signed)
Spoke with the pt and informed her that per Dr. Delton See, she wants her to increase her water intake, continue to monitor her HR, and if she notices her HR staying at 100 bpm or greater by tomorrow,  or she starts to develop symptoms, then she should call us back or send [place] a mychart message, so that we can arrange for her an appt with an APP, to get further assessed and have an EKG done.  Pt verbalized understanding and agrees with this plan. Pt was more than gracious for all the assistance provided.

## 2020-09-29 NOTE — Telephone Encounter (Signed)
Pt is calling in to report to Dr. Delton See and myself that when she woke up this morning, she noticed on her apple watch that her HR-100 bpm. Pt states when she was up in the kitchen cooking this morning, then she looked down at her watch and her HR-110 bpm.  Pt states she usually runs in the 70-80s.  Pt states she is completely asymptomatic from a cardiac perspective.  Pt states she has NO SOB, DOE, CP, palpitations, fluttering, dizziness, pre-syncopal or syncopal episodes.  She states she feels completely fine and would've never known she was having elevated HRs, unless she checked her apple watch.  Pt states she is taking her cardiac med regimen as prescribed. Pt did add that she drinks coffee all day long, drinking several cups.  Pt states she is trying to lose weight, and has gone from 220 lbs to now she weighs 206 lbs.  She states drinking coffee all day helps curve her appetite.   She states she only drinks water at night for she can't have coffee or it will keep her up.   Pt states she does have anxiety as well.  Advised the pt to modify and limit her coffee, caffeine, soda, tea and chocolate intake.  Advised her to increase her PO water intake.  Advised her to continue her current med regimen.  Informed her that I will route this message to Dr. Delton See for further advisement.  Informed her that I will follow-up with her shortly thereafter, with what she recommends.  ED precautions reviewed with the pt, incase she does develop symptoms. Pt verbalized understanding and agrees with this plan.

## 2020-10-22 NOTE — Progress Notes (Signed)
Virtual Visit via Telephone Note   This visit type was conducted due to national recommendations for restrictions regarding the COVID-19 Pandemic (e.g. social distancing) in an effort to limit this patient's exposure and mitigate transmission in our community.  Due to her co-morbid illnesses, this patient is at least at moderate risk for complications without adequate follow up.  This format is felt to be most appropriate for this patient at this time.  The patient did not have access to video technology/had technical difficulties with video requiring transitioning to audio format only (telephone).  All issues noted in this document were discussed and addressed.  No physical exam could be performed with this format.  Please refer to the patient's chart for her  consent to telehealth for [facility].    Date:  10/29/2020   ID:  Cassandra Leach, DOB 1971-06-18, MRN [id number] The patient was identified using 2 identifiers.  Patient Location: Home Provider Location: Home Office  PCP:  Waldon Merl, PA-C  Cardiologist:  Tobias Alexander, MD   Electrophysiologist:  None   Evaluation Performed:  Follow-Up Visit  Chief Complaint:F/U  History of Present Illness:    Cassandra Leach is a 49 y.o. female with history of SVT on Bystolic, did not tolerate Toprol, also has asthma and is overweight.  2D echo in [facility] cardiology normal LVEF 60 to 65% with mild MR.  Patient last saw Dr. Delton See 03/19/2020 exercise and weight loss recommended.  Patient called in 09/29/2020 with elevated heart rates.  Dr. Delton See asked her to increase her water intake and monitor her heart rates.  Patient says she's had no further fast HR's. She only knew it was high because her apple watch told her. She didn't feel it. She has cut back on her caffeine(4-5 cups/day) down to 2-3/day. She has lost 31 lbs doing weight watchers. She got a puppy and is walking more. Getting 7000-9000 steps/day.No chest pain, dyspnea,  dizziness or presyncope.   The patient does not have symptoms concerning for COVID-19 infection (fever, chills, cough, or new shortness of breath).    Past Medical History:  Diagnosis Date  . Anxiety   . Asthma   . Depression   . History of migraine    No issue in 2 years  . IBS (irritable bowel syndrome)   . Insomnia   . Mild sleep apnea   . Prediabetes   . SVT (supraventricular tachycardia) (HCC)   . Vitamin D deficiency    Past Surgical History:  Procedure Laterality Date  . RADIOFREQUENCY ABLATION NERVES       Current Meds  Medication Sig  . ARIPiprazole (ABILIFY) 10 MG tablet Take 10 mg by mouth daily.  Marland Kitchen buPROPion (WELLBUTRIN XL) 150 MG 24 hr tablet Take 150 mg by mouth at bedtime.  . busPIRone (BUSPAR) 30 MG tablet Take 30 mg by mouth in the morning and at bedtime.  . Cholecalciferol (VITAMIN D3) 5000 units CAPS Take 1 capsule by mouth daily.  . DULoxetine (CYMBALTA) 30 MG capsule Take 120 mg by mouth daily.  . Insulin Pen Needle (BD PEN NEEDLE NANO 2ND GEN) 32G X 4 MM MISC 1 each by Does not apply route daily.  . nebivolol (BYSTOLIC) 5 MG tablet Take 1 tablet (5 mg total) by mouth daily.  . temazepam (RESTORIL) 15 MG capsule Take 1 to 2 capsules by mouth at bedtime as needed for sleep  . tiZANidine (ZANAFLEX) 4 MG tablet Take 1 tablet (4 mg total) by mouth every  8 (eight) hours as needed for muscle spasms.     Allergies:   Penicillins and Sulfa antibiotics   Social History   Tobacco Use  . Smoking status: Never Smoker  . Smokeless tobacco: Never Used  Vaping Use  . Vaping Use: Former  . Quit date: 11/15/2018  Substance Use Topics  . Alcohol use: Yes    Comment: OCC  . Drug use: No     Family Hx: The patient's family history includes Angina in her paternal grandmother; Hyperlipidemia in her mother; Hypertension in her mother; Suicidality in her brother.  ROS:   Please see the history of present illness.      All other systems reviewed and are  negative.   Prior CV studies:   The following studies were reviewed today:  [facility] LVEF 60 to 65% with mild MR.   Labs/Other Tests and Data Reviewed:    EKG:  No ECG reviewed.  Recent Labs: 05/05/2020: ALT 25; Hemoglobin 13.3; Platelets 258.0; TSH 2.30   Recent Lipid Panel Lab Results  Component Value Date/Time   CHOL 238 (H) 05/13/2020 02:40 PM   TRIG 270 (H) 05/13/2020 02:40 PM   HDL 34 (L) 05/13/2020 02:40 PM   CHOLHDL 5 11/18/2017 08:01 AM   LDLCALC 154 (H) 05/13/2020 02:40 PM    Wt Readings from Last 3 Encounters:  10/29/20 193 lb (87.5 kg)  05/13/20 221 lb (100.2 kg)  03/19/20 216 lb 3.2 oz (98.1 kg)     Risk Assessment/Calculations:      Objective:    Vital Signs:  Ht 5\' 5"  (1.651 m)   Wt 193 lb (87.5 kg)   BMI 32.12 kg/m   BP runs low 100/60  VITAL SIGNS:  reviewed  ASSESSMENT & PLAN:    SVT on Bystolic. Fast HR's a month ago resolved with decrease in caffeine.  Hyperlipidemia LDL 154  04/2020 goal Less than 90. Working on diet and exercise. To recheck with PCP  Obesity on weight watchers and having success.  Shared Decision Making/Informed Consent        COVID-19 Education: The signs and symptoms of COVID-19 were discussed with the patient and how to seek care for testing (follow up with PCP or arrange E-visit).   The importance of social distancing was discussed today.  Time:   Today, I have spent 12:23 minutes with the patient with telehealth technology discussing the above problems.     Medication Adjustments/Labs and Tests Ordered: Current medicines are reviewed at length with the patient today.  Concerns regarding medicines are outlined above.   Tests Ordered: No orders of the defined types were placed in this encounter.   Medication Changes: No orders of the defined types were placed in this encounter.   Follow Up:  In Person in 1 year(s) Dr. 05/2020  Signed, Delton See, PA-C  10/29/2020  1:30 PM    [facility]

## 2020-10-29 ENCOUNTER — Telehealth (INDEPENDENT_AMBULATORY_CARE_PROVIDER_SITE_OTHER): Admitting: Physician Assistant

## 2020-10-29 ENCOUNTER — Encounter: Payer: Self-pay | Admitting: Physician Assistant

## 2020-10-29 ENCOUNTER — Other Ambulatory Visit: Payer: Self-pay

## 2020-10-29 VITALS — Ht 65.0 in | Wt 193.0 lb

## 2020-10-29 DIAGNOSIS — I471 Supraventricular tachycardia: Secondary | ICD-10-CM | POA: Diagnosis not present

## 2020-10-29 DIAGNOSIS — E785 Hyperlipidemia, unspecified: Secondary | ICD-10-CM | POA: Diagnosis not present

## 2020-10-29 NOTE — Patient Instructions (Signed)
Medication Instructions:  Your physician recommends that you continue on your current medications as directed. Please refer to the Current Medication list given to you today.  *If you need a refill on your cardiac medications before your next appointment, please call your pharmacy*  Follow up: At [facility], you and your health needs are our priority.  As part of our continuing mission to provide you with exceptional heart care, we have created designated Provider Care Teams.  These Care Teams include your primary Cardiologist (physician) and Advanced Practice Providers (APPs -  Physician Assistants and Nurse Practitioners) who all work together to provide you with the care you need, when you need it.  Your next appointment:   1 year(s)  The format for your next appointment:   In Person  Provider:   You may see Tobias Alexander, MD or one of the following Advanced Practice Providers on your designated Care Team:    Ronie Spies, PA-C  Jacolyn Reedy, PA-C  Other Instructions Your provider recommends that you maintain 150 minutes per week of moderate aerobic activity.

## 2020-10-30 ENCOUNTER — Other Ambulatory Visit: Payer: Self-pay | Admitting: Physician Assistant

## 2020-10-30 DIAGNOSIS — F5101 Primary insomnia: Secondary | ICD-10-CM

## 2020-10-30 NOTE — Telephone Encounter (Signed)
LFD 08/01/20 # 60 with 2 refills LOV 07/16/20 sore throat NOV none

## 2020-11-18 ENCOUNTER — Encounter: Admitting: Nurse Practitioner

## 2020-11-20 ENCOUNTER — Other Ambulatory Visit: Payer: Self-pay | Admitting: Family Medicine

## 2020-11-20 NOTE — Telephone Encounter (Signed)
LFD 08/05/20 #10 with no refills LOV 07/16/20 NOV none

## 2020-12-02 ENCOUNTER — Encounter: Payer: Self-pay | Admitting: Physician Assistant

## 2021-01-02 ENCOUNTER — Other Ambulatory Visit: Payer: Self-pay | Admitting: Physician Assistant

## 2021-01-02 DIAGNOSIS — F5101 Primary insomnia: Secondary | ICD-10-CM

## 2021-01-02 NOTE — Telephone Encounter (Signed)
Temazepam last rx 10/30/20 #60 1 RF LOV: 07/16/20 Sore Throat

## 2021-01-16 ENCOUNTER — Telehealth (INDEPENDENT_AMBULATORY_CARE_PROVIDER_SITE_OTHER): Admitting: Internal Medicine

## 2021-01-16 ENCOUNTER — Encounter: Payer: Self-pay | Admitting: Internal Medicine

## 2021-01-16 VITALS — HR 80 | Ht 65.0 in | Wt 195.0 lb

## 2021-01-16 DIAGNOSIS — G44209 Tension-type headache, unspecified, not intractable: Secondary | ICD-10-CM | POA: Diagnosis not present

## 2021-01-16 MED ORDER — MELOXICAM 7.5 MG PO TABS: 7.5000 mg | ORAL_TABLET | Freq: Every day | ORAL | 0 refills | Status: DC | PRN

## 2021-01-16 MED ORDER — TIZANIDINE HCL 4 MG PO TABS
4.0000 mg | ORAL_TABLET | Freq: Three times a day (TID) | ORAL | 0 refills | Status: DC | PRN
Start: 2021-01-16 — End: 2021-05-29

## 2021-01-16 NOTE — Progress Notes (Signed)
Subjective:    Patient ID: Cassandra Leach, female    DOB: 1971-05-20, 50 y.o.   MRN: [id number]  DOS:  01/16/2021 Type of visit - description: Virtual Visit via Video Note  I connected with the above patient  by a video enabled telemedicine application and verified that I am speaking with the correct person using two identifiers.   THIS ENCOUNTER IS A VIRTUAL VISIT DUE TO COVID-19 - PATIENT WAS NOT SEEN IN THE OFFICE. PATIENT HAS CONSENTED TO VIRTUAL VISIT / TELEMEDICINE VISIT   Location of patient: home  Location of provider: office  Persons participating in the virtual visit: patient, provider   I discussed the limitations of evaluation and management by telemedicine and the availability of in person appointments. The patient expressed understanding and agreed to proceed.  Acute  Having a headache for 2 days, the headache is steady and similar to previous tensional headaches. Her previous PCP used to prescribe a muscle relaxant and meloxicam with good results and request a refill.  She denies fever chills This is not the worst headache of her life Again symptoms are consistent with previous tensional headaches.   Review of Systems See above   Past Medical History:  Diagnosis Date  . Anxiety   . Asthma   . Depression   . History of migraine    No issue in 2 years  . IBS (irritable bowel syndrome)   . Insomnia   . Mild sleep apnea   . Prediabetes   . SVT (supraventricular tachycardia) (HCC)   . Vitamin D deficiency     Past Surgical History:  Procedure Laterality Date  . RADIOFREQUENCY ABLATION NERVES      Allergies as of 01/16/2021      Reactions   Penicillins Nausea And Vomiting   Sulfa Antibiotics Nausea And Vomiting      Medication List       Accurate as of January 16, 2021  1:44 PM. If you have any questions, ask your nurse or doctor.        STOP taking these medications   BD Pen Needle Nano 2nd Gen 32G X 4 MM Misc Generic drug: Insulin Pen  Needle Stopped by: Willow Ora, MD   DULoxetine 30 MG capsule Commonly known as: CYMBALTA Stopped by: Willow Ora, MD     TAKE these medications   ARIPiprazole 10 MG tablet Commonly known as: ABILIFY Take 10 mg by mouth daily.   buPROPion 150 MG 24 hr tablet Commonly known as: WELLBUTRIN XL Take 150 mg by mouth at bedtime.   busPIRone 30 MG tablet Commonly known as: BUSPAR Take 30 mg by mouth in the morning and at bedtime.   nebivolol 5 MG tablet Commonly known as: Bystolic Take 1 tablet (5 mg total) by mouth daily.   temazepam 15 MG capsule Commonly known as: RESTORIL TAKE 1 TO 2 CAPSULES BY MOUTH AT BEDTIME AS NEEDED FOR SLEEP   tiZANidine 4 MG tablet Commonly known as: ZANAFLEX TAKE 1 TABLET(4 MG) BY MOUTH EVERY 8 HOURS AS NEEDED FOR MUSCLE SPASMS   Trintellix 20 MG Tabs tablet Generic drug: vortioxetine HBr Take 20 mg by mouth daily.   Vitamin D3 125 MCG (5000 UT) Caps Take 1 capsule by mouth daily.          Objective:   Physical Exam Pulse 80   Ht 5\' 5"  (1.651 m)   Wt 195 lb (88.5 kg)   LMP 01/15/2021 (Exact Date)   BMI 32.45 kg/m  This is a virtual video protocol, she is alert oriented x3, in no distress, face is symmetric, speech is normal.    Assessment    50 year old female, PMH includes anxiety depression, SVT, not on birth control, husband  had a vasectomy, presents with:  Tensional headache: Symptoms for 2 days, pain is steady, headache is consistent with previous tensional headache, nothing unusual. Request a refill, will send Zanaflex (watch for excessive sedation). Also RF meloxicam, noted interaction with Trintellix (increased risk of bleeding), GI precautions discussed with the patient, she verbalized understanding. Seek attention if headache becomes different or intense.    I discussed the assessment and treatment plan with the patient. The patient was provided an opportunity to ask questions and all were answered. The patient agreed with  the plan and demonstrated an understanding of the instructions.   The patient was advised to call back or seek an in-person evaluation if the symptoms worsen or if the condition fails to improve as anticipated.

## 2021-02-16 ENCOUNTER — Encounter: Payer: Self-pay | Admitting: Nurse Practitioner

## 2021-02-16 ENCOUNTER — Ambulatory Visit (INDEPENDENT_AMBULATORY_CARE_PROVIDER_SITE_OTHER): Admitting: Nurse Practitioner

## 2021-02-16 ENCOUNTER — Other Ambulatory Visit: Payer: Self-pay

## 2021-02-16 VITALS — BP 120/78

## 2021-02-16 DIAGNOSIS — N6452 Nipple discharge: Secondary | ICD-10-CM | POA: Diagnosis not present

## 2021-02-16 DIAGNOSIS — N6325 Unspecified lump in the left breast, overlapping quadrants: Secondary | ICD-10-CM | POA: Diagnosis not present

## 2021-02-16 NOTE — Progress Notes (Signed)
   Acute Office Visit  Subjective:    Patient ID: Cassandra Leach, female    DOB: 1971/02/16, 50 y.o.   MRN: [id number]   HPI 50 y.o. presents today for left breast lump. She first noticed it yesterday when her arm rubbed across her chest and it was tender. Reports having dried green spots on bilateral nipples that she thinks may be discharge. Has had nipple discharge years ago with negative workup. Denies redness or swelling. 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 6 month follow-up was recommended but not done.     Review of Systems  Constitutional: Negative.   Left breast: Positive for mass in outer region, tender to touch. Positive for nipple discharge. Right breast: Negative for mass. Positive for nipple discharge.      Objective:    Physical Exam Constitutional:      Appearance: Normal appearance.  Chest:  Breasts:     Right: Nipple discharge (small dried dark green spots on nipple) present. No swelling, bleeding, inverted nipple, mass, skin change or tenderness.     Left: Mass, nipple discharge (small dried dark green spots on nipple) and tenderness present. No swelling, bleeding, inverted nipple or skin change.        BP 120/78   LMP 02/02/2021  Wt Readings from Last 3 Encounters:  01/16/21 195 lb (88.5 kg)  10/29/20 193 lb (87.5 kg)  05/13/20 221 lb (100.2 kg)        Assessment & Plan:   Problem List Items Addressed This Visit   None   Visit Diagnoses    Breast lump on left side at 3 o'clock position    -  Primary   Nipple discharge       Relevant Orders   Prolactin      Plan: Exam shows possible dried green discharge bilaterally, will check Prolactin. Will send referral for left breast ultrasound to evaluate mass. She is also due for screening mammogram this month so we will try to do that as well. She is agreeable to plan.     Cassandra Mackie DNP, 4:40 PM 02/16/2021

## 2021-02-17 ENCOUNTER — Telehealth: Payer: Self-pay | Admitting: *Deleted

## 2021-02-17 DIAGNOSIS — N6325 Unspecified lump in the left breast, overlapping quadrants: Secondary | ICD-10-CM

## 2021-02-17 LAB — PROLACTIN: Prolactin: 12.7 ng/mL

## 2021-02-17 NOTE — Telephone Encounter (Signed)
Patient scheduled at the breast center on 03/27/21 @ 8:00am. Patient informed with below note,aware she can call the breast daily/weekly to check for any cancellations.

## 2021-02-17 NOTE — Telephone Encounter (Signed)
-----   Message from Olivia Mackie, NP sent at 02/16/2021  4:41 PM EDT ----- Please send referral for left breast ultrasound for mass @ 2:30. She is also due for screening mammogram this month if she is also able to do that at the same time. Thank you.

## 2021-03-09 ENCOUNTER — Ambulatory Visit: Admitting: Nurse Practitioner

## 2021-03-27 ENCOUNTER — Other Ambulatory Visit: Payer: Self-pay

## 2021-03-27 ENCOUNTER — Ambulatory Visit
Admission: RE | Admit: 2021-03-27 | Discharge: 2021-03-27 | Disposition: A | Discharge status: Home / Self Care | Source: Ambulatory Visit | Attending: Nurse Practitioner | Admitting: Nurse Practitioner

## 2021-03-27 ENCOUNTER — Ambulatory Visit
Admission: RE | Admit: 2021-03-27 | Discharge: 2021-03-27 | Disposition: A | Discharge status: Home / Self Care | Payer: PRIVATE HEALTH INSURANCE | Source: Ambulatory Visit | Attending: Nurse Practitioner | Admitting: Nurse Practitioner

## 2021-03-27 DIAGNOSIS — N6325 Unspecified lump in the left breast, overlapping quadrants: Secondary | ICD-10-CM

## 2021-05-25 ENCOUNTER — Encounter: Payer: Self-pay | Admitting: Registered Nurse

## 2021-05-25 ENCOUNTER — Ambulatory Visit (INDEPENDENT_AMBULATORY_CARE_PROVIDER_SITE_OTHER): Payer: PRIVATE HEALTH INSURANCE | Admitting: Registered Nurse

## 2021-05-25 ENCOUNTER — Other Ambulatory Visit: Payer: Self-pay

## 2021-05-25 VITALS — BP 104/74 | HR 62 | Temp 98.2°F | Resp 18 | Ht 65.0 in | Wt 195.8 lb

## 2021-05-25 DIAGNOSIS — F5104 Psychophysiologic insomnia: Secondary | ICD-10-CM

## 2021-05-25 MED ORDER — QUETIAPINE FUMARATE 25 MG PO TABS: 25.0000 mg | ORAL_TABLET | Freq: Every day | ORAL | 0 refills | Status: DC

## 2021-05-25 MED ORDER — HYDROXYZINE PAMOATE 50 MG PO CAPS: 50.0000 mg | ORAL_CAPSULE | Freq: Every evening | ORAL | 0 refills | Status: DC | PRN

## 2021-05-25 NOTE — Patient Instructions (Addendum)
[name] -   Cassandra Leach to meet you. In brief:  Medications we are trying:  Seroquel (quetiapine) - second generation antipsychotic - take 25 - 50 mg by mouth about one hour before bed as needed for sleep.   Atarax/Vistaril (hydroxyzine) - antihistamine - take 50-100mg  by mouth about one hour before bed as needed for sleep.  I do not think these will have any substantial side effects for you but if you notice any please stop taking them and let me know.  Should also consider:  Deseryl (trazodone) - atypical antidepressant - you have been on this in the past at a very low dose (25mg ) but it could be worth considering at a higher dose (50, 100, or 150mg ). I would run this by your psych provider to get her thoughts.  See you in a few weeks, sooner with any concerns  Thank you  [name]    If you have lab work done today you will be contacted with your lab results within the next 2 weeks.  If you have not heard from then please contact . The fastest way to get your results is to register for My Chart.   IF you received an x-ray today, you will receive an invoice from [facility]. Please contact [facility] at [phone] with questions or concerns regarding your invoice.   IF you received labwork today, you will receive an invoice from [facility]. Please contact [facility] at [phone] with questions or concerns regarding your invoice.   Our billing staff will not be able to assist you with questions regarding bills from these companies.  You will be contacted with the lab results as soon as they are available. The fastest way to get your results is to activate your My Chart account. Instructions are located on the last page of this paperwork. If you have not heard from [facility] regarding the results in 2 weeks, please contact this office.

## 2021-05-25 NOTE — Progress Notes (Signed)
Established Patient Office Visit  Subjective:  Patient ID: Cassandra Leach, female    DOB: 17-Oct-1971  Age: 50 y.o. MRN: [id number]  CC:  Chief Complaint  Patient presents with   Insomnia    Patient states she is here for insomnia patient states she has been taking a prescription that no longer works. Patient even tried benadryl for a week and had trouble falling asleep.    HPI Cassandra Leach presents for insomnia  Ongoing issue for her - has been on multiple medications including benadryl, lunesta, ambien, melatonin, and briefly trazodone but only at 25mg   Most recently temazepam 15mg  PO qhs Unfortunately has not been effective lately.   Has not been on seroquel or hydroxyzine. Willing to try either  Endorses good sleep hygiene.  Past Medical History:  Diagnosis Date   Anxiety    Asthma    Depression    History of migraine    No issue in 2 years   IBS (irritable bowel syndrome)    Insomnia    Mild sleep apnea    Prediabetes    SVT (supraventricular tachycardia) (HCC)    Vitamin D deficiency     Past Surgical History:  Procedure Laterality Date   RADIOFREQUENCY ABLATION NERVES      Family History  Problem Relation Age of Onset   Hypertension Mother    Hyperlipidemia Mother    Suicidality Brother    Angina Paternal Grandmother     Social History   Socioeconomic History   Marital status: Married    Spouse name: Not on file   Number of children: Not on file   Years of education: Not on file   Highest education level: Not on file  Occupational History   Occupation: stay at home spouse  Tobacco Use   Smoking status: Never   Smokeless tobacco: Never  Vaping Use   Vaping Use: Former   Quit date: 11/15/2018  Substance and Sexual Activity   Alcohol use: Yes    Comment: OCC   Drug use: No   Sexual activity: Yes    Birth control/protection: Other-see comments    Comment: VAS.  Other Topics Concern   Not on file  Social History Narrative   Not on file    Social Determinants of Health   Financial Resource Strain: Not on file  Food Insecurity: Not on file  Transportation Needs: Not on file  Physical Activity: Not on file  Stress: Not on file  Social Connections: Not on file  Intimate Partner Violence: Not on file    Outpatient Medications Prior to Visit  Medication Sig Dispense Refill   ARIPiprazole (ABILIFY) 10 MG tablet Take 10 mg by mouth daily.     buPROPion (WELLBUTRIN XL) 150 MG 24 hr tablet Take 150 mg by mouth at bedtime.     busPIRone (BUSPAR) 30 MG tablet Take 30 mg by mouth in the morning and at bedtime.     Cholecalciferol (VITAMIN D3) 5000 units CAPS Take 1 capsule by mouth daily.     nebivolol (BYSTOLIC) 5 MG tablet Take 1 tablet (5 mg total) by mouth daily. 90 tablet 3   temazepam (RESTORIL) 15 MG capsule TAKE 1 TO 2 CAPSULES BY MOUTH AT BEDTIME AS NEEDED FOR SLEEP 60 capsule 0   tiZANidine (ZANAFLEX) 4 MG tablet Take 1 tablet (4 mg total) by mouth every 8 (eight) hours as needed (headache). 30 tablet 0   TRINTELLIX 20 MG TABS tablet Take 20 mg by mouth  daily.     meloxicam (MOBIC) 7.5 MG tablet Take 1 tablet (7.5 mg total) by mouth daily as needed for pain. (Patient not taking: Reported on 05/25/2021) 30 tablet 0   No facility-administered medications prior to visit.    Allergies  Allergen Reactions   Penicillins Nausea And Vomiting   Sulfa Antibiotics Nausea And Vomiting    ROS Review of Systems  Constitutional: Negative.   HENT: Negative.    Eyes: Negative.   Respiratory: Negative.    Cardiovascular: Negative.   Gastrointestinal: Negative.   Genitourinary: Negative.   Musculoskeletal: Negative.   Skin: Negative.   Neurological: Negative.   Psychiatric/Behavioral: Negative.    All other systems reviewed and are negative.    Objective:    Physical Exam Vitals and nursing note reviewed.  Constitutional:      General: She is not in acute distress.    Appearance: Normal appearance. She is normal  weight. She is not ill-appearing, toxic-appearing or diaphoretic.  Cardiovascular:     Rate and Rhythm: Normal rate and regular rhythm.     Heart sounds: Normal heart sounds. No murmur heard.   No friction rub. No gallop.  Pulmonary:     Effort: Pulmonary effort is normal. No respiratory distress.     Breath sounds: Normal breath sounds. No stridor. No wheezing, rhonchi or rales.  Chest:     Chest wall: No tenderness.  Skin:    General: Skin is warm and dry.  Neurological:     General: No focal deficit present.     Mental Status: She is alert and oriented to person, place, and time. Mental status is at baseline.  Psychiatric:        Mood and Affect: Mood normal.        Behavior: Behavior normal.        Thought Content: Thought content normal.        Judgment: Judgment normal.    BP 104/74   Pulse 62   Temp 98.2 F (36.8 C) (Temporal)   Resp 18   Ht 5\' 5"  (1.651 m)   Wt 195 lb 12.8 oz (88.8 kg)   SpO2 99%   BMI 32.58 kg/m  Wt Readings from Last 3 Encounters:  05/25/21 195 lb 12.8 oz (88.8 kg)  01/16/21 195 lb (88.5 kg)  10/29/20 193 lb (87.5 kg)     Health Maintenance Due  Topic Date Due   HIV Screening  Never done    There are no preventive care reminders to display for this patient.  Lab Results  Component Value Date   TSH 2.30 05/05/2020   Lab Results  Component Value Date   WBC 8.3 05/05/2020   HGB 13.3 05/05/2020   HCT 58.8 (H) 05/13/2020   MCV 88.2 05/05/2020   PLT 258.0 05/05/2020   Lab Results  Component Value Date   NA 136 10/23/2019   K 4.1 10/23/2019   CO2 28 10/23/2019   GLUCOSE 117 (H) 10/23/2019   BUN 10 10/23/2019   CREATININE 0.73 10/23/2019   BILITOT 0.3 05/05/2020   ALKPHOS 53 05/05/2020   AST 19 05/05/2020   ALT 25 05/05/2020   PROT 6.8 05/05/2020   ALBUMIN 4.1 05/05/2020   CALCIUM 9.2 10/23/2019   GFR 84.16 10/23/2019   Lab Results  Component Value Date   CHOL 238 (H) 05/13/2020   Lab Results  Component Value Date    HDL 34 (L) 05/13/2020   Lab Results  Component Value Date   LDLCALC  154 (H) 05/13/2020   Lab Results  Component Value Date   TRIG 270 (H) 05/13/2020   Lab Results  Component Value Date   CHOLHDL 5 11/18/2017   Lab Results  Component Value Date   HGBA1C 6.0 05/05/2020      Assessment & Plan:   Problem List Items Addressed This Visit   None Visit Diagnoses     Psychophysiological insomnia    -  Primary   Relevant Medications   QUEtiapine (SEROQUEL) 25 MG tablet   hydrOXYzine (VISTARIL) 50 MG capsule       No orders of the defined types were placed in this encounter.   Follow-up: No follow-ups on file.   PLAN Will give two options for PRN use - seroquel 25-50mg  PO qhs PRN and hydroxyzine 50-100mg  PO qhs PRN Start with low dose, titrate up as needed Follow up with psych provider as scheduled next week Pt will follow up with me as scheduled in 1 mo for CPE and labs Patient encouraged to call clinic with any questions, comments, or concerns.  Janeece Agee, NP

## 2021-05-29 ENCOUNTER — Ambulatory Visit (INDEPENDENT_AMBULATORY_CARE_PROVIDER_SITE_OTHER): Admitting: Family Medicine

## 2021-05-29 ENCOUNTER — Encounter: Payer: Self-pay | Admitting: Family Medicine

## 2021-05-29 ENCOUNTER — Other Ambulatory Visit: Payer: Self-pay

## 2021-05-29 VITALS — BP 110/80 | HR 56 | Temp 97.4°F | Resp 20 | Ht 65.0 in | Wt 199.2 lb

## 2021-05-29 DIAGNOSIS — T520X1A Toxic effect of petroleum products, accidental (unintentional), initial encounter: Secondary | ICD-10-CM

## 2021-05-29 DIAGNOSIS — T7840XA Allergy, unspecified, initial encounter: Secondary | ICD-10-CM | POA: Diagnosis not present

## 2021-05-29 MED ORDER — MUPIROCIN 2 % EX OINT: 1.0000 "application " | TOPICAL_OINTMENT | Freq: Two times a day (BID) | CUTANEOUS | 0 refills | Status: DC

## 2021-05-29 MED ORDER — PREDNISONE 10 MG PO TABS: ORAL_TABLET | ORAL | 0 refills | Status: DC

## 2021-05-29 NOTE — Patient Instructions (Signed)
Follow up as needed or as scheduled Start the mupirocin ointment twice daily until things improve This will most likely scab and that's ok Start Claritin or Zyrtec as needed for the rash/itching You can use a thin layer of hydrocortisone cream if needed ICE!!!! IF the redness and swelling changes or worsens, start the Prednisone as directed Call with any questions or concerns Hang in there!!!

## 2021-05-29 NOTE — Progress Notes (Signed)
   Subjective:    Patient ID: Cassandra Leach, female    DOB: 1971/05/08, 50 y.o.   MRN: [id number]  HPI Allergic Rxn- pt got eyebrows waxed on Monday.  The wax ripped her skin off so she began applying Neosporin.  She subsequently developed and allergic rxn to the Neosporin.  Red, itchy.  Stopped Neosporin and started hydrocortisone cream.  States itching and rash has improved but eyes are more swollen each day.  Not painful.     Review of Systems For ROS see HPI   This visit occurred during the SARS-CoV-2 public health emergency.  Safety protocols were in place, including screening questions prior to the visit, additional usage of staff PPE, and extensive cleaning of exam room while observing appropriate contact time as indicated for disinfecting solutions.      Objective:   Physical Exam Vitals reviewed.  Constitutional:      General: She is not in acute distress.    Appearance: She is not ill-appearing.  HENT:     Head: Normocephalic and atraumatic.  Eyes:     Extraocular Movements: Extraocular movements intact.     Conjunctiva/sclera: Conjunctivae normal.     Pupils: Pupils are equal, round, and reactive to light.      Comments: Skin abrasions under eyebrows bilaterally where wax removed the skin, R>L.  Overlying erythema w/ maculopapular rash and some swelling bilaterally of the upper lids  Neurological:     Mental Status: She is alert.          Assessment & Plan:  Medication allergic reaction- new.  Pt broke out in contact dermatitis from the neosporin she applied.  This was added to her med list and pt was instructed to use bacitracin in the future.  She is to take antihistamine to help w/ itching and if itching is severe, she can use thin layer of OTC hydrocortisone cream.  Wax injury- the wax used on pt's eyebrows was not only in the wrong place, it tore the skin off her brow bones bilaterally.  Thankfully there is no evidence of infxn but the areas remain red and  swollen.  Will have her ice, apply mupirocin ointment and monitor redness and swelling.  If swelling worsens or area bc very red and hot she will pick up prednisone prescription and start.  She is aware that this is only to be used as needed.

## 2021-06-03 ENCOUNTER — Other Ambulatory Visit: Payer: Self-pay

## 2021-06-03 MED ORDER — NEBIVOLOL HCL 5 MG PO TABS: 5.0000 mg | ORAL_TABLET | Freq: Every day | ORAL | 1 refills | Status: DC

## 2021-06-17 ENCOUNTER — Encounter: Payer: Self-pay | Admitting: Internal Medicine

## 2021-06-17 ENCOUNTER — Telehealth (INDEPENDENT_AMBULATORY_CARE_PROVIDER_SITE_OTHER): Admitting: Internal Medicine

## 2021-06-17 VITALS — Ht 65.0 in

## 2021-06-17 DIAGNOSIS — Z79899 Other long term (current) drug therapy: Secondary | ICD-10-CM

## 2021-06-17 DIAGNOSIS — G44209 Tension-type headache, unspecified, not intractable: Secondary | ICD-10-CM | POA: Diagnosis not present

## 2021-06-17 DIAGNOSIS — R519 Headache, unspecified: Secondary | ICD-10-CM | POA: Diagnosis not present

## 2021-06-17 MED ORDER — TIZANIDINE HCL 4 MG PO TABS: 4.0000 mg | ORAL_TABLET | Freq: Three times a day (TID) | ORAL | 1 refills | Status: DC | PRN

## 2021-06-17 MED ORDER — MELOXICAM 7.5 MG PO TABS: 7.5000 mg | ORAL_TABLET | Freq: Every day | ORAL | 1 refills | Status: DC | PRN

## 2021-06-17 NOTE — Progress Notes (Signed)
Virtual Visit via Video Note  I connected withNAME@ on 06/17/21 at  9:30 AM EDT by a video enabled telemedicine application and verified that I am speaking with the correct person using two identifiers. Location patient: home Location provider:work office Persons participating in the virtual visit: patient, provider  WIth national recommendations  regarding COVID 19 pandemic   video visit is advised over in office visit for this patient.  Patient aware  of the limitations of evaluation and management by telemedicine and  availability of in person appointments. and agreed to proceed.   HPI: DEAVEN BARRON presents for video visit PCP in transition not available. Patient has had a problem with headaches for quite a while previously had cervical occipital headaches deviously severe but relieved by ablation done in [place]. She now has recurrent occasional tension type headaches bitemporal without associated alarm symptoms fever.  Her previous PCP had prescribed Mobic 7.5 and tizanidine 4 mg for these headaches which she states has been quite successful. However she is out of medication and would like a refill in the interim. Her last refill was in March for 30 pills each.  Upcoming appointment is in the fall.  ROS: See pertinent positives and negatives per HPI. No hypertension is on Bystolic for SVT suppression. Past Medical History:  Diagnosis Date   Anxiety    Asthma    Depression    History of migraine    No issue in 2 years   IBS (irritable bowel syndrome)    Insomnia    Mild sleep apnea    Prediabetes    SVT (supraventricular tachycardia) (HCC)    Vitamin D deficiency     Past Surgical History:  Procedure Laterality Date   RADIOFREQUENCY ABLATION NERVES      Family History  Problem Relation Age of Onset   Hypertension Mother    Hyperlipidemia Mother    Suicidality Brother    Angina Paternal Grandmother     Social History   Tobacco Use   Smoking status: Never    Smokeless tobacco: Never  Vaping Use   Vaping Use: Former   Quit date: 11/15/2018  Substance Use Topics   Alcohol use: Yes    Comment: OCC   Drug use: No      Current Outpatient Medications:    ARIPiprazole (ABILIFY) 10 MG tablet, Take 10 mg by mouth daily., Disp: , Rfl:    buPROPion (WELLBUTRIN XL) 150 MG 24 hr tablet, Take 150 mg by mouth at bedtime., Disp: , Rfl:    Cholecalciferol (VITAMIN D3) 5000 units CAPS, Take 1 capsule by mouth daily., Disp: , Rfl:    mupirocin ointment (BACTROBAN) 2 %, Apply 1 application topically 2 (two) times daily., Disp: 30 g, Rfl: 0   nebivolol (BYSTOLIC) 5 MG tablet, Take 1 tablet (5 mg total) by mouth daily. Pt needs to keep upcoming appt in Dec for further refills, Disp: 90 tablet, Rfl: 1   predniSONE (DELTASONE) 10 MG tablet, 3 tabs x3 days and then 2 tabs x3 days and then 1 tab x3 days.  Take w/ food., Disp: 18 tablet, Rfl: 0   traZODone (DESYREL) 50 MG tablet, Take 100 mg by mouth at bedtime as needed., Disp: , Rfl:    TRINTELLIX 20 MG TABS tablet, Take 20 mg by mouth daily., Disp: , Rfl:    meloxicam (MOBIC) 7.5 MG tablet, Take 1 tablet (7.5 mg total) by mouth daily as needed for pain (headache)., Disp: 30 tablet, Rfl: 1   tiZANidine (  ZANAFLEX) 4 MG tablet, Take 1 tablet (4 mg total) by mouth every 8 (eight) hours as needed (headache)., Disp: 30 tablet, Rfl: 1  EXAM: BP Readings from Last 3 Encounters:  05/29/21 110/80  05/25/21 104/74  02/16/21 120/78    VITALS per patient if applicable:  GENERAL: alert, pleasant articulate oriented, appears well and in no acute distress nontoxic well-appearing  HEENT: atraumatic, conjunttiva clear, no obvious abnormalities on inspection of external nose and ears  NECK: normal movements of the head and neck No respiratory distress normal color CV: no obvious cyanosis  PSYCH/NEURO: pleasant and cooperative, no obvious depression or anxiety, speech and thought processing grossly intact   ASSESSMENT AND  PLAN:  Discussed the following assessment and plan:    ICD-10-CM   1. Tension headache  G44.209     2. Recurrent headache  R51.9     3. Medication management  Z79.899      Recurrent headaches currently under reasonable control in between primary care providers requiring refill of medication that seems to help for her acute tension headaches.  History of nerve ablation for previous severe headaches so far successful. Will refill the Mobic 7.5 and tizanidine 4 as needed.  With 1 refill.  Benefit more than risk.  No alarm symptoms or findings. Suggest she track medication use and headaches :no evidence of rebound headaches at this time or alarm findings.  Counseled.   Expectant management and discussion of plan and treatment with opportunity to ask questions and all were answered. The patient agreed with the plan and demonstrated an understanding of the instructions.   Advised to call back or seek an in-person evaluation if worsening  or having  further concerns . No follow-ups on file.   Berniece Andreas, MD

## 2021-07-03 ENCOUNTER — Encounter: Payer: PRIVATE HEALTH INSURANCE | Admitting: Registered Nurse

## 2021-08-17 ENCOUNTER — Encounter: Payer: PRIVATE HEALTH INSURANCE | Admitting: Family Medicine

## 2021-09-22 ENCOUNTER — Ambulatory Visit (INDEPENDENT_AMBULATORY_CARE_PROVIDER_SITE_OTHER): Admitting: Registered Nurse

## 2021-09-22 ENCOUNTER — Encounter: Payer: Self-pay | Admitting: Registered Nurse

## 2021-09-22 ENCOUNTER — Other Ambulatory Visit: Payer: Self-pay

## 2021-09-22 VITALS — BP 119/74 | HR 74 | Temp 98.3°F | Resp 18 | Ht 65.0 in | Wt 193.4 lb

## 2021-09-22 DIAGNOSIS — R7303 Prediabetes: Secondary | ICD-10-CM

## 2021-09-22 DIAGNOSIS — M549 Dorsalgia, unspecified: Secondary | ICD-10-CM

## 2021-09-22 DIAGNOSIS — E785 Hyperlipidemia, unspecified: Secondary | ICD-10-CM | POA: Diagnosis not present

## 2021-09-22 MED ORDER — DICLOFENAC SODIUM 75 MG PO TBEC: 75.0000 mg | DELAYED_RELEASE_TABLET | Freq: Two times a day (BID) | ORAL | 0 refills | Status: DC

## 2021-09-22 MED ORDER — TIZANIDINE HCL 4 MG PO TABS: 4.0000 mg | ORAL_TABLET | Freq: Three times a day (TID) | ORAL | 1 refills | Status: DC | PRN

## 2021-09-22 NOTE — Patient Instructions (Addendum)
Ms. Cassandra Leach to see you!  Diclofenac and tizanidine for pain. Let me know if worsening Stretch 10-15 minutes three times daily.   Check out books by Cristal Ford - "eat real food, not too much, mostly plants".  Recommend mediterranean diet, colorful plate, and staying active.  Check out Yoga With Adriene on YouTube for good yoga for flexibility and toning.  See you in December!  Thanks,  Rich     If you have lab work done today you will be contacted with your lab results within the next 2 weeks.  If you have not heard from [place] then please contact us. The fastest way to get your results is to register for My Chart.   IF you received an x-ray today, you will receive an invoice from [facility]. Please contact [facility] at [phone] with questions or concerns regarding your invoice.   IF you received labwork today, you will receive an invoice from [facility]. Please contact [facility] at [phone] with questions or concerns regarding your invoice.   Our billing staff will not be able to assist you with questions regarding bills from these companies.  You will be contacted with the lab results as soon as they are available. The fastest way to get your results is to activate your My Chart account. Instructions are located on the last page of this paperwork. If you have not heard from [place] regarding the results in 2 weeks, please contact this office.

## 2021-09-22 NOTE — Progress Notes (Signed)
Established Patient Office Visit  Subjective:  Patient ID: Cassandra Leach, female    DOB: 06-Oct-1971  Age: 50 y.o. MRN: [id number]  CC:  Chief Complaint  Patient presents with   Pain    Patient states she is having pain in left shoulder blade/back for 2 days. Pt has taken some Meloxicam with no relief.    HPI Cassandra Leach presents for upper back pain  Onset 2 days ago Took meloxicam without relief. Upper left back by shoulder blade Pain with ROM through neck. Had a muscle relaxer in the past, thinks this may have helped this situation. Sitting in recliner and heat have helped. No pain with ROM in shoulder   Hx of hyperlipidemia Would like to recheck labs Has upcoming visit in Dec - would like to get them done before.   Past Medical History:  Diagnosis Date   Anxiety    Asthma    Depression    History of migraine    No issue in 2 years   IBS (irritable bowel syndrome)    Insomnia    Mild sleep apnea    Prediabetes    SVT (supraventricular tachycardia) (HCC)    Vitamin D deficiency     Past Surgical History:  Procedure Laterality Date   RADIOFREQUENCY ABLATION NERVES      Family History  Problem Relation Age of Onset   Hypertension Mother    Hyperlipidemia Mother    Suicidality Brother    Angina Paternal Grandmother     Social History   Socioeconomic History   Marital status: Married    Spouse name: Not on file   Number of children: Not on file   Years of education: Not on file   Highest education level: Not on file  Occupational History   Occupation: stay at home spouse  Tobacco Use   Smoking status: Never   Smokeless tobacco: Never  Vaping Use   Vaping Use: Former   Quit date: 11/15/2018  Substance and Sexual Activity   Alcohol use: Yes    Comment: OCC   Drug use: No   Sexual activity: Yes    Birth control/protection: Other-see comments    Comment: VAS.  Other Topics Concern   Not on file  Social History Narrative   Not on file    Social Determinants of Health   Financial Resource Strain: Not on file  Food Insecurity: Not on file  Transportation Needs: Not on file  Physical Activity: Not on file  Stress: Not on file  Social Connections: Not on file  Intimate Partner Violence: Not on file    Outpatient Medications Prior to Visit  Medication Sig Dispense Refill   ARIPiprazole (ABILIFY) 10 MG tablet Take 10 mg by mouth daily.     buPROPion (WELLBUTRIN XL) 150 MG 24 hr tablet Take 150 mg by mouth at bedtime.     Cholecalciferol (VITAMIN D3) 5000 units CAPS Take 1 capsule by mouth daily.     meloxicam (MOBIC) 7.5 MG tablet Take 1 tablet (7.5 mg total) by mouth daily as needed for pain (headache). 30 tablet 1   mupirocin ointment (BACTROBAN) 2 % Apply 1 application topically 2 (two) times daily. 30 g 0   nebivolol (BYSTOLIC) 5 MG tablet Take 1 tablet (5 mg total) by mouth daily. Pt needs to keep upcoming appt in Dec for further refills 90 tablet 1   predniSONE (DELTASONE) 10 MG tablet 3 tabs x3 days and then 2 tabs x3 days and  then 1 tab x3 days.  Take w/ food. 18 tablet 0   traZODone (DESYREL) 50 MG tablet Take 100 mg by mouth at bedtime as needed.     TRINTELLIX 20 MG TABS tablet Take 20 mg by mouth daily.     tiZANidine (ZANAFLEX) 4 MG tablet Take 1 tablet (4 mg total) by mouth every 8 (eight) hours as needed (headache). 30 tablet 1   No facility-administered medications prior to visit.    Allergies  Allergen Reactions   Penicillins Nausea And Vomiting   Sulfa Antibiotics Nausea And Vomiting   Neosporin [Bacitracin-Polymyxin B] Rash    ROS Review of Systems  Constitutional: Negative.   HENT: Negative.    Eyes: Negative.   Respiratory: Negative.    Cardiovascular: Negative.   Gastrointestinal: Negative.   Genitourinary: Negative.   Musculoskeletal: Negative.   Skin: Negative.   Neurological: Negative.   Psychiatric/Behavioral: Negative.    All other systems reviewed and are negative.     Objective:    Physical Exam Vitals and nursing note reviewed.  Constitutional:      General: She is not in acute distress.    Appearance: Normal appearance. She is normal weight. She is not ill-appearing, toxic-appearing or diaphoretic.  Cardiovascular:     Rate and Rhythm: Normal rate and regular rhythm.     Heart sounds: Normal heart sounds. No murmur heard.   No friction rub. No gallop.  Pulmonary:     Effort: Pulmonary effort is normal. No respiratory distress.     Breath sounds: Normal breath sounds. No stridor. No wheezing, rhonchi or rales.  Chest:     Chest wall: No tenderness.  Skin:    General: Skin is warm and dry.  Neurological:     General: No focal deficit present.     Mental Status: She is alert and oriented to person, place, and time. Mental status is at baseline.  Psychiatric:        Mood and Affect: Mood normal.        Behavior: Behavior normal.        Thought Content: Thought content normal.        Judgment: Judgment normal.    BP 119/74   Pulse 74   Temp 98.3 F (36.8 C) (Temporal)   Resp 18   Ht 5\' 5"  (1.651 m)   Wt 193 lb 6.4 oz (87.7 kg)   SpO2 99%   BMI 32.18 kg/m  Wt Readings from Last 3 Encounters:  09/22/21 193 lb 6.4 oz (87.7 kg)  05/29/21 199 lb 3.2 oz (90.4 kg)  05/25/21 195 lb 12.8 oz (88.8 kg)     Health Maintenance Due  Topic Date Due   Pneumococcal Vaccine 74-80 Years old (1 - PCV) Never done   Zoster Vaccines- Shingrix (2 of 2) 10/09/2019   COVID-19 Vaccine (5 - Booster for Pfizer series) 05/04/2021   INFLUENZA VACCINE  06/15/2021    There are no preventive care reminders to display for this patient.  Lab Results  Component Value Date   TSH 2.30 05/05/2020   Lab Results  Component Value Date   WBC 8.3 05/05/2020   HGB 13.3 05/05/2020   HCT 58.8 (H) 05/13/2020   MCV 88.2 05/05/2020   PLT 258.0 05/05/2020   Lab Results  Component Value Date   NA 136 10/23/2019   K 4.1 10/23/2019   CO2 28 10/23/2019   GLUCOSE  117 (H) 10/23/2019   BUN 10 10/23/2019   CREATININE 0.73 10/23/2019  BILITOT 0.3 05/05/2020   ALKPHOS 53 05/05/2020   AST 19 05/05/2020   ALT 25 05/05/2020   PROT 6.8 05/05/2020   ALBUMIN 4.1 05/05/2020   CALCIUM 9.2 10/23/2019   GFR 84.16 10/23/2019   Lab Results  Component Value Date   CHOL 238 (H) 05/13/2020   Lab Results  Component Value Date   HDL 34 (L) 05/13/2020   Lab Results  Component Value Date   LDLCALC 154 (H) 05/13/2020   Lab Results  Component Value Date   TRIG 270 (H) 05/13/2020   Lab Results  Component Value Date   CHOLHDL 5 11/18/2017   Lab Results  Component Value Date   HGBA1C 6.0 05/05/2020      Assessment & Plan:   Problem List Items Addressed This Visit   None Visit Diagnoses     Upper back pain    -  Primary   Relevant Medications   tiZANidine (ZANAFLEX) 4 MG tablet   diclofenac (VOLTAREN) 75 MG EC tablet   Prediabetes       Relevant Orders   CBC with Differential/Platelet   Hemoglobin A1c   Comprehensive metabolic panel   TSH   Hyperlipidemia, unspecified hyperlipidemia type       Relevant Orders   CBC with Differential/Platelet   Comprehensive metabolic panel   Lipid panel   TSH       Meds ordered this encounter  Medications   tiZANidine (ZANAFLEX) 4 MG tablet    Sig: Take 1 tablet (4 mg total) by mouth every 8 (eight) hours as needed (headache).    Dispense:  30 tablet    Refill:  1    Order Specific Question:   Supervising Provider    Answer:   Neva Seat, JEFFREY R [[id number]]   diclofenac (VOLTAREN) 75 MG EC tablet    Sig: Take 1 tablet (75 mg total) by mouth 2 (two) times daily.    Dispense:  30 tablet    Refill:  0    Order Specific Question:   Supervising Provider    Answer:   Neva Seat, JEFFREY R [[id number]]    Follow-up: Return if symptoms worsen or fail to improve, for LAB ONLY - then as scheduled in Dec. .   PLAN Strain of back. Will give diclofenac and tizanidine. Reviewed stretching and nonpharm. Hold other  NSAIDs when taking diclofenac. If worsening can consider PT. Future labs placed. Will review when available. Discussed diet and exercise for weight loss. Patient encouraged to call clinic with any questions, comments, or concerns.  Janeece Agee, NP

## 2021-09-23 ENCOUNTER — Other Ambulatory Visit (INDEPENDENT_AMBULATORY_CARE_PROVIDER_SITE_OTHER)

## 2021-09-23 DIAGNOSIS — R7303 Prediabetes: Secondary | ICD-10-CM

## 2021-09-23 DIAGNOSIS — E785 Hyperlipidemia, unspecified: Secondary | ICD-10-CM | POA: Diagnosis not present

## 2021-09-23 LAB — CBC WITH DIFFERENTIAL/PLATELET
Basophils Absolute: 0.1 10*3/uL (ref 0.0–0.1)
Basophils Relative: 0.8 % (ref 0.0–3.0)
Eosinophils Absolute: 0.1 10*3/uL (ref 0.0–0.7)
Eosinophils Relative: 1.5 % (ref 0.0–5.0)
HCT: 39.6 % (ref 36.0–46.0)
Hemoglobin: 13.4 g/dL (ref 12.0–15.0)
Lymphocytes Relative: 35.3 % (ref 12.0–46.0)
Lymphs Abs: 2.2 10*3/uL (ref 0.7–4.0)
MCHC: 33.8 g/dL (ref 30.0–36.0)
MCV: 88.8 fl (ref 78.0–100.0)
Monocytes Absolute: 0.5 10*3/uL (ref 0.1–1.0)
Monocytes Relative: 7.6 % (ref 3.0–12.0)
Neutro Abs: 3.4 10*3/uL (ref 1.4–7.7)
Neutrophils Relative %: 54.8 % (ref 43.0–77.0)
Platelets: 244 10*3/uL (ref 150.0–400.0)
RBC: 4.46 Mil/uL (ref 3.87–5.11)
RDW: 13 % (ref 11.5–15.5)
WBC: 6.2 10*3/uL (ref 4.0–10.5)

## 2021-09-23 LAB — LIPID PANEL
Cholesterol: 186 mg/dL (ref 0–200)
HDL: 38.5 mg/dL — ABNORMAL LOW (ref >39.00)
LDL Cholesterol: 119 mg/dL — ABNORMAL HIGH (ref 0–99)
NonHDL: 147.27
Total CHOL/HDL Ratio: 5
Triglycerides: 143 mg/dL (ref 0.0–149.0)
VLDL: 28.6 mg/dL (ref 0.0–40.0)

## 2021-09-23 LAB — COMPREHENSIVE METABOLIC PANEL
ALT: 12 U/L (ref 0–35)
AST: 14 U/L (ref 0–37)
Albumin: 4.1 g/dL (ref 3.5–5.2)
Alkaline Phosphatase: 42 U/L (ref 39–117)
BUN: 11 mg/dL (ref 6–23)
CO2: 24 mEq/L (ref 19–32)
Calcium: 8.7 mg/dL (ref 8.4–10.5)
Chloride: 104 mEq/L (ref 96–112)
Creatinine, Ser: 0.77 mg/dL (ref 0.40–1.20)
GFR: 88.59 mL/min (ref >60.00)
Glucose, Bld: 99 mg/dL (ref 70–99)
Potassium: 4.2 mEq/L (ref 3.5–5.1)
Sodium: 137 mEq/L (ref 135–145)
Total Bilirubin: 0.4 mg/dL (ref 0.2–1.2)
Total Protein: 6.6 g/dL (ref 6.0–8.3)

## 2021-09-23 LAB — TSH: TSH: 2.75 u[IU]/mL (ref 0.35–5.50)

## 2021-09-23 LAB — HEMOGLOBIN A1C: Hgb A1c MFr Bld: 5.8 % (ref 4.6–6.5)

## 2021-09-25 ENCOUNTER — Ambulatory Visit: Admit: 2021-09-25 | Admitting: Optometry

## 2021-09-25 SURGERY — DACRYOCYSTORHINOSTOMY
Anesthesia: General | Laterality: Left

## 2021-11-02 ENCOUNTER — Ambulatory Visit: Payer: PRIVATE HEALTH INSURANCE | Admitting: Physician Assistant

## 2021-11-11 ENCOUNTER — Encounter: Payer: Self-pay | Admitting: Registered Nurse

## 2021-11-11 ENCOUNTER — Ambulatory Visit (INDEPENDENT_AMBULATORY_CARE_PROVIDER_SITE_OTHER): Admitting: Registered Nurse

## 2021-11-11 ENCOUNTER — Other Ambulatory Visit: Payer: Self-pay

## 2021-11-11 VITALS — BP 111/76 | HR 68 | Temp 98.0°F | Resp 16 | Ht 65.0 in | Wt 196.0 lb

## 2021-11-11 DIAGNOSIS — M549 Dorsalgia, unspecified: Secondary | ICD-10-CM | POA: Diagnosis not present

## 2021-11-11 DIAGNOSIS — E669 Obesity, unspecified: Secondary | ICD-10-CM | POA: Diagnosis not present

## 2021-11-11 MED ORDER — TIZANIDINE HCL 4 MG PO TABS: 4.0000 mg | ORAL_TABLET | Freq: Three times a day (TID) | ORAL | 1 refills | Status: DC | PRN

## 2021-11-11 MED ORDER — DULAGLUTIDE 0.75 MG/0.5ML ~~LOC~~ SOAJ: 0.5000 mg | SUBCUTANEOUS | 0 refills | Status: DC

## 2021-11-11 MED ORDER — DICLOFENAC SODIUM 75 MG PO TBEC: 75.0000 mg | DELAYED_RELEASE_TABLET | Freq: Two times a day (BID) | ORAL | 0 refills | Status: DC

## 2021-11-11 NOTE — Progress Notes (Signed)
Established Patient Office Visit  Subjective:  Patient ID: Cassandra Leach, female    DOB: 1971/05/24  Age: 50 y.o. MRN: [id number]  CC:  Chief Complaint  Patient presents with   Transitions Of Care    Patient states she is here for a TOC and discuss labs and weight.    HPI Cassandra Leach presents for visit to est care  Wants to review labs results Last labs collected on 09/23/21 - noted to have HDL down to 38.5, LDL up to 119. A1c to 5.8. Otherwise wnl on cmp, cbc, tsh.   Otherwise, wants to discuss weight. Current BMI 32.62. She has lost significant weight in past 18 mo, around 25 lbs through diet and exercise. Feels she is plateauing.  Is planning to start Isagenix - shake plan Had an rx for liraglutide. Unfortunately this was not approved. Interested in options.   Notes thaback pain from last visit greatly improved with diclofenac and tizanidine. Would like refills of these in case of recurrence.   Past Medical History:  Diagnosis Date   Anxiety    Asthma    Depression    History of migraine    No issue in 2 years   IBS (irritable bowel syndrome)    Insomnia    Mild sleep apnea    Prediabetes    SVT (supraventricular tachycardia) (HCC)    Vitamin D deficiency     Past Surgical History:  Procedure Laterality Date   RADIOFREQUENCY ABLATION NERVES      Family History  Problem Relation Age of Onset   Hypertension Mother    Hyperlipidemia Mother    Suicidality Brother    Angina Paternal Grandmother     Social History   Socioeconomic History   Marital status: Married    Spouse name: Not on file   Number of children: Not on file   Years of education: Not on file   Highest education level: Not on file  Occupational History   Occupation: stay at home spouse  Tobacco Use   Smoking status: Never   Smokeless tobacco: Never   Tobacco comments:    Smoked when drinking in 61s.  Vaping Use   Vaping Use: Former   Quit date: 11/15/2018  Substance and Sexual  Activity   Alcohol use: Yes    Comment: OCC   Drug use: No   Sexual activity: Yes    Birth control/protection: Other-see comments    Comment: VAS.  Other Topics Concern   Not on file  Social History Narrative   Not on file   Social Determinants of Health   Financial Resource Strain: Not on file  Food Insecurity: Not on file  Transportation Needs: Not on file  Physical Activity: Not on file  Stress: Not on file  Social Connections: Not on file  Intimate Partner Violence: Not on file    Outpatient Medications Prior to Visit  Medication Sig Dispense Refill   ARIPiprazole (ABILIFY) 10 MG tablet Take 10 mg by mouth daily.     buPROPion (WELLBUTRIN XL) 150 MG 24 hr tablet Take 150 mg by mouth at bedtime.     Cholecalciferol (VITAMIN D3) 5000 units CAPS Take 1 capsule by mouth daily.     meloxicam (MOBIC) 7.5 MG tablet Take 1 tablet (7.5 mg total) by mouth daily as needed for pain (headache). 30 tablet 1   mupirocin ointment (BACTROBAN) 2 % Apply 1 application topically 2 (two) times daily. 30 g 0   nebivolol (BYSTOLIC)  5 MG tablet Take 1 tablet (5 mg total) by mouth daily. Pt needs to keep upcoming appt in Dec for further refills 90 tablet 1   predniSONE (DELTASONE) 10 MG tablet 3 tabs x3 days and then 2 tabs x3 days and then 1 tab x3 days.  Take w/ food. 18 tablet 0   traZODone (DESYREL) 50 MG tablet Take 100 mg by mouth at bedtime as needed.     TRINTELLIX 20 MG TABS tablet Take 20 mg by mouth daily.     diclofenac (VOLTAREN) 75 MG EC tablet Take 1 tablet (75 mg total) by mouth 2 (two) times daily. 30 tablet 0   tiZANidine (ZANAFLEX) 4 MG tablet Take 1 tablet (4 mg total) by mouth every 8 (eight) hours as needed (headache). 30 tablet 1   No facility-administered medications prior to visit.    Allergies  Allergen Reactions   Penicillins Nausea And Vomiting   Sulfa Antibiotics Nausea And Vomiting   Neosporin [Bacitracin-Polymyxin B] Rash    ROS Review of Systems   Constitutional: Negative.   HENT: Negative.    Eyes: Negative.   Respiratory: Negative.    Cardiovascular: Negative.   Gastrointestinal: Negative.   Genitourinary: Negative.   Musculoskeletal: Negative.   Skin: Negative.   Neurological: Negative.   Psychiatric/Behavioral: Negative.    All other systems reviewed and are negative.    Objective:    Physical Exam Vitals and nursing note reviewed.  Constitutional:      General: She is not in acute distress.    Appearance: Normal appearance. She is normal weight. She is not ill-appearing, toxic-appearing or diaphoretic.  Cardiovascular:     Rate and Rhythm: Normal rate and regular rhythm.     Heart sounds: Normal heart sounds. No murmur heard.   No friction rub. No gallop.  Pulmonary:     Effort: Pulmonary effort is normal. No respiratory distress.     Breath sounds: Normal breath sounds. No stridor. No wheezing, rhonchi or rales.  Chest:     Chest wall: No tenderness.  Skin:    General: Skin is warm and dry.  Neurological:     General: No focal deficit present.     Mental Status: She is alert and oriented to person, place, and time. Mental status is at baseline.  Psychiatric:        Mood and Affect: Mood normal.        Behavior: Behavior normal.        Thought Content: Thought content normal.        Judgment: Judgment normal.    BP 111/76    Pulse 68    Temp 98 F (36.7 C) (Temporal)    Resp 16    Ht 5\' 5"  (1.651 m)    Wt 196 lb (88.9 kg)    SpO2 99%    BMI 32.62 kg/m  Wt Readings from Last 3 Encounters:  11/11/21 196 lb (88.9 kg)  09/22/21 193 lb 6.4 oz (87.7 kg)  05/29/21 199 lb 3.2 oz (90.4 kg)     Health Maintenance Due  Topic Date Due   Pneumococcal Vaccine 90-42 Years old (1 - PCV) Never done   Zoster Vaccines- Shingrix (2 of 2) 10/09/2019   COVID-19 Vaccine (5 - Booster for Pfizer series) 05/04/2021    There are no preventive care reminders to display for this patient.  Lab Results  Component Value  Date   TSH 2.75 09/23/2021   Lab Results  Component Value Date  WBC 6.2 09/23/2021   HGB 13.4 09/23/2021   HCT 39.6 09/23/2021   MCV 88.8 09/23/2021   PLT 244.0 09/23/2021   Lab Results  Component Value Date   NA 137 09/23/2021   K 4.2 09/23/2021   CO2 24 09/23/2021   GLUCOSE 99 09/23/2021   BUN 11 09/23/2021   CREATININE 0.77 09/23/2021   BILITOT 0.4 09/23/2021   ALKPHOS 42 09/23/2021   AST 14 09/23/2021   ALT 12 09/23/2021   PROT 6.6 09/23/2021   ALBUMIN 4.1 09/23/2021   CALCIUM 8.7 09/23/2021   GFR 88.59 09/23/2021   Lab Results  Component Value Date   CHOL 186 09/23/2021   Lab Results  Component Value Date   HDL 38.50 (L) 09/23/2021   Lab Results  Component Value Date   LDLCALC 119 (H) 09/23/2021   Lab Results  Component Value Date   TRIG 143.0 09/23/2021   Lab Results  Component Value Date   CHOLHDL 5 09/23/2021   Lab Results  Component Value Date   HGBA1C 5.8 09/23/2021      Assessment & Plan:   Problem List Items Addressed This Visit   None Visit Diagnoses     Obesity, Class I, BMI 30-34.9    -  Primary   Relevant Medications   Dulaglutide 0.75 MG/0.5ML SOPN   Upper back pain       Relevant Medications   tiZANidine (ZANAFLEX) 4 MG tablet   diclofenac (VOLTAREN) 75 MG EC tablet       Meds ordered this encounter  Medications   tiZANidine (ZANAFLEX) 4 MG tablet    Sig: Take 1 tablet (4 mg total) by mouth every 8 (eight) hours as needed (headache).    Dispense:  30 tablet    Refill:  1   diclofenac (VOLTAREN) 75 MG EC tablet    Sig: Take 1 tablet (75 mg total) by mouth 2 (two) times daily.    Dispense:  30 tablet    Refill:  0   Dulaglutide 0.75 MG/0.5ML SOPN    Sig: Inject 0.5 mg into the skin once a week.    Dispense:  4 mL    Refill:  0    Order Specific Question:   Supervising Provider    Answer:   Neva Seat, JEFFREY R [[id number]]    Follow-up: Return in about 6 months (around 05/12/2022) for CPE and labs.   PLAN Reviewed  labs. No changes needed. Continue healthy lifestyle and recheck annually. Refill diclofenac and tizanidine.  Start dulaglutide for weight management and prediabetes. Can increase dose if tolerated well. Patient encouraged to call clinic with any questions, comments, or concerns.   Janeece Agee, NP

## 2021-11-11 NOTE — Patient Instructions (Addendum)
Ms. Latayna Ritchie to see you!  No concerns on labs - keep on keepin' on with the healthy diet and lifestyle.   We'll see about getting an injectable covered. I'll let you know how things go.  See you in 2023 for a physical and labs.  Thank you  Rich    If you have lab work done today you will be contacted with your lab results within the next 2 weeks.  If you have not heard from [place] then please contact us. The fastest way to get your results is to register for My Chart.   IF you received an x-ray today, you will receive an invoice from [facility]. Please contact [facility] at [phone] with questions or concerns regarding your invoice.   IF you received labwork today, you will receive an invoice from [facility]. Please contact [facility] at [phone] with questions or concerns regarding your invoice.   Our billing staff will not be able to assist you with questions regarding bills from these companies.  You will be contacted with the lab results as soon as they are available. The fastest way to get your results is to activate your My Chart account. Instructions are located on the last page of this paperwork. If you have not heard from [place] regarding the results in 2 weeks, please contact this office.

## 2021-11-12 ENCOUNTER — Telehealth: Payer: Self-pay | Admitting: Physician Assistant

## 2021-11-12 MED ORDER — NEBIVOLOL HCL 5 MG PO TABS: 5.0000 mg | ORAL_TABLET | Freq: Every day | ORAL | 0 refills | Status: DC

## 2021-11-12 NOTE — Telephone Encounter (Signed)
°*  STAT* If patient is at the pharmacy, call can be transferred to refill team.   1. Which medications need to be refilled? (please list name of each medication and dose if known) nebivolol (BYSTOLIC) 5 MG tablet  2. Which pharmacy/location (including street and city if local pharmacy) is medication to be sent to? [facility] - [place], [place] - [address]  3. Do they need a 30 day or 90 day supply? 90 day supply

## 2021-11-12 NOTE — Telephone Encounter (Signed)
Pt's medication was sent to pt's pharmacy as requested. Confirmation received.  °

## 2021-11-23 ENCOUNTER — Other Ambulatory Visit: Payer: Self-pay

## 2021-11-23 ENCOUNTER — Encounter: Payer: Self-pay | Admitting: Registered Nurse

## 2021-11-23 ENCOUNTER — Ambulatory Visit (INDEPENDENT_AMBULATORY_CARE_PROVIDER_SITE_OTHER): Admitting: Registered Nurse

## 2021-11-23 VITALS — BP 90/61 | HR 75 | Temp 98.3°F | Resp 18 | Ht 65.0 in | Wt 193.0 lb

## 2021-11-23 DIAGNOSIS — R209 Unspecified disturbances of skin sensation: Secondary | ICD-10-CM | POA: Diagnosis not present

## 2021-11-23 NOTE — Patient Instructions (Addendum)
Ms. Britiny Defrain news! No evidence of a clot.  Seems to be liquid pooling. Happens to the best of [place].  Let me know if you need anything!  Happy new year,  Luan Pulling     If you have lab work done today you will be contacted with your lab results within the next 2 weeks.  If you have not heard from [place] then please contact us. The fastest way to get your results is to register for My Chart.   IF you received an x-ray today, you will receive an invoice from [facility]. Please contact [facility] at [phone] with questions or concerns regarding your invoice.   IF you received labwork today, you will receive an invoice from [facility]. Please contact [facility] at [phone] with questions or concerns regarding your invoice.   Our billing staff will not be able to assist you with questions regarding bills from these companies.  You will be contacted with the lab results as soon as they are available. The fastest way to get your results is to activate your My Chart account. Instructions are located on the last page of this paperwork. If you have not heard from [place] regarding the results in 2 weeks, please contact this office.

## 2021-11-23 NOTE — Progress Notes (Signed)
Established Patient Office Visit  Subjective:  Patient ID: Cassandra Leach, female    DOB: Mar 28, 1971  Age: 51 y.o. MRN: [id number]  CC:  Chief Complaint  Patient presents with   Arm Problem    Patient states she is here because she would like her arm to be looked at because she had a vein sticking out.    HPI Cassandra Leach presents for vein issue  Notes yesterday had an antecubital vein bulging above skin level. Has not happened before. Notes no new activity, diet, or other lifestyle change yesterday - perhaps a little more sedentary than usual.  No pain. No symptoms distal or proximal to vein  No redness, no hx of dvt.  Past Medical History:  Diagnosis Date   Anxiety    Asthma    Depression    History of migraine    No issue in 2 years   IBS (irritable bowel syndrome)    Insomnia    Mild sleep apnea    Prediabetes    SVT (supraventricular tachycardia) (HCC)    Vitamin D deficiency     Past Surgical History:  Procedure Laterality Date   RADIOFREQUENCY ABLATION NERVES      Family History  Problem Relation Age of Onset   Hypertension Mother    Hyperlipidemia Mother    Suicidality Brother    Angina Paternal Grandmother     Social History   Socioeconomic History   Marital status: Married    Spouse name: Not on file   Number of children: Not on file   Years of education: Not on file   Highest education level: Not on file  Occupational History   Occupation: stay at home spouse  Tobacco Use   Smoking status: Never   Smokeless tobacco: Never   Tobacco comments:    Smoked when drinking in 75s.  Vaping Use   Vaping Use: Former   Quit date: 11/15/2018  Substance and Sexual Activity   Alcohol use: Yes    Comment: [facility]   Drug use: No   Sexual activity: Yes    Birth control/protection: Other-see comments    Comment: VAS.  Other Topics Concern   Not on file  Social History Narrative   Not on file   Social Determinants of Health   Financial Resource  Strain: Not on file  Food Insecurity: Not on file  Transportation Needs: Not on file  Physical Activity: Not on file  Stress: Not on file  Social Connections: Not on file  Intimate Partner Violence: Not on file    Outpatient Medications Prior to Visit  Medication Sig Dispense Refill   ARIPiprazole (ABILIFY) 10 MG tablet Take 10 mg by mouth daily.     buPROPion (WELLBUTRIN XL) 150 MG 24 hr tablet Take 150 mg by mouth at bedtime.     Cholecalciferol (VITAMIN D3) 5000 units CAPS Take 1 capsule by mouth daily.     diclofenac (VOLTAREN) 75 MG EC tablet Take 1 tablet (75 mg total) by mouth 2 (two) times daily. 30 tablet 0   meloxicam (MOBIC) 7.5 MG tablet Take 1 tablet (7.5 mg total) by mouth daily as needed for pain (headache). 30 tablet 1   mupirocin ointment (BACTROBAN) 2 % Apply 1 application topically 2 (two) times daily. 30 g 0   nebivolol (BYSTOLIC) 5 MG tablet Take 1 tablet (5 mg total) by mouth daily. Please keep upcoming appt in March 2023 with Cardiologist before anymore refills. Thank you Final Attempt 90  tablet 0   predniSONE (DELTASONE) 10 MG tablet 3 tabs x3 days and then 2 tabs x3 days and then 1 tab x3 days.  Take w/ food. 18 tablet 0   tiZANidine (ZANAFLEX) 4 MG tablet Take 1 tablet (4 mg total) by mouth every 8 (eight) hours as needed (headache). 30 tablet 1   traZODone (DESYREL) 50 MG tablet Take 100 mg by mouth at bedtime as needed.     TRINTELLIX 20 MG TABS tablet Take 20 mg by mouth daily.     Dulaglutide 0.75 MG/0.5ML SOPN Inject 0.5 mg into the skin once a week. (Patient not taking: Reported on 11/23/2021) 4 mL 0   No facility-administered medications prior to visit.    Allergies  Allergen Reactions   Penicillins Nausea And Vomiting   Sulfa Antibiotics Nausea And Vomiting   Neosporin [Bacitracin-Polymyxin B] Rash    ROS Review of Systems  Constitutional: Negative.   HENT: Negative.    Eyes: Negative.   Respiratory: Negative.    Cardiovascular: Negative.    Gastrointestinal: Negative.   Genitourinary: Negative.   Musculoskeletal: Negative.   Skin: Negative.   Neurological: Negative.   Psychiatric/Behavioral: Negative.       Objective:    Physical Exam Vitals and nursing note reviewed.  Constitutional:      General: She is not in acute distress.    Appearance: Normal appearance. She is normal weight. She is not ill-appearing, toxic-appearing or diaphoretic.  Cardiovascular:     Rate and Rhythm: Normal rate and regular rhythm.     Heart sounds: Normal heart sounds. No murmur heard.   No friction rub. No gallop.  Pulmonary:     Effort: Pulmonary effort is normal. No respiratory distress.     Breath sounds: Normal breath sounds. No stridor. No wheezing, rhonchi or rales.  Chest:     Chest wall: No tenderness.  Musculoskeletal:        General: No swelling, tenderness, deformity or signs of injury. Normal range of motion.     Right lower leg: No edema.     Left lower leg: No edema.  Skin:    General: Skin is warm and dry.     Capillary Refill: Capillary refill takes less than 2 seconds.     Coloration: Skin is not jaundiced or pale.     Findings: No bruising, erythema, lesion or rash.  Neurological:     General: No focal deficit present.     Mental Status: She is alert and oriented to person, place, and time. Mental status is at baseline.  Psychiatric:        Mood and Affect: Mood normal.        Behavior: Behavior normal.        Thought Content: Thought content normal.        Judgment: Judgment normal.    BP 90/61    Pulse 75    Temp 98.3 F (36.8 C) (Temporal)    Resp 18    Ht 5\' 5"  (1.651 m)    Wt 193 lb (87.5 kg)    SpO2 96%    BMI 32.12 kg/m  Wt Readings from Last 3 Encounters:  11/23/21 193 lb (87.5 kg)  11/11/21 196 lb (88.9 kg)  09/22/21 193 lb 6.4 oz (87.7 kg)     Health Maintenance Due  Topic Date Due   Pneumococcal Vaccine 54-52 Years old (1 - PCV) Never done   Zoster Vaccines- Shingrix (2 of 2) 10/09/2019    COVID-19  Vaccine (5 - Booster for Pfizer series) 05/04/2021    There are no preventive care reminders to display for this patient.  Lab Results  Component Value Date   TSH 2.75 09/23/2021   Lab Results  Component Value Date   WBC 6.2 09/23/2021   HGB 13.4 09/23/2021   HCT 39.6 09/23/2021   MCV 88.8 09/23/2021   PLT 244.0 09/23/2021   Lab Results  Component Value Date   NA 137 09/23/2021   K 4.2 09/23/2021   CO2 24 09/23/2021   GLUCOSE 99 09/23/2021   BUN 11 09/23/2021   CREATININE 0.77 09/23/2021   BILITOT 0.4 09/23/2021   ALKPHOS 42 09/23/2021   AST 14 09/23/2021   ALT 12 09/23/2021   PROT 6.6 09/23/2021   ALBUMIN 4.1 09/23/2021   CALCIUM 8.7 09/23/2021   GFR 88.59 09/23/2021   Lab Results  Component Value Date   CHOL 186 09/23/2021   Lab Results  Component Value Date   HDL 38.50 (L) 09/23/2021   Lab Results  Component Value Date   LDLCALC 119 (H) 09/23/2021   Lab Results  Component Value Date   TRIG 143.0 09/23/2021   Lab Results  Component Value Date   CHOLHDL 5 09/23/2021   Lab Results  Component Value Date   HGBA1C 5.8 09/23/2021      Assessment & Plan:   Problem List Items Addressed This Visit   None Visit Diagnoses     Abnormal arm sensation    -  Primary       No orders of the defined types were placed in this encounter.   Follow-up: No follow-ups on file.   PLAN Exam unremarkable Suspect pooling of fluid No evidence of DVT Patient encouraged to call clinic with any questions, comments, or concerns.  Janeece Ageeichard Yarielys Beed, NP

## 2021-11-24 ENCOUNTER — Ambulatory Visit: Admitting: Physician Assistant

## 2021-12-03 ENCOUNTER — Encounter: Payer: Self-pay | Admitting: Family Medicine

## 2021-12-03 ENCOUNTER — Telehealth (INDEPENDENT_AMBULATORY_CARE_PROVIDER_SITE_OTHER): Admitting: Family Medicine

## 2021-12-03 VITALS — Wt 191.8 lb

## 2021-12-03 DIAGNOSIS — U071 COVID-19: Secondary | ICD-10-CM | POA: Diagnosis not present

## 2021-12-03 MED ORDER — MOLNUPIRAVIR EUA 200MG CAPSULE: 4.0000 | ORAL_CAPSULE | Freq: Two times a day (BID) | ORAL | 0 refills | Status: AC

## 2021-12-03 MED ORDER — BENZONATATE 100 MG PO CAPS: ORAL_CAPSULE | ORAL | 0 refills | Status: DC

## 2021-12-03 NOTE — Progress Notes (Signed)
Virtual Visit via Video Note  I connected with Cassandra Leach  on 12/03/21 at  1:00 PM EST by a video enabled telemedicine application and verified that I am speaking with the correct person using two identifiers.  Location patient: [place] Location provider:work or home office Persons participating in the virtual visit: patient, provider  I discussed the limitations and requested verbal permission for telemedicine visit. The patient expressed understanding and agreed to proceed.   HPI:  Acute telemedicine visit for Covid19: -Onset: 3 days ago -Symptoms include: nasal congestion, cough, body aches, sore throat -Denies: fevers, CP, SOB, NVD -Has tried: delsym, zyrtec -Pertinent past medical history: see below, has not had covid in the past, obesity, asthma, prediabetes, SVT -GFR was 88 in november -Pertinent medication allergies: Allergies  Allergen Reactions   Penicillins Nausea And Vomiting   Sulfa Antibiotics Nausea And Vomiting   Neosporin [Bacitracin-Polymyxin B] Rash  -COVID-19 vaccine status: 2 doses and 3 booster Immunization History  Administered Date(s) Administered   Influenza,inj,Quad PF,6+ Mos 08/16/2017, 08/18/2018, 08/14/2019, 09/03/2020   Influenza-Unspecified 10/04/2021   PFIZER(Purple Top)SARS-COV-2 Vaccination 02/03/2020, 02/24/2020, 10/10/2020, 03/09/2021, 07/28/2021   Zoster Recombinat (Shingrix) 08/14/2019   Zoster, Live 10/16/2019     ROS: See pertinent positives and negatives per HPI.  Past Medical History:  Diagnosis Date   Anxiety    Asthma    Depression    History of migraine    No issue in 2 years   IBS (irritable bowel syndrome)    Insomnia    Mild sleep apnea    Prediabetes    SVT (supraventricular tachycardia) (HCC)    Vitamin D deficiency     Past Surgical History:  Procedure Laterality Date   RADIOFREQUENCY ABLATION NERVES       Current Outpatient Medications:    ARIPiprazole (ABILIFY) 10 MG tablet, Take 10 mg by mouth daily., Disp: ,  Rfl:    buPROPion (WELLBUTRIN XL) 150 MG 24 hr tablet, Take 150 mg by mouth at bedtime., Disp: , Rfl:    Cholecalciferol (VITAMIN D3) 5000 units CAPS, Take 1 capsule by mouth daily., Disp: , Rfl:    diclofenac (VOLTAREN) 75 MG EC tablet, Take 1 tablet (75 mg total) by mouth 2 (two) times daily., Disp: 30 tablet, Rfl: 0   meloxicam (MOBIC) 7.5 MG tablet, Take 1 tablet (7.5 mg total) by mouth daily as needed for pain (headache)., Disp: 30 tablet, Rfl: 1   nebivolol (BYSTOLIC) 5 MG tablet, Take 1 tablet (5 mg total) by mouth daily. Please keep upcoming appt in March 2023 with Cardiologist before anymore refills. Thank you Final Attempt, Disp: 90 tablet, Rfl: 0   tiZANidine (ZANAFLEX) 4 MG tablet, Take 1 tablet (4 mg total) by mouth every 8 (eight) hours as needed (headache)., Disp: 30 tablet, Rfl: 1   TRINTELLIX 20 MG TABS tablet, Take 20 mg by mouth daily., Disp: , Rfl:   EXAM:  VITALS per patient if applicable:  GENERAL: alert, oriented, appears well and in no acute distress  HEENT: atraumatic, conjunttiva clear, no obvious abnormalities on inspection of external nose and ears  NECK: normal movements of the head and neck  LUNGS: on inspection no signs of respiratory distress, breathing rate appears normal, no obvious gross SOB, gasping or wheezing  CV: no obvious cyanosis  MS: moves all visible extremities without noticeable abnormality  PSYCH/NEURO: pleasant and cooperative, no obvious depression or anxiety, speech and thought processing grossly intact  ASSESSMENT AND PLAN:  Discussed the following assessment and plan:  No  diagnosis found.   Discussed treatment options and risk of drug interactions, ideal treatment window, potential complications, isolation and precautions for COVID-19.  Discussed possibility of rebound with antivirals and the need to reisolate if it should occur for 5 days. Checked for/reviewed any labs done in the last 90 days with GFR listed in HPI if available.  After lengthy discussion, the patient opted for treatment with Legevrio due to being higher risk for complications of covid or severe disease and other factors. Discussed EUA status of this drug and the fact that there is preliminary limited knowledge of risks/interactions/side effects per EUA document vs possible benefits and precautions. This information was shared with patient during the visit and also was provided in patient instructions. Also, advised that patient discuss risks/interactions and use with pharmacist/treatment team as well. The patient did want a prescription for cough, Tessalon Rx sent.  Other symptomatic care measures summarized in patient instructions.  Advised to seek prompt virtual visit or in person care if worsening, new symptoms arise, or if is not improving with treatment as expected per our conversation of expected course. Discussed options for follow up care. Did let this patient know that I do telemedicine on Tuesdays and Thursdays for [facility] and those are the days I am logged into the system. Advised to schedule follow up visit with PCP, [facility] virtual visits or [facility] if any further questions or concerns to avoid delays in care.   I discussed the assessment and treatment plan with the patient. The patient was provided an opportunity to ask questions and all were answered. The patient agreed with the plan and demonstrated an understanding of the instructions.     Cassandra Koyanagi, DO

## 2021-12-03 NOTE — Patient Instructions (Signed)
HOME CARE TIPS:   -I sent the medication(s) we discussed to your pharmacy: Meds ordered this encounter  Medications   molnupiravir EUA (LAGEVRIO) 200 mg CAPS capsule    Sig: Take 4 capsules (800 mg total) by mouth 2 (two) times daily for 5 days.    Dispense:  40 capsule    Refill:  0   benzonatate (TESSALON PERLES) 100 MG capsule    Sig: 1-2 capsules up to twice daily as needed for cough    Dispense:  30 capsule    Refill:  0     -I sent in the [place] treatment or referral you requested per our discussion. Please see the information provided below and discuss further with the pharmacist/treatment team.   -there is a chance of rebound illness after finishing your treatment. If you become sick again please isolate for an additional 5 days, plus 5 more days of masking.   -can use tylenol if needed for fevers, aches and pains per instructions  -can use nasal saline a few times per day if you have nasal congestion  -stay hydrated, drink plenty of fluids and eat small healthy meals - avoid dairy  -can take 1000 IU (46mg) Vit D3 and 100-500 mg of Vit C daily per instructions  -stay home while sick, except to seek medical care. If you have COVID19, you will likely be contagious for 7-10 days. Flu or Influenza is likely contagious for about 7 days. Other respiratory viral infections remain contagious for 5-10+ days depending on the virus and many other factors. Wear a good mask that fits snugly (such as N95 or KN95) if around others to reduce the risk of transmission.  It was nice to meet you today, and I really hope you are feeling better soon. I help [facility] out with telemedicine visits on Tuesdays and Thursdays and am happy to help if you need a follow up virtual visit on those days. Otherwise, if you have any concerns or questions following this visit please schedule a follow up visit with your Primary Care doctor or seek care at a local urgent care clinic to avoid delays in care.     Seek in person care or schedule a follow up video visit promptly if your symptoms worsen, new concerns arise or you are not improving with treatment. Call 911 and/or seek emergency care if your symptoms are severe or life threatening.    Fact Sheet for Patients And Caregivers Emergency Use Authorization (EUA) Of LAGEVRIO (molnupiravir) capsules For Coronavirus Disease 2019 (COVID-19)  What is the most important information I should know about LAGEVRIO? LAGEVRIO may cause serious side effects, including: ? LAGEVRIO may cause harm to your unborn baby. It is not known if LAGEVRIO will harm your baby if you take LAGEVRIO during pregnancy. o LAGEVRIO is not recommended for use in pregnancy. o LAGEVRIO has not been studied in pregnancy. LAGEVRIO was studied in pregnant animals only. When LAGEVRIO was given to pregnant animals, LAGEVRIO caused harm to their unborn babies. o You and your healthcare provider may decide that you should take LAGEVRIO during pregnancy if there are no other COVID-19 treatment options approved or authorized by the FDA that are accessible or clinically appropriate for you. o If you and your healthcare provider decide that you should take LAGEVRIO during pregnancy, you and your healthcare provider should discuss the known and potential benefits and the potential risks of taking LAGEVRIO during pregnancy. For individuals who are able to become pregnant: ? You  should use a reliable method of birth control (contraception) consistently and correctly during treatment with LAGEVRIO and for 4 days after the last dose of LAGEVRIO. Talk to your healthcare provider about reliable birth control methods. ? Before starting treatment with [facility] your healthcare provider may do a pregnancy test to see if you are pregnant before starting treatment with LAGEVRIO. ? Tell your healthcare provider right away if you become pregnant or think you may be pregnant during treatment with  LAGEVRIO. Pregnancy Surveillance Program: ? There is a pregnancy surveillance program for individuals who take LAGEVRIO during pregnancy. The purpose of this program is to collect information about the health of you and your baby. Talk to your healthcare provider about how to take part in this program. ? If you take LAGEVRIO during pregnancy and you agree to participate in the pregnancy surveillance program and allow your healthcare provider to share your information with [place], then your healthcare provider will report your use of [place] during pregnancy to [place]. by calling [phone] or PeacefulBlog.es. For individuals who are sexually active with partners who are able to become pregnant: ? It is not known if LAGEVRIO can affect sperm. While the risk is regarded as low, animal studies to fully assess the potential for LAGEVRIO to affect the babies of males treated with LAGEVRIO have not been completed. A reliable method of birth control (contraception) should be used consistently and correctly during treatment with LAGEVRIO and for at least 3 months after the last dose. The risk to sperm beyond 3 months is not known. Studies to understand the risk to sperm beyond 3 months are ongoing. Talk to your healthcare provider about reliable birth control methods. Talk to your healthcare provider if you have questions or concerns about how LAGEVRIO may affect sperm. You are being given this fact sheet because your healthcare provider believes it is necessary to provide you with LAGEVRIO for the treatment of adults with mild-to-moderate coronavirus disease 2019 (COVID-19) with positive results of direct SARS-CoV-2 viral testing, and who are at high risk for progression to severe COVID-19 including hospitalization or death, and for whom other COVID-19 treatment options approved or authorized by the FDA are not accessible or clinically appropriate. The  U.S. Food and Drug Administration (FDA) has issued an Emergency Use Authorization (EUA) to make LAGEVRIO available during the COVID-19 pandemic (for more details about an EUA please see What is an Emergency Use Authorization? at the end of this document). LAGEVRIO is not an FDA-approved medicine in the [place]. Read this Fact Sheet for information about LAGEVRIO. Talk to your healthcare provider about your options if you have any questions. It is your choice to take LAGEVRIO.  What is COVID-19? COVID-19 is caused by a virus called a coronavirus. You can get COVID-19 through close contact with another person who has the virus. COVID-19 illnesses have ranged from very mild-to-severe, including illness resulting in death. While information so far suggests that most COVID-19 illness is mild, serious illness can happen and may cause some of your other medical conditions to become worse. Older people and people of all ages with severe, long lasting (chronic) medical conditions like heart disease, lung disease and diabetes, for example seem to be at higher risk of being hospitalized for COVID-19.  What is LAGEVRIO? LAGEVRIO is an investigational medicine used to treat mild-to-moderate COVID-19 in adults: ? with positive results of direct SARS-CoV-2 viral testing, and ? who are at high risk for  progression to severe COVID-19 including hospitalization or death, and for whom other COVID-19 treatment options approved or authorized by the FDA are not accessible or clinically appropriate. The FDA has authorized the emergency use of LAGEVRIO for the treatment of mild-tomoderate COVID-19 in adults under an EUA. For more information on EUA, see the What is an Emergency Use Authorization (EUA)? section at the end of this Fact Sheet. LAGEVRIO is not authorized: ? for use in people less than 2 years of age. ? for prevention of COVID-19. ? for people needing hospitalization for COVID-19. ? for  use for longer than 5 consecutive days.  What should I tell my healthcare provider before I take LAGEVRIO? Tell your healthcare provider if you: ? Have any allergies ? Are breastfeeding or plan to breastfeed ? Have any serious illnesses ? Are taking any medicines (prescription, over-the-counter, vitamins, or herbal products).  How do I take LAGEVRIO? ? Take LAGEVRIO exactly as your healthcare provider tells you to take it. ? Take 4 capsules of LAGEVRIO every 12 hours (for example, at 8 am and at 8 pm) ? Take LAGEVRIO for 5 days. It is important that you complete the full 5 days of treatment with LAGEVRIO. Do not stop taking LAGEVRIO before you complete the full 5 days of treatment, even if you feel better. ? Take LAGEVRIO with or without food. ? You should stay in isolation for as long as your healthcare provider tells you to. Talk to your healthcare provider if you are not sure about how to properly isolate while you have COVID-19. ? Swallow LAGEVRIO capsules whole. Do not open, break, or crush the capsules. If you cannot swallow capsules whole, tell your healthcare provider. ? What to do if you miss a dose: o If it has been less than 10 hours since the missed dose, take it as soon as you remember o If it has been more than 10 hours since the missed dose, skip the missed dose and take your dose at the next scheduled time. ? Do not double the dose of LAGEVRIO to make up for a missed dose.  What are the important possible side effects of LAGEVRIO? ? See, What is the most important information I should know about LAGEVRIO? ? Allergic Reactions. Allergic reactions can happen in people taking LAGEVRIO, even after only 1 dose. Stop taking LAGEVRIO and call your healthcare provider right away if you get any of the following symptoms of an allergic reaction: o hives o rapid heartbeat o trouble swallowing or breathing o swelling of the mouth, lips, or face o throat tightness o  hoarseness o skin rash The most common side effects of LAGEVRIO are: ? diarrhea ? nausea ? dizziness These are not all the possible side effects of LAGEVRIO. Not many people have taken LAGEVRIO. Serious and unexpected side effects may happen. This medicine is still being studied, so it is possible that all of the risks are not known at this time.  What other treatment choices are there?  Veklury (remdesivir) is FDA-approved as an intravenous (IV) infusion for the treatment of mildto-moderate RFFMB-84 in certain adults and children. Talk with your doctor to see if Marijean Heath is appropriate for you. Like LAGEVRIO, FDA may also allow for the emergency use of other medicines to treat people with COVID-19. Go to LacrosseProperties.si for more information. It is your choice to be treated or not to be treated with LAGEVRIO. Should you decide not to take it, it will not change your standard medical  care.  What if I am breastfeeding? Breastfeeding is not recommended during treatment with LAGEVRIO and for 4 days after the last dose of LAGEVRIO. If you are breastfeeding or plan to breastfeed, talk to your healthcare provider about your options and specific situation before taking LAGEVRIO.  How do I report side effects with LAGEVRIO? Contact your healthcare provider if you have any side effects that bother you or do not go away. Report side effects to FDA MedWatch at SmoothHits.hu or call 1-800-FDA-1088 (1- ([phone] 083-4709).  How should I store [place]? ? Store LAGEVRIO capsules at room temperature between 48F to 58F (20C to 25C). ? Keep LAGEVRIO and all medicines out of the reach of children and pets. How can I learn more about COVID-19? ? Ask your healthcare provider. ? Visit SeekRooms.co.uk ? Contact your local or state public health department. ? Call [place] at  [phone] (toll free in the [place] ? Visit www.molnupiravir.com  What Is an Emergency Use Authorization (EUA)? The [place] FDA has made [place] available under an emergency access mechanism called an Emergency Use Authorization (EUA) The EUA is supported by a Presenter, broadcasting Health and Human Service (HHS) declaration that circumstances exist to justify emergency use of drugs and biological products during the COVID-19 pandemic. LAGEVRIO for the treatment of mild-to-moderate COVID-19 in adults with positive results of direct SARS-CoV-2 viral testing, who are at high risk for progression to severe COVID-19, including hospitalization or death, and for whom alternative COVID-19 treatment options approved or authorized by FDA are not accessible or clinically appropriate, has not undergone the same type of review as an FDA-approved product. In issuing an EUA under the HUDJS-97 public health emergency, the FDA has determined, among other things, that based on the total amount of scientific evidence available including data from adequate and well-controlled clinical trials, if available, it is reasonable to believe that the product may be effective for diagnosing, treating, or preventing COVID-19, or a serious or life-threatening disease or condition caused by COVID-19; that the known and potential benefits of the product, when used to diagnose, treat, or prevent such disease or condition, outweigh the known and potential risks of such product; and that there are no adequate, approved, and available alternatives.  All of these criteria must be met to allow for the product to be used in the treatment of patients during the COVID-19 pandemic. The EUA for LAGEVRIO is in effect for the duration of the COVID-19 declaration justifying emergency use of LAGEVRIO, unless terminated or revoked (after which LAGEVRIO may no longer be used under the EUA). For patent information:  http://rogers.info/ Copyright  2021-2022 [place] [place], [place] and its affiliates. All rights reserved. [id number] Revised: March 2022

## 2021-12-21 ENCOUNTER — Ambulatory Visit (INDEPENDENT_AMBULATORY_CARE_PROVIDER_SITE_OTHER): Admitting: Registered Nurse

## 2021-12-21 ENCOUNTER — Encounter: Payer: Self-pay | Admitting: Registered Nurse

## 2021-12-21 ENCOUNTER — Other Ambulatory Visit: Payer: Self-pay

## 2021-12-21 VITALS — BP 96/70 | HR 71 | Temp 98.0°F | Resp 18 | Ht 65.0 in | Wt 192.2 lb

## 2021-12-21 DIAGNOSIS — J3489 Other specified disorders of nose and nasal sinuses: Secondary | ICD-10-CM | POA: Diagnosis not present

## 2021-12-21 DIAGNOSIS — N3281 Overactive bladder: Secondary | ICD-10-CM | POA: Diagnosis not present

## 2021-12-21 DIAGNOSIS — L989 Disorder of the skin and subcutaneous tissue, unspecified: Secondary | ICD-10-CM

## 2021-12-21 LAB — URINALYSIS, ROUTINE W REFLEX MICROSCOPIC
Bilirubin Urine: NEGATIVE
Ketones, ur: NEGATIVE
Leukocytes,Ua: NEGATIVE
Nitrite: NEGATIVE
Specific Gravity, Urine: 1.025 (ref 1.000–1.030)
Total Protein, Urine: NEGATIVE
Urine Glucose: NEGATIVE
Urobilinogen, UA: 0.2 (ref 0.0–1.0)
pH: 6 (ref 5.0–8.0)

## 2021-12-21 MED ORDER — MIRABEGRON ER 25 MG PO TB24: 25.0000 mg | ORAL_TABLET | Freq: Every day | ORAL | 0 refills | Status: DC

## 2021-12-21 MED ORDER — AZELASTINE HCL 0.1 % NA SOLN: 1.0000 | Freq: Two times a day (BID) | NASAL | 12 refills | Status: DC

## 2021-12-21 NOTE — Patient Instructions (Addendum)
Cassandra Leach -  Cassandra Leach to see you.  Start myrbetriq - I'll check in on you in a few weeks  I'll let you know how the urine sample turns out  Call if you need anything!  Thanks  Rich     If you have lab work done today you will be contacted with your lab results within the next 2 weeks.  If you have not heard from [place] then please contact us. The fastest way to get your results is to register for My Chart.   IF you received an x-ray today, you will receive an invoice from [facility]. Please contact [facility] at [phone] with questions or concerns regarding your invoice.   IF you received labwork today, you will receive an invoice from [facility]. Please contact [facility] at [phone] with questions or concerns regarding your invoice.   Our billing staff will not be able to assist you with questions regarding bills from these companies.  You will be contacted with the lab results as soon as they are available. The fastest way to get your results is to activate your My Chart account. Instructions are located on the last page of this paperwork. If you have not heard from [place] regarding the results in 2 weeks, please contact this office.

## 2021-12-21 NOTE — Progress Notes (Signed)
Established Patient Office Visit  Subjective:  Patient ID: Cassandra Leach, female    DOB: [birth year]  Age: 51 y.o. MRN: [id number]  CC:  Chief Complaint  Patient presents with   Urinary Frequency    Patient states she is here because she has some urinary frequency and saw a commercial about over active bladder and feels like that's her. Patient would like to know whats her next step.     HPI Cassandra Leach presents for OAB  Urinary frequency. Fam hx of early menopause, thinks she may starting this change with one missed menses No dysuria, urgency, flank pain, suprapubic pressure. Notes sensation of urinary retention -  Hx of htn but has been extremely well controlled Voids around 15-20 times in a 24 hour span Notes 3 instances of nocturia every night.  Notes skin lesion on L side of forehead  Steady for some time Has not seen dermatology in some time.  Would like referral  Ongoing nasal congestion and rhinorrhea following COVID Cannot take decongestants due to hx of SVT Would like nasal spray  Past Medical History:  Diagnosis Date   Anxiety    Asthma    Depression    History of migraine    No issue in 2 years   IBS (irritable bowel syndrome)    Insomnia    Mild sleep apnea    Prediabetes    SVT (supraventricular tachycardia) (HCC)    Vitamin D deficiency     Past Surgical History:  Procedure Laterality Date   RADIOFREQUENCY ABLATION NERVES      Family History  Problem Relation Age of Onset   Hypertension Mother    Hyperlipidemia Mother    Suicidality Brother    Angina Paternal Grandmother     Social History   Socioeconomic History   Marital status: Married    Spouse name: Not on file   Number of children: Not on file   Years of education: Not on file   Highest education level: Not on file  Occupational History   Occupation: stay at home spouse  Tobacco Use   Smoking status: Never   Smokeless tobacco: Never   Tobacco comments:    Smoked when  drinking in 41s.  Vaping Use   Vaping Use: Former   Quit date: 11/15/2018  Substance and Sexual Activity   Alcohol use: Yes    Comment: OCC   Drug use: No   Sexual activity: Yes    Birth control/protection: Other-see comments    Comment: VAS.  Other Topics Concern   Not on file  Social History Narrative   Not on file   Social Determinants of Health   Financial Resource Strain: Not on file  Food Insecurity: Not on file  Transportation Needs: Not on file  Physical Activity: Not on file  Stress: Not on file  Social Connections: Not on file  Intimate Partner Violence: Not on file    Outpatient Medications Prior to Visit  Medication Sig Dispense Refill   ARIPiprazole (ABILIFY) 10 MG tablet Take 10 mg by mouth daily.     benzonatate (TESSALON PERLES) 100 MG capsule 1-2 capsules up to twice daily as needed for cough 30 capsule 0   buPROPion (WELLBUTRIN XL) 150 MG 24 hr tablet Take 150 mg by mouth at bedtime.     Cholecalciferol (VITAMIN D3) 5000 units CAPS Take 1 capsule by mouth daily.     diclofenac (VOLTAREN) 75 MG EC tablet Take 1 tablet (75 mg total)  by mouth 2 (two) times daily. 30 tablet 0   meloxicam (MOBIC) 7.5 MG tablet Take 1 tablet (7.5 mg total) by mouth daily as needed for pain (headache). 30 tablet 1   nebivolol (BYSTOLIC) 5 MG tablet Take 1 tablet (5 mg total) by mouth daily. Please keep upcoming appt in March 2023 with Cardiologist before anymore refills. Thank you Final Attempt 90 tablet 0   tiZANidine (ZANAFLEX) 4 MG tablet Take 1 tablet (4 mg total) by mouth every 8 (eight) hours as needed (headache). 30 tablet 1   TRINTELLIX 20 MG TABS tablet Take 20 mg by mouth daily.     No facility-administered medications prior to visit.    Allergies  Allergen Reactions   Penicillins Nausea And Vomiting   Sulfa Antibiotics Nausea And Vomiting   Neosporin [Bacitracin-Polymyxin B] Rash    ROS Review of Systems  Constitutional: Negative.   HENT: Negative.    Eyes:  Negative.   Respiratory: Negative.    Cardiovascular: Negative.   Gastrointestinal: Negative.   Genitourinary: Negative.   Musculoskeletal: Negative.   Skin: Negative.   Neurological: Negative.   Psychiatric/Behavioral: Negative.    All other systems reviewed and are negative.    Objective:    Physical Exam Vitals and nursing note reviewed.  Constitutional:      General: She is not in acute distress.    Appearance: Normal appearance. She is normal weight. She is not ill-appearing, toxic-appearing or diaphoretic.  Cardiovascular:     Rate and Rhythm: Normal rate and regular rhythm.     Heart sounds: Normal heart sounds. No murmur heard.   No friction rub. No gallop.  Pulmonary:     Effort: Pulmonary effort is normal. No respiratory distress.     Breath sounds: Normal breath sounds. No stridor. No wheezing, rhonchi or rales.  Chest:     Chest wall: No tenderness.  Skin:    General: Skin is warm and dry.  Neurological:     General: No focal deficit present.     Mental Status: She is alert and oriented to person, place, and time. Mental status is at baseline.  Psychiatric:        Mood and Affect: Mood normal.        Behavior: Behavior normal.        Thought Content: Thought content normal.        Judgment: Judgment normal.    BP 96/70    Pulse 71    Temp 98 F (36.7 C) (Temporal)    Resp 18    Ht 5\' 5"  (1.651 m)    Wt 192 lb 3.2 oz (87.2 kg)    SpO2 99%    BMI 31.98 kg/m  Wt Readings from Last 3 Encounters:  12/21/21 192 lb 3.2 oz (87.2 kg)  12/03/21 191 lb 12.8 oz (87 kg)  11/23/21 193 lb (87.5 kg)     Health Maintenance Due  Topic Date Due   Zoster Vaccines- Shingrix (2 of 2) 10/09/2019    There are no preventive care reminders to display for this patient.  Lab Results  Component Value Date   TSH 2.75 09/23/2021   Lab Results  Component Value Date   WBC 6.2 09/23/2021   HGB 13.4 09/23/2021   HCT 39.6 09/23/2021   MCV 88.8 09/23/2021   PLT 244.0  09/23/2021   Lab Results  Component Value Date   NA 137 09/23/2021   K 4.2 09/23/2021   CO2 24 09/23/2021   GLUCOSE  99 09/23/2021   BUN 11 09/23/2021   CREATININE 0.77 09/23/2021   BILITOT 0.4 09/23/2021   ALKPHOS 42 09/23/2021   AST 14 09/23/2021   ALT 12 09/23/2021   PROT 6.6 09/23/2021   ALBUMIN 4.1 09/23/2021   CALCIUM 8.7 09/23/2021   GFR 88.59 09/23/2021   Lab Results  Component Value Date   CHOL 186 09/23/2021   Lab Results  Component Value Date   HDL 38.50 (L) 09/23/2021   Lab Results  Component Value Date   LDLCALC 119 (H) 09/23/2021   Lab Results  Component Value Date   TRIG 143.0 09/23/2021   Lab Results  Component Value Date   CHOLHDL 5 09/23/2021   Lab Results  Component Value Date   HGBA1C 5.8 09/23/2021      Assessment & Plan:   Problem List Items Addressed This Visit   None Visit Diagnoses     OAB (overactive bladder)    -  Primary   Relevant Medications   mirabegron ER (MYRBETRIQ) 25 MG TB24 tablet   Other Relevant Orders   Urinalysis, Routine w reflex microscopic   Rhinorrhea       Relevant Medications   azelastine (ASTELIN) 0.1 % nasal spray   Skin lesion       Relevant Orders   Ambulatory referral to Dermatology       Meds ordered this encounter  Medications   mirabegron ER (MYRBETRIQ) 25 MG TB24 tablet    Sig: Take 1 tablet (25 mg total) by mouth daily.    Dispense:  90 tablet    Refill:  0    Order Specific Question:   Supervising Provider    Answer:   Neva Seat, JEFFREY R [[id number]]   azelastine (ASTELIN) 0.1 % nasal spray    Sig: Place 1 spray into both nostrils 2 (two) times daily. Use in each nostril as directed    Dispense:  30 mL    Refill:  12    Order Specific Question:   Supervising Provider    Answer:   Neva Seat, JEFFREY R [[id number]]    Follow-up: No follow-ups on file.   PLAN Urinalysis with reflex to micro to check for evidence of UTI. Refer to derm Start myrbetriq daily. Med check in 4-5 weeks.  Azelastine  for rhinorrhea. Patient encouraged to call clinic with any questions, comments, or concerns.  Janeece Agee, NP

## 2021-12-22 NOTE — Telephone Encounter (Signed)
Patient is giving an Financial planner.

## 2021-12-23 NOTE — Telephone Encounter (Signed)
I have a question about a drug for overactive bladder called oxybutynin (T[employer]are will cover this drug).  Do you think this would be a good option instead of the myrbetriq?   Please advise.   Thank you, Cassandra Leach

## 2021-12-28 ENCOUNTER — Other Ambulatory Visit: Payer: Self-pay | Admitting: Registered Nurse

## 2021-12-28 DIAGNOSIS — N3281 Overactive bladder: Secondary | ICD-10-CM

## 2021-12-28 MED ORDER — OXYBUTYNIN CHLORIDE ER 5 MG PO TB24: 5.0000 mg | ORAL_TABLET | Freq: Every day | ORAL | 0 refills | Status: DC

## 2022-01-04 ENCOUNTER — Other Ambulatory Visit: Payer: Self-pay | Admitting: Registered Nurse

## 2022-01-04 NOTE — Telephone Encounter (Signed)
Patient is giving an update on the medication that's she was taking and also wanted to try to switch to Myrbetriq I will do the PA for this patient today    Hi Cassandra Leach, You were so right the oxybutynin made me feel terrible! It blurred my vision and made it hard to urinate.  I also experienced dry eyes and mouth.  I think it even made my depression worse.  Today is the last day I took it.  I'll switch back to [employer].  Please still work on getting the [employer] authorized.  It costs $400 out of pocket.

## 2022-01-27 ENCOUNTER — Encounter: Payer: Self-pay | Admitting: Registered Nurse

## 2022-01-28 ENCOUNTER — Other Ambulatory Visit: Payer: Self-pay | Admitting: Registered Nurse

## 2022-02-02 ENCOUNTER — Ambulatory Visit: Admitting: Physician Assistant

## 2022-02-11 ENCOUNTER — Ambulatory Visit: Admitting: Family

## 2022-02-11 NOTE — Progress Notes (Signed)
?Cardiology Office Note:   ? ?Date:  02/12/2022  ? ?ID:  [name], DOB [birth year], MRN [id number] ? ?PCP:  Janeece Agee, NP  ?[facility] Cardiologist:  Tobias Alexander, MD  >>Dr. Shari Prows ?[facility] Electrophysiologist:  None  ? ?Chief Complaint:  follow up  ? ?History of Present Illness:   ? ?Cassandra Leach is a 51 y.o. female with a hx of SVT, asthma and mild sleep apnea seen for follow up.  ? ?Hx of SVT. Did not tolerated Toprol. On Bystolic. 2D echo in 2017 [facility] cardiology normal LVEF 60 to 65% with mild MR. ?Last seen 10/2020. ? ?Here today for follow up. Reports palpitations has subsided on Bystolic 5mg . The patient denies nausea, vomiting, fever, chest pain, palpitations, shortness of breath, orthopnea, PND, dizziness, syncope, cough, congestion, abdominal pain, hematochezia, melena, lower extremity edema.  ? ? ?Past Medical History:  ?Diagnosis Date  ? Anxiety   ? Asthma   ? Depression   ? History of migraine   ? No issue in 2 years  ? IBS (irritable bowel syndrome)   ? Insomnia   ? Mild sleep apnea   ? Prediabetes   ? SVT (supraventricular tachycardia) (HCC)   ? Vitamin D deficiency   ? ? ?Past Surgical History:  ?Procedure Laterality Date  ? RADIOFREQUENCY ABLATION NERVES    ? ? ?Current Medications: ?Current Meds  ?Medication Sig  ? ARIPiprazole (ABILIFY) 10 MG tablet Take 10 mg by mouth daily.  ? azelastine (ASTELIN) 0.1 % nasal spray Place 1 spray into both nostrils 2 (two) times daily. Use in each nostril as directed  ? buPROPion (WELLBUTRIN XL) 150 MG 24 hr tablet Take 150 mg by mouth at bedtime.  ? Cholecalciferol (VITAMIN D3) 5000 units CAPS Take 1 capsule by mouth daily.  ? diclofenac (VOLTAREN) 75 MG EC tablet Take 1 tablet (75 mg total) by mouth 2 (two) times daily.  ? meloxicam (MOBIC) 7.5 MG tablet Take 1 tablet (7.5 mg total) by mouth daily as needed for pain (headache).  ? mirabegron ER (MYRBETRIQ) 25 MG TB24 tablet Take 1 tablet (25 mg total) by mouth daily.  ? nebivolol  (BYSTOLIC) 5 MG tablet Take 1 tablet (5 mg total) by mouth daily. Please keep upcoming appt in March 2023 with Cardiologist before anymore refills. Thank you Final Attempt  ? tiZANidine (ZANAFLEX) 4 MG tablet Take 1 tablet (4 mg total) by mouth every 8 (eight) hours as needed (headache).  ? TRINTELLIX 20 MG TABS tablet Take 20 mg by mouth daily.  ?  ? ?Allergies:   Penicillins, Sulfa antibiotics, and Neosporin [bacitracin-polymyxin b]  ? ?Social History  ? ?Socioeconomic History  ? Marital status: Married  ?  Spouse name: Not on file  ? Number of children: Not on file  ? Years of education: Not on file  ? Highest education level: Not on file  ?Occupational History  ? Occupation: stay at home spouse  ?Tobacco Use  ? Smoking status: Never  ? Smokeless tobacco: Never  ? Tobacco comments:  ?  Smoked when drinking in 20s.  ?Vaping Use  ? Vaping Use: Former  ? Quit date: 11/15/2018  ?Substance and Sexual Activity  ? Alcohol use: Yes  ?  Comment: OCC  ? Drug use: No  ? Sexual activity: Yes  ?  Birth control/protection: Other-see comments  ?  Comment: VAS.  ?Other Topics Concern  ? Not on file  ?Social History Narrative  ? Not on file  ? ?  Social Determinants of Health  ? ?Financial Resource Strain: Not on file  ?Food Insecurity: Not on file  ?Transportation Needs: Not on file  ?Physical Activity: Not on file  ?Stress: Not on file  ?Social Connections: Not on file  ?  ? ?Family History: ?The patient's family history includes Angina in her paternal grandmother; Hyperlipidemia in her mother; Hypertension in her mother; Suicidality in her brother.   ? ?ROS:   ?Please see the history of present illness.    ?All other systems reviewed and are negative.  ? ?EKGs/Labs/Other Studies Reviewed:   ? ?The following studies were reviewed today: ?As above  ? ?EKG:  EKG is  ordered today.  The ekg ordered today demonstrates NSR ? ?Recent Labs: ?09/23/2021: ALT 12; BUN 11; Creatinine, Ser 0.77; Hemoglobin 13.4; Platelets 244.0; Potassium 4.2;  Sodium 137; TSH 2.75  ?Recent Lipid Panel ?   ?Component Value Date/Time  ? CHOL 186 09/23/2021 0826  ? CHOL 238 (H) 05/13/2020 1440  ? TRIG 143.0 09/23/2021 0826  ? HDL 38.50 (L) 09/23/2021 0826  ? HDL 34 (L) 05/13/2020 1440  ? CHOLHDL 5 09/23/2021 0826  ? VLDL 28.6 09/23/2021 0826  ? LDLCALC 119 (H) 09/23/2021 0938  ? LDLCALC 154 (H) 05/13/2020 1440  ? ? ?Physical Exam:   ? ?VS:  BP 112/78   Pulse 68   Ht 5\' 5"  (1.651 m)   Wt 200 lb (90.7 kg)   SpO2 96%   BMI 33.28 kg/m?    ? ?Wt Readings from Last 3 Encounters:  ?02/12/22 200 lb (90.7 kg)  ?12/21/21 192 lb 3.2 oz (87.2 kg)  ?12/03/21 191 lb 12.8 oz (87 kg)  ?  ? ?GEN:  Well nourished, well developed in no acute distress ?HEENT: Normal ?NECK: No JVD; No carotid bruits ?LYMPHATICS: No lymphadenopathy ?CARDIAC: RRR, no murmurs, rubs, gallops ?RESPIRATORY:  Clear to auscultation without rales, wheezing or rhonchi  ?ABDOMEN: Soft, non-tender, non-distended ?MUSCULOSKELETAL:  No edema; No deformity  ?SKIN: Warm and dry ?NEUROLOGIC:  Alert and oriented x 3 ?PSYCHIATRIC:  Normal affect  ? ?ASSESSMENT AND PLAN:  ? ? ?SVT ?No reoccurrence. Continue bystolic.  ? ?2. OSA ?- Did not tolerated CPAP. Working on wight loss.  ? ? ?3. Elevated lipids ?-09/23/2021: Cholesterol 186; HDL 38.50; LDL Cholesterol 119; Triglycerides 143.0; VLDL 28.6  ?- Managed by PCP. Working on diet and exercise.  ? ?She will established care with Dr. 13/07/2021.  ? ? ?Medication Adjustments/Labs and Tests Ordered: ?Current medicines are reviewed at length with the patient today.  Concerns regarding medicines are outlined above.  ?No orders of the defined types were placed in this encounter. ? ?No orders of the defined types were placed in this encounter. ? ? ?There are no Patient Instructions on file for this visit.  ? ?Signed, ?Shari Prows, Manson Passey  ?02/12/2022 11:52 AM    ?[facility] ?

## 2022-02-12 ENCOUNTER — Ambulatory Visit (INDEPENDENT_AMBULATORY_CARE_PROVIDER_SITE_OTHER): Admitting: Physician Assistant

## 2022-02-12 ENCOUNTER — Encounter: Payer: Self-pay | Admitting: Physician Assistant

## 2022-02-12 ENCOUNTER — Ambulatory Visit: Admitting: Physician Assistant

## 2022-02-12 VITALS — BP 112/78 | HR 68 | Ht 65.0 in | Wt 200.0 lb

## 2022-02-12 DIAGNOSIS — E785 Hyperlipidemia, unspecified: Secondary | ICD-10-CM

## 2022-02-12 DIAGNOSIS — G4733 Obstructive sleep apnea (adult) (pediatric): Secondary | ICD-10-CM

## 2022-02-12 DIAGNOSIS — I471 Supraventricular tachycardia, unspecified: Secondary | ICD-10-CM

## 2022-02-12 MED ORDER — NEBIVOLOL HCL 5 MG PO TABS: 5.0000 mg | ORAL_TABLET | Freq: Every day | ORAL | 3 refills | Status: DC

## 2022-02-12 NOTE — Patient Instructions (Signed)
Medication Instructions:  ?Your physician recommends that you continue on your current medications as directed. Please refer to the Current Medication list given to you today. ? ?*If you need a refill on your cardiac medications before your next appointment, please call your pharmacy* ? ?Lab Work: ?NONE ?If you have labs (blood work) drawn today and your tests are completely normal, you will receive your results only by: ?MyChart Message (if you have MyChart) OR ?A paper copy in the mail ?If you have any lab test that is abnormal or we need to change your treatment, we will call you to review the results. ? ? ?Testing/Procedures: ?NONE ? ?Follow-Up: ?At [facility], you and your health needs are our priority.  As part of our continuing mission to provide you with exceptional heart care, we have created designated Provider Care Teams.  These Care Teams include your primary Cardiologist (physician) and Advanced Practice Providers (APPs -  Physician Assistants and Nurse Practitioners) who all work together to provide you with the care you need, when you need it. ? ?Your next appointment:   ?1 year(s) ? ?The format for your next appointment:   ?In Person ? ?Provider:   ?Meriam Sprague, MD ?

## 2022-02-17 ENCOUNTER — Encounter: Payer: Self-pay | Admitting: Registered Nurse

## 2022-02-17 ENCOUNTER — Telehealth: Payer: Self-pay

## 2022-02-17 NOTE — Telephone Encounter (Signed)
Patient has been scheduled

## 2022-02-17 NOTE — Telephone Encounter (Signed)
Fine by me! She's very pleasant.  ? ?Thanks, ? ?Rich

## 2022-02-17 NOTE — Telephone Encounter (Signed)
Patient would like to transfer to [facility] from Janeece Agee. Patient states she would like to have a female provider. Please advise.  ?

## 2022-03-05 ENCOUNTER — Telehealth: Payer: Self-pay

## 2022-03-05 NOTE — Telephone Encounter (Signed)
Received a call from [place] at [facility]. Mitzi Davenport called about the prescription mirabegron ER (MYRBETRIQ) 25 MG TB24 tablet, there was a PA submitted in the past but due to lack of info they denied the prescription. Wanting to know if a PA can be re submitted with more information.  ?

## 2022-03-16 NOTE — Telephone Encounter (Signed)
This patient Paperwork for this medication is in the sign folder in the back. ?

## 2022-03-18 ENCOUNTER — Encounter: Admitting: Family Medicine

## 2022-03-22 ENCOUNTER — Other Ambulatory Visit: Payer: Self-pay

## 2022-03-22 ENCOUNTER — Encounter: Payer: Self-pay | Admitting: Registered Nurse

## 2022-03-22 ENCOUNTER — Ambulatory Visit (INDEPENDENT_AMBULATORY_CARE_PROVIDER_SITE_OTHER): Admitting: Registered Nurse

## 2022-03-22 VITALS — BP 118/68 | HR 62 | Temp 98.3°F | Resp 18 | Ht 65.0 in | Wt 196.4 lb

## 2022-03-22 DIAGNOSIS — M545 Low back pain, unspecified: Secondary | ICD-10-CM | POA: Diagnosis not present

## 2022-03-22 LAB — POCT URINALYSIS DIP (MANUAL ENTRY)
Bilirubin, UA: NEGATIVE
Blood, UA: NEGATIVE
Glucose, UA: NEGATIVE mg/dL
Ketones, POC UA: NEGATIVE mg/dL
Nitrite, UA: NEGATIVE
Protein Ur, POC: NEGATIVE mg/dL
Spec Grav, UA: <=1.005 — AB (ref 1.010–1.025)
Urobilinogen, UA: 0.2 E.U./dL
pH, UA: 6 (ref 5.0–8.0)

## 2022-03-22 MED ORDER — CYCLOBENZAPRINE HCL 10 MG PO TABS: 10.0000 mg | ORAL_TABLET | Freq: Three times a day (TID) | ORAL | 0 refills | Status: DC | PRN

## 2022-03-22 MED ORDER — DICLOFENAC SODIUM 75 MG PO TBEC: 75.0000 mg | DELAYED_RELEASE_TABLET | Freq: Two times a day (BID) | ORAL | 0 refills | Status: DC

## 2022-03-22 MED ORDER — METHYLPREDNISOLONE ACETATE 80 MG/ML IJ SUSP
80.0000 mg | Freq: Once | INTRAMUSCULAR | Status: AC
  Administered 2022-03-22: 80 mg via INTRAMUSCULAR

## 2022-03-22 NOTE — Progress Notes (Signed)
? ?Acute Office Visit ? ?Subjective:  ? ? Patient ID: Cassandra Leach, female    DOB: 1971-10-11, 51 y.o.   MRN: [id number] ? ?Chief Complaint  ?Patient presents with  ? Back Pain  ?  Patient states she has been having some back pain since Thursday. Patient states it is lower pain and mostly hurt when moving ?  ? ? ?HPI ?Patient is in today for back pain ? ?Onset Thursday ?Worse with movement ?No instigating event or trauma. ?No incontinence ?Has taken diclofenac and tizanidine without relief.  ? ?Outpatient Medications Prior to Visit  ?Medication Sig Dispense Refill  ? ARIPiprazole (ABILIFY) 10 MG tablet Take 10 mg by mouth daily.    ? azelastine (ASTELIN) 0.1 % nasal spray Place 1 spray into both nostrils 2 (two) times daily. Use in each nostril as directed 30 mL 12  ? buPROPion (WELLBUTRIN XL) 150 MG 24 hr tablet Take 150 mg by mouth at bedtime.    ? Cholecalciferol (VITAMIN D3) 5000 units CAPS Take 1 capsule by mouth daily.    ? meloxicam (MOBIC) 7.5 MG tablet Take 1 tablet (7.5 mg total) by mouth daily as needed for pain (headache). 30 tablet 1  ? mirabegron ER (MYRBETRIQ) 25 MG TB24 tablet Take 1 tablet (25 mg total) by mouth daily. 90 tablet 0  ? nebivolol (BYSTOLIC) 5 MG tablet Take 1 tablet (5 mg total) by mouth daily. Please keep upcoming appt in March 2023 with Cardiologist before anymore refills. Thank you Final Attempt 90 tablet 3  ? TRINTELLIX 20 MG TABS tablet Take 20 mg by mouth daily.    ? diclofenac (VOLTAREN) 75 MG EC tablet Take 1 tablet (75 mg total) by mouth 2 (two) times daily. 30 tablet 0  ? tiZANidine (ZANAFLEX) 4 MG tablet Take 1 tablet (4 mg total) by mouth every 8 (eight) hours as needed (headache). 30 tablet 1  ? benzonatate (TESSALON PERLES) 100 MG capsule 1-2 capsules up to twice daily as needed for cough (Patient not taking: Reported on 02/12/2022) 30 capsule 0  ? ?No facility-administered medications prior to visit.  ? ? ?Review of Systems  ?Constitutional: Negative.   ?HENT: Negative.     ?Eyes: Negative.   ?Respiratory: Negative.    ?Cardiovascular: Negative.   ?Gastrointestinal: Negative.   ?Genitourinary: Negative.   ?Musculoskeletal:  Positive for back pain. Negative for arthralgias, gait problem, joint swelling, myalgias, neck pain and neck stiffness.  ?Skin: Negative.   ?Neurological: Negative.   ?Psychiatric/Behavioral: Negative.    ?All other systems reviewed and are negative. ? ?   ?Objective:  ?  ?BP 118/68   Pulse 62   Temp 98.3 ?F (36.8 ?C) (Temporal)   Resp 18   Ht 5\' 5"  (1.651 m)   Wt 196 lb 6.4 oz (89.1 kg)   SpO2 99%   BMI 32.68 kg/m?  ?Physical Exam ?Vitals and nursing note reviewed.  ?Constitutional:   ?   General: She is not in acute distress. ?   Appearance: Normal appearance. She is normal weight. She is not ill-appearing, toxic-appearing or diaphoretic.  ?Cardiovascular:  ?   Rate and Rhythm: Normal rate and regular rhythm.  ?   Heart sounds: Normal heart sounds. No murmur heard. ?  No friction rub. No gallop.  ?Pulmonary:  ?   Effort: Pulmonary effort is normal. No respiratory distress.  ?   Breath sounds: Normal breath sounds. No stridor. No wheezing, rhonchi or rales.  ?Chest:  ?   Chest wall:  No tenderness.  ?Musculoskeletal:     ?   General: Tenderness (lower right back) present. No swelling, deformity or signs of injury. Normal range of motion.  ?   Right lower leg: No edema.  ?   Left lower leg: No edema.  ?Skin: ?   General: Skin is warm and dry.  ?Neurological:  ?   General: No focal deficit present.  ?   Mental Status: She is alert and oriented to person, place, and time. Mental status is at baseline.  ?Psychiatric:     ?   Mood and Affect: Mood normal.     ?   Behavior: Behavior normal.     ?   Thought Content: Thought content normal.     ?   Judgment: Judgment normal.  ? ? ?Results for orders placed or performed in visit on 03/22/22  ?POCT urinalysis dipstick  ?Result Value Ref Range  ? Color, UA colorless (A) yellow  ? Clarity, UA clear clear  ? Glucose, UA  negative negative mg/dL  ? Bilirubin, UA negative negative  ? Ketones, POC UA negative negative mg/dL  ? Spec Grav, UA <=1.005 (A) 1.010 - 1.025  ? Blood, UA negative negative  ? pH, UA 6.0 5.0 - 8.0  ? Protein Ur, POC negative negative mg/dL  ? Urobilinogen, UA 0.2 0.2 or 1.0 E.U./dL  ? Nitrite, UA Negative Negative  ? Leukocytes, UA Trace (A) Negative  ? ? ? ?   ?Assessment & Plan:  ?1. Low back pain, unspecified back pain laterality, unspecified chronicity, unspecified whether sciatica present ?- POCT urinalysis dipstick ? ?2. Acute right-sided low back pain without sciatica ?- cyclobenzaprine (FLEXERIL) 10 MG tablet; Take 1 tablet (10 mg total) by mouth 3 (three) times daily as needed for muscle spasms.  Dispense: 30 tablet; Refill: 0 ?- diclofenac (VOLTAREN) 75 MG EC tablet; Take 1 tablet (75 mg total) by mouth 2 (two) times daily.  Dispense: 30 tablet; Refill: 0 ?- methylPREDNISolone acetate (DEPO-MEDROL) injection 80 mg ? ? ? ?Meds ordered this encounter  ?Medications  ? cyclobenzaprine (FLEXERIL) 10 MG tablet  ?  Sig: Take 1 tablet (10 mg total) by mouth 3 (three) times daily as needed for muscle spasms.  ?  Dispense:  30 tablet  ?  Refill:  0  ?  Order Specific Question:   Supervising Provider  ?  Answer:   Neva Seat, JEFFREY R [[id number]]  ? diclofenac (VOLTAREN) 75 MG EC tablet  ?  Sig: Take 1 tablet (75 mg total) by mouth 2 (two) times daily.  ?  Dispense:  30 tablet  ?  Refill:  0  ?  Order Specific Question:   Supervising Provider  ?  Answer:   Neva Seat, JEFFREY R [[id number]]  ? methylPREDNISolone acetate (DEPO-MEDROL) injection 80 mg  ? ? ?Return if symptoms worsen or fail to improve. ? ?PLAN ?POCT UA does not show evidence of UTI ?Lower back muscle strain suspected. Will give depo medrol inj, flexeril, diclofenac ?Return if worsening or failing to improve ?Patient encouraged to call clinic with any questions, comments, or concerns. ? ?Janeece Agee, NP ?

## 2022-03-22 NOTE — Patient Instructions (Addendum)
[name] - ? ?Great to see you! ? ?Call if not improving within 48 hours. ? ?Stretch when you can, especially if we get the pain under control ? ?Thank you, ? ?Rich  ? ? ? ?If you have lab work done today you will be contacted with your lab results within the next 2 weeks.  If you have not heard from [place] then please contact us. The fastest way to get your results is to register for My Chart. ? ? ?IF you received an x-ray today, you will receive an invoice from [facility]. Please contact [facility] at [phone] with questions or concerns regarding your invoice.  ? ?IF you received labwork today, you will receive an invoice from [facility]. Please contact [facility] at [phone] with questions or concerns regarding your invoice.  ? ?Our billing staff will not be able to assist you with questions regarding bills from these companies. ? ?You will be contacted with the lab results as soon as they are available. The fastest way to get your results is to activate your My Chart account. Instructions are located on the last page of this paperwork. If you have not heard from [place] regarding the results in 2 weeks, please contact this office. ?  ? ? ?

## 2022-04-05 ENCOUNTER — Encounter: Payer: Self-pay | Admitting: Family Medicine

## 2022-04-05 ENCOUNTER — Ambulatory Visit (INDEPENDENT_AMBULATORY_CARE_PROVIDER_SITE_OTHER): Admitting: Family Medicine

## 2022-04-05 VITALS — BP 120/78 | HR 77 | Temp 97.7°F | Ht 65.0 in | Wt 194.4 lb

## 2022-04-05 DIAGNOSIS — M545 Low back pain, unspecified: Secondary | ICD-10-CM

## 2022-04-05 DIAGNOSIS — F320 Major depressive disorder, single episode, mild: Secondary | ICD-10-CM

## 2022-04-05 DIAGNOSIS — F411 Generalized anxiety disorder: Secondary | ICD-10-CM | POA: Diagnosis not present

## 2022-04-05 DIAGNOSIS — R7303 Prediabetes: Secondary | ICD-10-CM

## 2022-04-05 DIAGNOSIS — I471 Supraventricular tachycardia: Secondary | ICD-10-CM | POA: Diagnosis not present

## 2022-04-05 MED ORDER — DICLOFENAC SODIUM 75 MG PO TBEC: 75.0000 mg | DELAYED_RELEASE_TABLET | Freq: Two times a day (BID) | ORAL | 3 refills | Status: DC

## 2022-04-05 MED ORDER — CYCLOBENZAPRINE HCL 10 MG PO TABS: 10.0000 mg | ORAL_TABLET | Freq: Three times a day (TID) | ORAL | 1 refills | Status: DC | PRN

## 2022-04-05 NOTE — Patient Instructions (Signed)
It was very nice to see you today!  Keep up great work on diet/exercise   PLEASE NOTE:  If you had any lab tests please let us know if you have not heard back within a few days. You may see your results on MyChart before we have a chance to review them but we will give you a call once they are reviewed by [place]. If we ordered any referrals today, please let us know if you have not heard from their office within the next week.   Please try these tips to maintain a healthy lifestyle:  Eat most of your calories during the day when you are active. Eliminate processed foods including packaged sweets (pies, cakes, cookies), reduce intake of potatoes, white bread, white pasta, and white rice. Look for whole grain options, oat flour or almond flour.  Each meal should contain half fruits/vegetables, one quarter protein, and one quarter carbs (no bigger than a computer mouse).  Cut down on sweet beverages. This includes juice, soda, and sweet tea. Also watch fruit intake, though this is a healthier sweet option, it still contains natural sugar! Limit to 3 servings daily.  Drink at least 1 glass of water with each meal and aim for at least 8 glasses per day  Exercise at least 150 minutes every week.

## 2022-04-05 NOTE — Progress Notes (Signed)
Subjective:     Patient ID: Cassandra Leach, female    DOB: 1971/06/15, 51 y.o.   MRN: [id number]  Chief Complaint  Patient presents with   Transfer of Care    Not fasting     HPI TOC from [facility]-FNP-just wants new provider.    OAB-seeing urol.  On mybetriq 50mg  now.  Not sure working.  Oxybutinin-SE.  No leak but urge.  Doing PT as well.  LBP-recent-saw and got muscle relaxor and voltaren and better after 1 wk.  Depression/anxiety-Amy Kateri Plummer at [facility]-doing well on this regimen,  no SI.  Recently adjusted ability up to 15mg .  SVT-on Bystolic working well.  First dx-HR >200.  2016.  Doing well.  If dose dec, then breakthru. Card yearly.  PreDM-working on TLC-has lost 8# in 1 mo.  Walking, doing bike. [facility] wt and wellness-ins not cover saxenda.  Didn't return.   There are no preventive care reminders to display for this patient.  Past Medical History:  Diagnosis Date   Anxiety    Asthma    Depression    History of migraine    No issue in 2 years   IBS (irritable bowel syndrome)    Insomnia    Mild sleep apnea    Prediabetes    SVT (supraventricular tachycardia) (HCC)    Vitamin D deficiency     Past Surgical History:  Procedure Laterality Date   RADIOFREQUENCY ABLATION NERVES     neck    Outpatient Medications Prior to Visit  Medication Sig Dispense Refill   ARIPiprazole (ABILIFY) 15 MG tablet Take 15 mg by mouth daily.     azelastine (ASTELIN) 0.1 % nasal spray Place 1 spray into both nostrils 2 (two) times daily. Use in each nostril as directed 30 mL 12   busPIRone (BUSPAR) 30 MG tablet Take 30 mg by mouth 2 (two) times daily.     Cholecalciferol (VITAMIN D3) 5000 units CAPS Take 1 capsule by mouth daily.     mirabegron ER (MYRBETRIQ) 25 MG TB24 tablet Take 1 tablet (25 mg total) by mouth daily. (Patient taking differently: Take 50 mg by mouth daily.) 90 tablet 0   nebivolol (BYSTOLIC) 5 MG tablet Take 1 tablet (5 mg total) by mouth  daily. Please keep upcoming appt in March 2023 with Cardiologist before anymore refills. Thank you Final Attempt 90 tablet 3   temazepam (RESTORIL) 30 MG capsule Take 30 mg by mouth at bedtime as needed.     TRINTELLIX 20 MG TABS tablet Take 20 mg by mouth daily.     cyclobenzaprine (FLEXERIL) 10 MG tablet Take 1 tablet (10 mg total) by mouth 3 (three) times daily as needed for muscle spasms. 30 tablet 0   diclofenac (VOLTAREN) 75 MG EC tablet Take 1 tablet (75 mg total) by mouth 2 (two) times daily. 30 tablet 0   ARIPiprazole (ABILIFY) 10 MG tablet Take 10 mg by mouth daily.     benzonatate (TESSALON PERLES) 100 MG capsule 1-2 capsules up to twice daily as needed for cough (Patient not taking: Reported on 02/12/2022) 30 capsule 0   buPROPion (WELLBUTRIN XL) 150 MG 24 hr tablet Take 150 mg by mouth at bedtime.     meloxicam (MOBIC) 7.5 MG tablet Take 1 tablet (7.5 mg total) by mouth daily as needed for pain (headache). 30 tablet 1   No facility-administered medications prior to visit.    Allergies  Allergen Reactions   Neosporin [Bacitracin-Polymyxin B]  Rash   Penicillins Nausea And Vomiting   Sulfa Antibiotics Nausea And Vomiting   ROS  ROS: Gen: no fever, chills  Skin: no rash, itching ENT: no ear pain, ear drainage, nasal congestion, rhinorrhea, sinus pressure, sore throat Eyes: no blurry vision, double vision Resp: no cough, wheeze,SOB CV: no CP, palpitations, LE edema,  GI: no heartburn, n/v/d/c, abd pain.  Loose stools better w/dietary changes. Cscope at 43-GI issues SIBO.  GU: OAB.   Irreg menses 5/15-but missing.  Feeling hot in general.  Pap-1 yr ago.  Mamm due.-gyn Wyline Beady.  MSK: no joint pain, myalgias, back pain Neuro: no dizziness, headache, weakness, vertigo.  Migraines gone since cerv ablation. Psych: no depression, anxiety, insomnia, SI      Objective:     BP 120/78   Pulse 77   Temp 97.7 F (36.5 C) (Temporal)   Ht 5\' 5"  (1.651 m)   Wt 194 lb 6 oz  (88.2 kg)   LMP 03/29/2022 (Exact Date)   SpO2 97%   BMI 32.35 kg/m  Wt Readings from Last 3 Encounters:  04/05/22 194 lb 6 oz (88.2 kg)  03/22/22 196 lb 6.4 oz (89.1 kg)  02/12/22 200 lb (90.7 kg)    Physical Exam   Gen: WDWN NAD HEENT: NCAT, conjunctiva not injected, sclera nonicteric NECK:  supple, no thyromegaly, no nodes, no carotid bruits CARDIAC: RRR, S1S2+, no murmur. DP 2+B LUNGS: CTAB. No wheezes ABDOMEN:  BS+, soft, NTND, No HSM, no masses EXT:  no edema MSK: no gross abnormalities.  Some muscle tightness lower back NEURO: A&O x3.  CN II-XII intact.  PSYCH: normal mood. Good eye contact  Reviewed labs.     Assessment & Plan:   Problem List Items Addressed This Visit       Cardiovascular and Mediastinum   SVT (supraventricular tachycardia) (HCC) - Primary     Other   Generalized anxiety disorder   Relevant Medications   busPIRone (BUSPAR) 30 MG tablet   Depression, major, single episode, mild (HCC)   Relevant Medications   busPIRone (BUSPAR) 30 MG tablet   Other Visit Diagnoses     Acute right-sided low back pain without sciatica       Relevant Medications   cyclobenzaprine (FLEXERIL) 10 MG tablet   diclofenac (VOLTAREN) 75 MG EC tablet   Prediabetes         LBP-improved.  Pt active.  Still some tenderness.  Refilled diclofenac 75mg  and flexeril 10mg .  Stretches discussed. SVT-well controlled on Bystolic 5mg .  Continue.  Sees card Depression/anxiety-well controlled on meds.  Continue.  Managed by psych. preDM-reviewed labs from Nov.  Pt working on .  F/u 6 mo for annual and will repeat labs.   Meds ordered this encounter  Medications   cyclobenzaprine (FLEXERIL) 10 MG tablet    Sig: Take 1 tablet (10 mg total) by mouth 3 (three) times daily as needed for muscle spasms.    Dispense:  30 tablet    Refill:  1   diclofenac (VOLTAREN) 75 MG EC tablet    Sig: Take 1 tablet (75 mg total) by mouth 2 (two) times daily.    Dispense:  30  tablet    Refill:  3    , MD

## 2022-05-19 ENCOUNTER — Telehealth (INDEPENDENT_AMBULATORY_CARE_PROVIDER_SITE_OTHER): Admitting: Family Medicine

## 2022-05-19 ENCOUNTER — Encounter: Payer: Self-pay | Admitting: Family Medicine

## 2022-05-19 VITALS — Ht 65.0 in

## 2022-05-19 DIAGNOSIS — R519 Headache, unspecified: Secondary | ICD-10-CM

## 2022-05-19 DIAGNOSIS — M542 Cervicalgia: Secondary | ICD-10-CM

## 2022-05-19 MED ORDER — BUTALBITAL-APAP-CAFFEINE 50-325-40 MG PO TABS: 1.0000 | ORAL_TABLET | Freq: Two times a day (BID) | ORAL | 0 refills | Status: AC | PRN

## 2022-05-19 NOTE — Progress Notes (Signed)
Virtual Visit via Video Note I connected with Cassandra Leach on 05/19/22 by a video enabled telemedicine application and verified that I am speaking with the correct person using two identifiers.  Location patient: home Location provider:work office Persons participating in the virtual visit: patient, provider  I discussed the limitations of evaluation and management by telemedicine and the availability of in person appointments. The patient expressed understanding and agreed to proceed.  Chief Complaint  Patient presents with   Headache   HPI: Cassandra Leach is a 51 yo female with hx of SVT, insomnia, hypertension, GAD, depression, allergies,and chronic neck pain complaining of bitemporal and upper occipital headache that is started yesterday. Dull/achy headache, max 8/10.  This is different to her regular headaches, usually associated with neck pain.  She has tried meloxicam and Zanaflex for upper back pain unsuccessfully.  A few years ago she had severe headaches until nerve ablation done in 2015.  No associated fever,chills, abnormal weight loss, visual changes, sore throat, dysphagia,nausea, vomiting, focal neurologic deficit, numbness/tingling, or body aches. "Little" photophobia. Negative for dietary changes or new stressors. No changes in sleep.  A few years ago she had Fioricet with codeine, this was the only medication that helped with headaches at that time. She has not had head imaging since 2015, at that time she was also seeing neurologist.  She took Flexeril and a Voltaren 75 mg this morning, did not seem to help. ROS: See pertinent positives and negatives per HPI.  Past Medical History:  Diagnosis Date   Anxiety    Asthma    Depression    History of migraine    No issue in 2 years   IBS (irritable bowel syndrome)    Insomnia    Mild sleep apnea    Prediabetes    SVT (supraventricular tachycardia) (HCC)    Vitamin D deficiency    Past Surgical History:  Procedure  Laterality Date   RADIOFREQUENCY ABLATION NERVES     neck   Family History  Problem Relation Age of Onset   Hypertension Mother    Hyperlipidemia Mother    Suicidality Brother    Angina Paternal Grandmother    Social History   Socioeconomic History   Marital status: Married    Spouse name: Not on file   Number of children: 0   Years of education: Not on file   Highest education level: Not on file  Occupational History   Occupation: stay at home spouse  Tobacco Use   Smoking status: Never   Smokeless tobacco: Never   Tobacco comments:    Smoked when drinking in 5s.  Vaping Use   Vaping Use: Former   Quit date: 11/15/2018  Substance and Sexual Activity   Alcohol use: Yes    Comment: OCC   Drug use: No   Sexual activity: Yes    Birth control/protection: Other-see comments    Comment: VAS.  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 Use in each nostril as directed, Disp: 30 mL, Rfl: 12   busPIRone (BUSPAR) 30 MG tablet, Take 30 mg by mouth 2 (two) times daily., Disp: , Rfl:    Cholecalciferol (VITAMIN D3) 5000 units CAPS, Take 1 capsule by mouth daily., Disp: , Rfl:    cyclobenzaprine (FLEXERIL) 10 MG tablet, Take 1 tablet (10 mg total) by mouth 3 (three) times daily as needed for muscle spasms., Disp: 30 tablet, Rfl: 1   diclofenac (VOLTAREN) 75 MG EC tablet, Take 1 tablet (75 mg total) by mouth 2 (two) times daily., Disp: 30 tablet, Rfl: 3    mirabegron ER (MYRBETRIQ) 25 MG TB24 tablet, Take 1 tablet (25 mg total) by mouth daily. (Patient taking differently: Take 50 mg by mouth daily.), Disp: 90 tablet, Rfl: 0   nebivolol (BYSTOLIC) 5 MG tablet, Take 1 tablet (5 mg total) by mouth daily. Please keep upcoming appt in March 2023 with Cardiologist before anymore refills. Thank you Final Attempt, Disp: 90 tablet, Rfl: 3   temazepam (RESTORIL) 30 MG capsule, Take 30 mg by mouth at bedtime as needed., Disp: , Rfl:    TRINTELLIX 20 MG TABS tablet, Take 20 mg by mouth daily., Disp: , Rfl:   EXAM:  VITALS per patient if applicable:Ht 5\' 5"  (1.651 m)   BMI 32.35 kg/m   GENERAL: alert, oriented, appears well and in no acute distress  HEENT: atraumatic, conjunctiva clear, no obvious abnormalities on inspection.  LUNGS: on inspection no signs of respiratory distress, breathing rate appears normal, no obvious gross SOB, gasping or wheezing  CV: no obvious cyanosis  MS: moves all visible extremities without noticeable abnormality  PSYCH/NEURO: pleasant and cooperative, no obvious depression or anxiety, speech and thought processing grossly intact. I do not appreciate focal weakness.  ASSESSMENT AND PLAN:  Discussed the following assessment and plan:  Headache, unspecified headache type - Plan: butalbital-acetaminophen-caffeine (FIORICET) 50-325-40 MG tablet  Cervicalgia We discussed possible etiologies. History does not suggest a serious process. I do not think she needs to go to the ER to have head imaging done at this time. It seems to be tension headache. Fioricet+ codeine helped years ago, I sent a Rx for Fioricet but w/o codeine. She was clearly instructed about warning signs. Instructed to follow-up with PCP if headaches become persistent.  Cervicalgia Continue Flexeril 10 mg daily as needed and Voltaren 75 mg twice daily as needed. These medications are prescribed by her PCP.  We discussed possible serious and likely  etiologies, options for evaluation and workup, limitations of telemedicine visit vs in person visit, treatment, treatment risks and precautions. The patient was advised to call back or seek an in-person evaluation if the symptoms worsen or if the condition fails to improve as anticipated. I discussed the assessment and treatment plan with the patient. The patient was provided an opportunity to ask questions and all were answered. The patient agreed with the plan and demonstrated an understanding of the instructions.  Return if symptoms worsen or fail to improve.  Betty G. , MD  [facility]. [place] office.

## 2022-06-23 ENCOUNTER — Encounter (INDEPENDENT_AMBULATORY_CARE_PROVIDER_SITE_OTHER): Payer: Self-pay

## 2022-08-09 ENCOUNTER — Encounter: Payer: Self-pay | Admitting: *Deleted

## 2022-10-11 ENCOUNTER — Ambulatory Visit (INDEPENDENT_AMBULATORY_CARE_PROVIDER_SITE_OTHER): Admitting: Family Medicine

## 2022-10-11 ENCOUNTER — Encounter: Payer: Self-pay | Admitting: Family Medicine

## 2022-10-11 VITALS — BP 126/80 | HR 62 | Temp 98.1°F | Ht 65.0 in | Wt 177.2 lb

## 2022-10-11 DIAGNOSIS — Z0000 Encounter for general adult medical examination without abnormal findings: Secondary | ICD-10-CM | POA: Diagnosis not present

## 2022-10-11 DIAGNOSIS — Z1159 Encounter for screening for other viral diseases: Secondary | ICD-10-CM | POA: Diagnosis not present

## 2022-10-11 DIAGNOSIS — Z114 Encounter for screening for human immunodeficiency virus [HIV]: Secondary | ICD-10-CM | POA: Diagnosis not present

## 2022-10-11 LAB — CBC WITH DIFFERENTIAL/PLATELET
Basophils Absolute: 0.1 10*3/uL (ref 0.0–0.1)
Basophils Relative: 1.3 % (ref 0.0–3.0)
Eosinophils Absolute: 0.1 10*3/uL (ref 0.0–0.7)
Eosinophils Relative: 1.9 % (ref 0.0–5.0)
HCT: 40.7 % (ref 36.0–46.0)
Hemoglobin: 13.8 g/dL (ref 12.0–15.0)
Lymphocytes Relative: 33.9 % (ref 12.0–46.0)
Lymphs Abs: 1.5 10*3/uL (ref 0.7–4.0)
MCHC: 33.8 g/dL (ref 30.0–36.0)
MCV: 90.5 fl (ref 78.0–100.0)
Monocytes Absolute: 0.4 10*3/uL (ref 0.1–1.0)
Monocytes Relative: 8.4 % (ref 3.0–12.0)
Neutro Abs: 2.5 10*3/uL (ref 1.4–7.7)
Neutrophils Relative %: 54.5 % (ref 43.0–77.0)
Platelets: 251 10*3/uL (ref 150.0–400.0)
RBC: 4.49 Mil/uL (ref 3.87–5.11)
RDW: 13.2 % (ref 11.5–15.5)
WBC: 4.5 10*3/uL (ref 4.0–10.5)

## 2022-10-11 LAB — COMPREHENSIVE METABOLIC PANEL
ALT: 13 U/L (ref 0–35)
AST: 15 U/L (ref 0–37)
Albumin: 4.3 g/dL (ref 3.5–5.2)
Alkaline Phosphatase: 33 U/L — ABNORMAL LOW (ref 39–117)
BUN: 14 mg/dL (ref 6–23)
CO2: 27 mEq/L (ref 19–32)
Calcium: 8.8 mg/dL (ref 8.4–10.5)
Chloride: 102 mEq/L (ref 96–112)
Creatinine, Ser: 0.8 mg/dL (ref 0.40–1.20)
GFR: 84 mL/min (ref >60.00)
Glucose, Bld: 104 mg/dL — ABNORMAL HIGH (ref 70–99)
Potassium: 4.1 mEq/L (ref 3.5–5.1)
Sodium: 135 mEq/L (ref 135–145)
Total Bilirubin: 0.3 mg/dL (ref 0.2–1.2)
Total Protein: 6.7 g/dL (ref 6.0–8.3)

## 2022-10-11 LAB — LIPID PANEL
Cholesterol: 164 mg/dL (ref 0–200)
HDL: 40.2 mg/dL (ref >39.00)
LDL Cholesterol: 107 mg/dL — ABNORMAL HIGH (ref 0–99)
NonHDL: 123.6
Total CHOL/HDL Ratio: 4
Triglycerides: 82 mg/dL (ref 0.0–149.0)
VLDL: 16.4 mg/dL (ref 0.0–40.0)

## 2022-10-11 LAB — HEMOGLOBIN A1C: Hgb A1c MFr Bld: 5.7 % (ref 4.6–6.5)

## 2022-10-11 LAB — TSH: TSH: 2.18 u[IU]/mL (ref 0.35–5.50)

## 2022-10-11 MED ORDER — TIZANIDINE HCL 4 MG PO TABS: 4.0000 mg | ORAL_TABLET | Freq: Four times a day (QID) | ORAL | 1 refills | Status: DC | PRN

## 2022-10-11 MED ORDER — MELOXICAM 15 MG PO TABS: 15.0000 mg | ORAL_TABLET | Freq: Every day | ORAL | 1 refills | Status: DC

## 2022-10-11 NOTE — Addendum Note (Signed)
Addended by: Lorn Junes on: 10/11/2022 08:39 AM   Modules accepted: Orders

## 2022-10-11 NOTE — Progress Notes (Signed)
Phone [phone]   Subjective:   Patient is a 51 y.o. female presenting for annual physical.    Chief Complaint  Patient presents with   Annual Exam   Knee Pain    Pt c/o knees aching x several weeks. Has been icing both knees with some relief.  Voltaren with no relief.   Annual-pap thru gyn Knee pain-achey.  Voltaren not working any more.  Meloxicam in past helped.  See problem oriented charting- ROS- ROS: Gen: no fever, chills  Skin: no rash, itching ENT: no ear pain, ear drainage, nasal congestion, rhinorrhea, sinus pressure, sore throat Eyes: no blurry vision, double vision Resp: no cough, wheeze,SOB CV: no CP, palpitations, LE edema,  GI: no heartburn, n/v/d/, abd pain.  Occ constipation. GU: no dysuria, urgency, frequency, hematuria.  Menses 25-28days.  vasectomy MSK: HPI.  Neck pain-chronic.  Zanaflex better in past.  HA 1-2x/yr -fioricet w/codeine in past.  Plain not work.  Neuro: no dizziness,weakness, vertigo Psych: no depression, anxiety, insomnia, SI   well controlled  The following were reviewed and entered/updated in epic: Past Medical History:  Diagnosis Date   Anxiety    Asthma    Depression    History of migraine    No issue in 2 years   IBS (irritable bowel syndrome)    Insomnia    Mild sleep apnea    Prediabetes    SVT (supraventricular tachycardia)    Vitamin D deficiency    Patient Active Problem List   Diagnosis Date Noted   Hypertension 11/13/2019   Small intestinal bacterial overgrowth 07/09/2019   Cervicalgia 10/02/2018   Primary insomnia 07/19/2017   SVT (supraventricular tachycardia) 04/01/2017   Generalized anxiety disorder 04/01/2017   Depression, major, single episode, mild (HCC) 04/01/2017   Past Surgical History:  Procedure Laterality Date   RADIOFREQUENCY ABLATION NERVES     neck    Family History  Problem Relation Age of Onset   Hypertension Mother    Hyperlipidemia Mother    Suicidality Brother    Angina Paternal  Grandmother     Medications- reviewed and updated Current Outpatient Medications  Medication Sig Dispense Refill   ARIPiprazole (ABILIFY) 15 MG tablet Take 15 mg by mouth daily.     azelastine (ASTELIN) 0.1 % nasal spray Place 1 spray into both nostrils 2 (two) times daily. Use in each nostril as directed 30 mL 12   buPROPion (WELLBUTRIN XL) 300 MG 24 hr tablet Take 300 mg by mouth daily.     busPIRone (BUSPAR) 30 MG tablet Take 30 mg by mouth 2 (two) times daily.     Cholecalciferol (VITAMIN D3) 5000 units CAPS Take 1 capsule by mouth daily.     meloxicam (MOBIC) 15 MG tablet Take 1 tablet (15 mg total) by mouth daily. 90 tablet 1   nebivolol (BYSTOLIC) 5 MG tablet Take 1 tablet (5 mg total) by mouth daily. Please keep upcoming appt in March 2023 with Cardiologist before anymore refills. Thank you Final Attempt 90 tablet 3   temazepam (RESTORIL) 30 MG capsule Take 30 mg by mouth at bedtime as needed.     tiZANidine (ZANAFLEX) 4 MG tablet Take 1 tablet (4 mg total) by mouth every 6 (six) hours as needed for muscle spasms. 30 tablet 1   TRINTELLIX 20 MG TABS tablet Take 20 mg by mouth daily.     No current facility-administered medications for this visit.    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 DP 2+B LUNGS: CTAB. No wheezes ABDOMEN:  BS+, soft, NTND, No HSM, no masses EXT:  no edema MSK: no gross abnormalities. MS 5/5 all 4 NEURO: A&O x3.  CN II-XII intact.  PSYCH: normal mood. Good eye contact    Assessment and Plan   Health Maintenance counseling: 1. Anticipatory guidance: Patient counseled regarding regular dental exams q6 months, eye exams,  avoiding smoking and second hand smoke, limiting alcohol to 1 beverage per day, no illicit drugs.   2. Risk factor reduction:  Advised patient of need for regular exercise and diet rich and fruits and vegetables to reduce risk of heart attack and stroke. Exercise- +.  Wt Readings from Last 3 Encounters:  10/11/22 177 lb 4 oz (80.4 kg)  04/05/22 194 lb 6 oz (88.2 kg)  03/22/22 196 lb 6.4 oz (89.1 kg)   3. Immunizations/screenings/ancillary studies Immunization History  Administered Date(s) Administered   COVID-19, mRNA, vaccine(Comirnaty)12 years and older 08/11/2022   Influenza,inj,Quad PF,6+ Mos 08/16/2017, 08/18/2018, 08/14/2019, 09/03/2020   Influenza-Unspecified 10/04/2021, 08/11/2022   PFIZER(Purple Top)SARS-COV-2 Vaccination 02/03/2020, 02/24/2020, 10/10/2020, 03/09/2021, 07/28/2021   Zoster Recombinat (Shingrix) 08/14/2019   Zoster, Live 10/16/2019   Zoster, Unspecified 10/16/2019   Health Maintenance Due  Topic Date Due   Hepatitis C Screening  Never done   COVID-19 Vaccine (6 - 2023-24 season) 07/16/2022   PAP SMEAR-Modifier  11/12/2022    4. Cervical cancer screening- utd 5. Breast cancer screening-  mammogram utd 6. Colon cancer screening - utd 7. Skin cancer screening- advised regular sunscreen use. Denies worrisome, changing, or new skin lesions.  8. Birth control/STD check- vasectomy 9. Osteoporosis screening- n/a 10. Smoking associated screening - non smoker  Problem List Items Addressed This Visit   None Visit Diagnoses     Wellness examination    -  Primary   Relevant Orders   Comprehensive  metabolic panel   Hemoglobin A1c   TSH   Lipid panel   CBC with Differential/Platelet   Hepatitis C antibody   HIV Antibody (routine testing w rflx)   Screening for HIV without presence of risk factors       Relevant Orders   HIV Antibody (routine testing w rflx)   Encounter for hepatitis C screening test for low risk patient       Relevant Orders   Hepatitis C antibody      Wellness-anticipatory guidance.  Work on 11/14/2022.  Check CBC,CMP,lipids,TSH, A1C.  F/u 1 yr  Knee pain-meloxicam 15mg  daily prn.  See sports med if no relief  Recommended follow up: annual Return in about 1 year (around 10/12/2023) for annual. No future appointments.  Lab/Order associations:+ fasting   ICD-10-CM   1. Wellness examination  Z00.00 Comprehensive metabolic panel  Hemoglobin A1c    TSH    Lipid panel    CBC with Differential/Platelet    Hepatitis C antibody    HIV Antibody (routine testing w rflx)    2. Screening for HIV without presence of risk factors  Z11.4 HIV Antibody (routine testing w rflx)    3. Encounter for hepatitis C screening test for low risk patient  Z11.59 Hepatitis C antibody      Meds ordered this encounter  Medications   tiZANidine (ZANAFLEX) 4 MG tablet    Sig: Take 1 tablet (4 mg total) by mouth every 6 (six) hours as needed for muscle spasms.    Dispense:  30 tablet    Refill:  1   meloxicam (MOBIC) 15 MG tablet    Sig: Take 1 tablet (15 mg total) by mouth daily.    Dispense:  90 tablet    Refill:  1    Angelena Sole, MD

## 2022-10-11 NOTE — Patient Instructions (Addendum)
It was very nice to see you today!  Sports med:[phone]  Stool softener-colace 100mg  daily as needed, phillips magnesium, miralax.    Benefiber daily.   PLEASE NOTE:  If you had any lab tests please let know if you have not heard back within a few days. You may see your results on MyChart before we have a chance to review them but we will give you a call once they are reviewed by [place]. If we ordered any referrals today, please let us know if you have not heard from their office within the next week.   Please try these tips to maintain a healthy lifestyle:  Eat most of your calories during the day when you are active. Eliminate processed foods including packaged sweets (pies, cakes, cookies), reduce intake of potatoes, white bread, white pasta, and white rice. Look for whole grain options, oat flour or almond flour.  Each meal should contain half fruits/vegetables, one quarter protein, and one quarter carbs (no bigger than a computer mouse).  Cut down on sweet beverages. This includes juice, soda, and sweet tea. Also watch fruit intake, though this is a healthier sweet option, it still contains natural sugar! Limit to 3 servings daily.  Drink at least 1 glass of water with each meal and aim for at least 8 glasses per day  Exercise at least 150 minutes every week.

## 2022-10-12 LAB — HIV ANTIBODY (ROUTINE TESTING W REFLEX): HIV 1&2 Ab, 4th Generation: NONREACTIVE

## 2022-10-12 LAB — HEPATITIS C ANTIBODY: Hepatitis C Ab: NONREACTIVE

## 2022-10-14 NOTE — Progress Notes (Signed)
Labs normal except A1C(3 month average of sugars) is elevated.  This is considered PreDiabetes.  Work on diet-decrease sugars and starches and aim for 30 minutes of exercise 5 days/week to prevent progression to diabetes

## 2022-11-22 ENCOUNTER — Encounter: Payer: Self-pay | Admitting: Family Medicine

## 2022-12-02 IMAGING — MG DIGITAL DIAGNOSTIC BILAT W/ TOMO W/ CAD
6 of 10 series · 6 of 30 positions shown · non-contrast
Comparison: Previous exam(s).

CLINICAL DATA: 50-year-old female presenting for delayed follow-up
of a probably benign left breast mass and a new palpable left breast
lump.

EXAM:
DIGITAL DIAGNOSTIC BILATERAL MAMMOGRAM WITH TOMOSYNTHESIS AND CAD;
ULTRASOUND LEFT BREAST LIMITED
TECHNIQUE: Bilateral digital diagnostic mammography and breast tomosynthesis
was performed. The images were evaluated with computer-aided
detection.; Targeted ultrasound examination of the left breast was
performed

[R CC synth-2D]
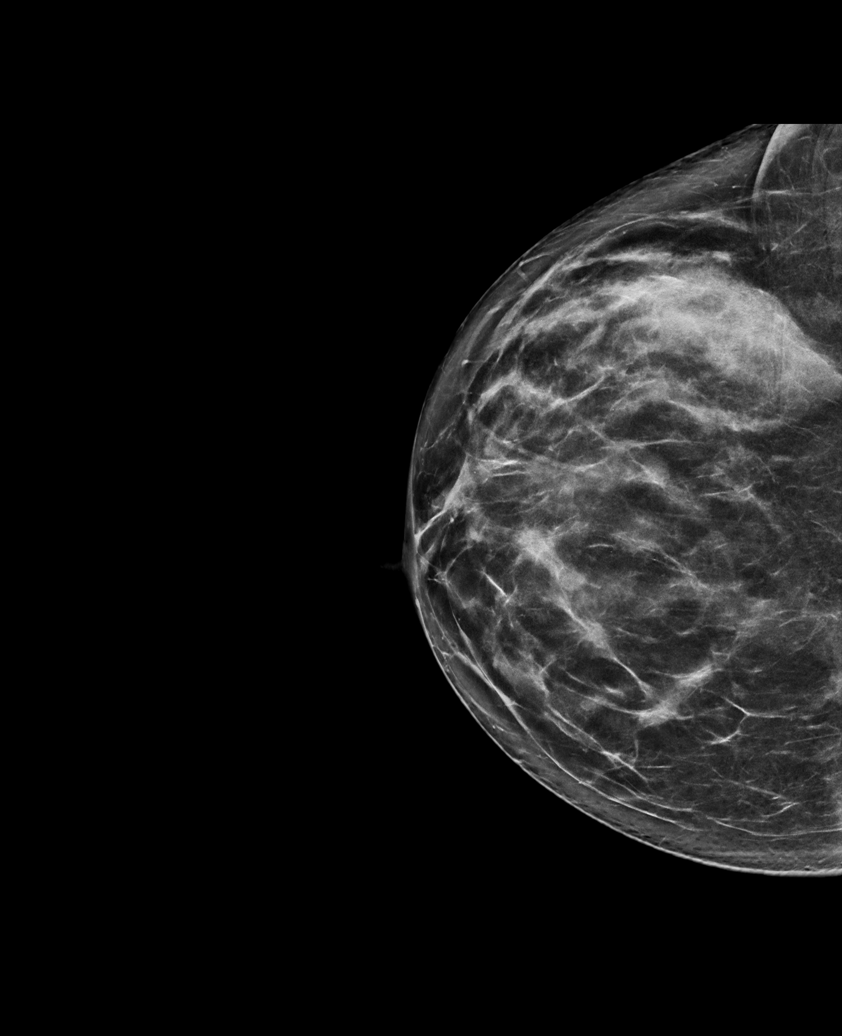

[R MLO synth-2D]
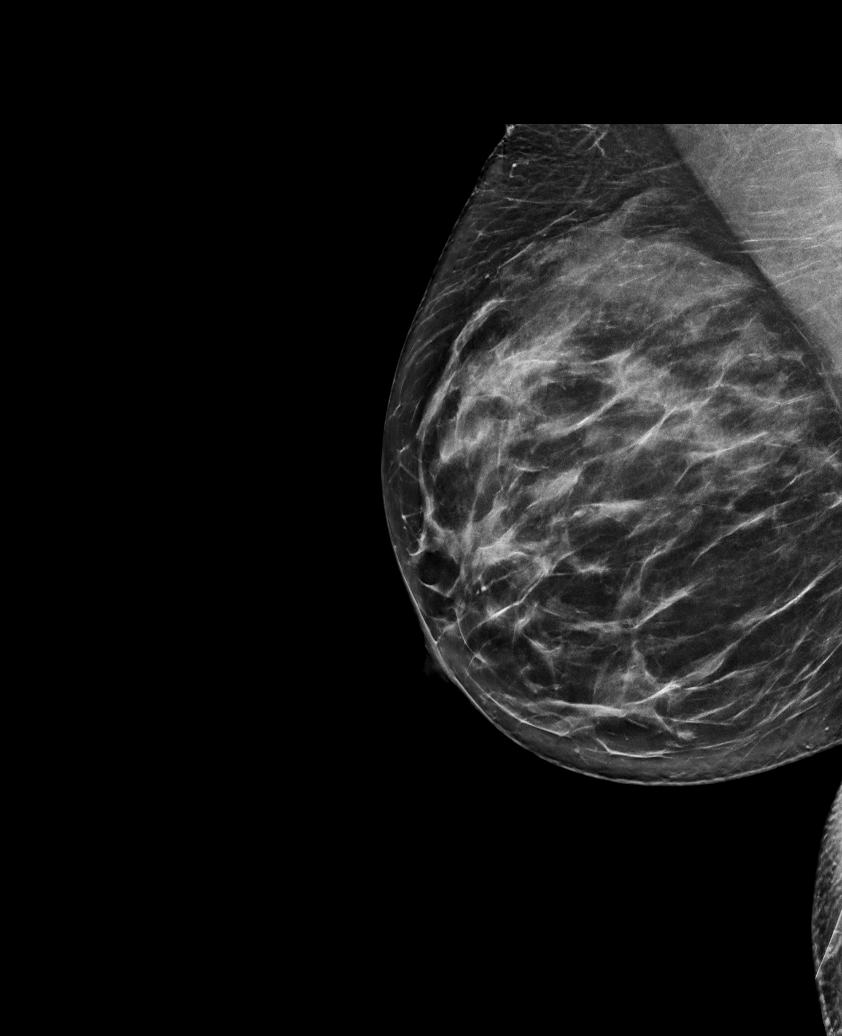

[L MLO synth-2D]
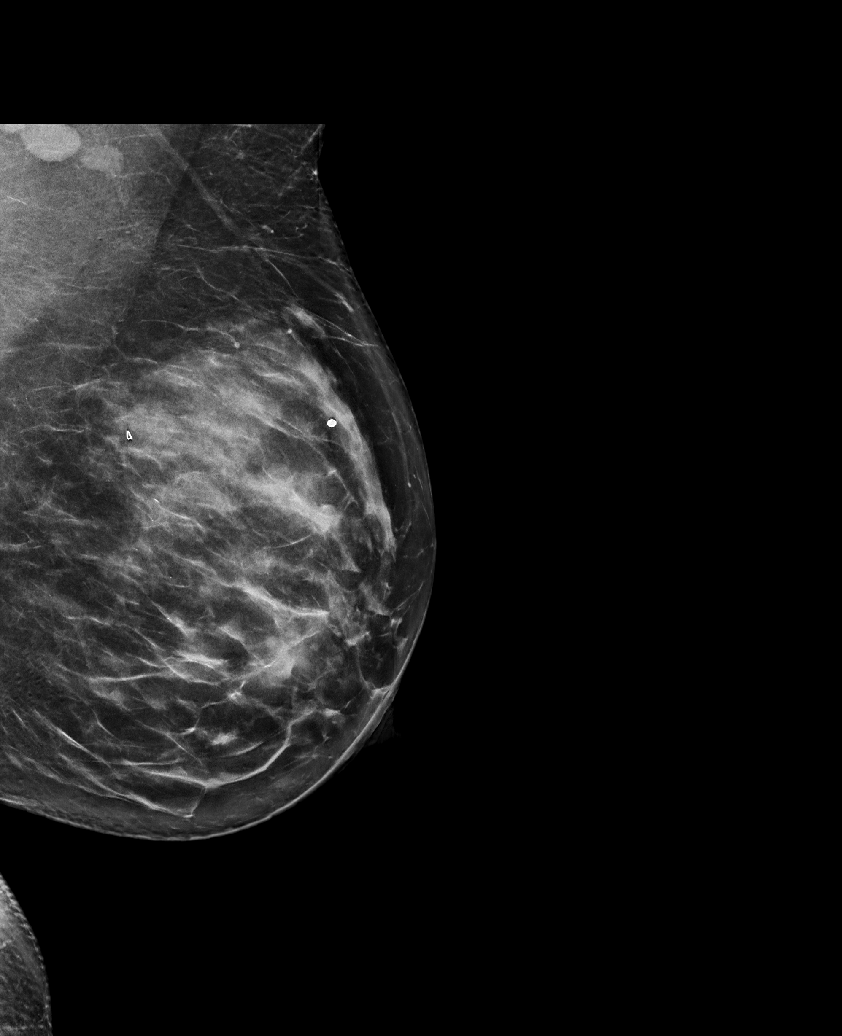

[L CC synth-2D (1 of 2)]
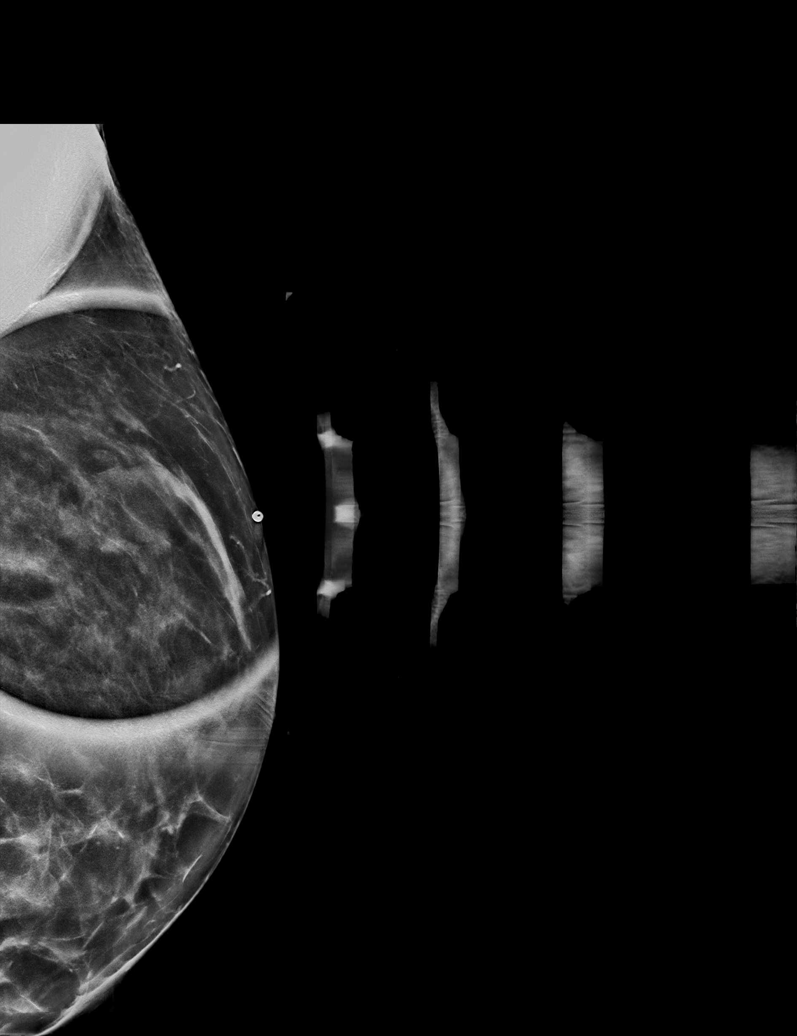

[L CC synth-2D (2 of 2)]
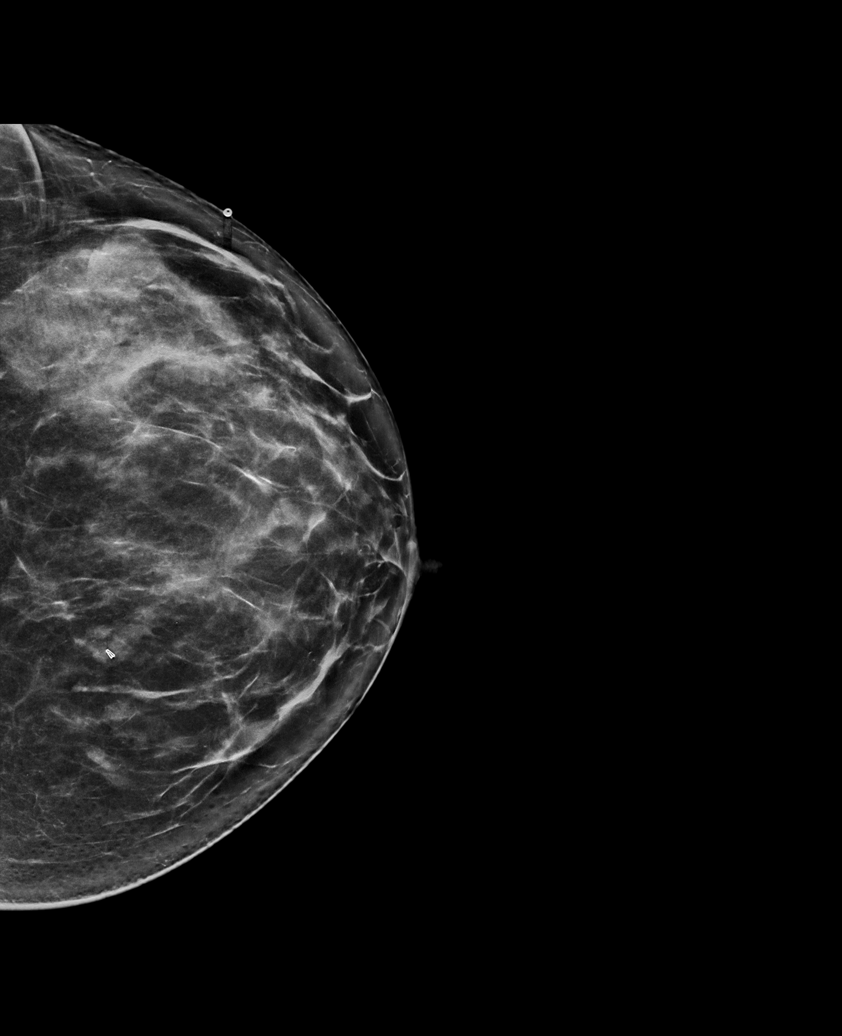

[L CC tomo · tomo slice 38/75.0]
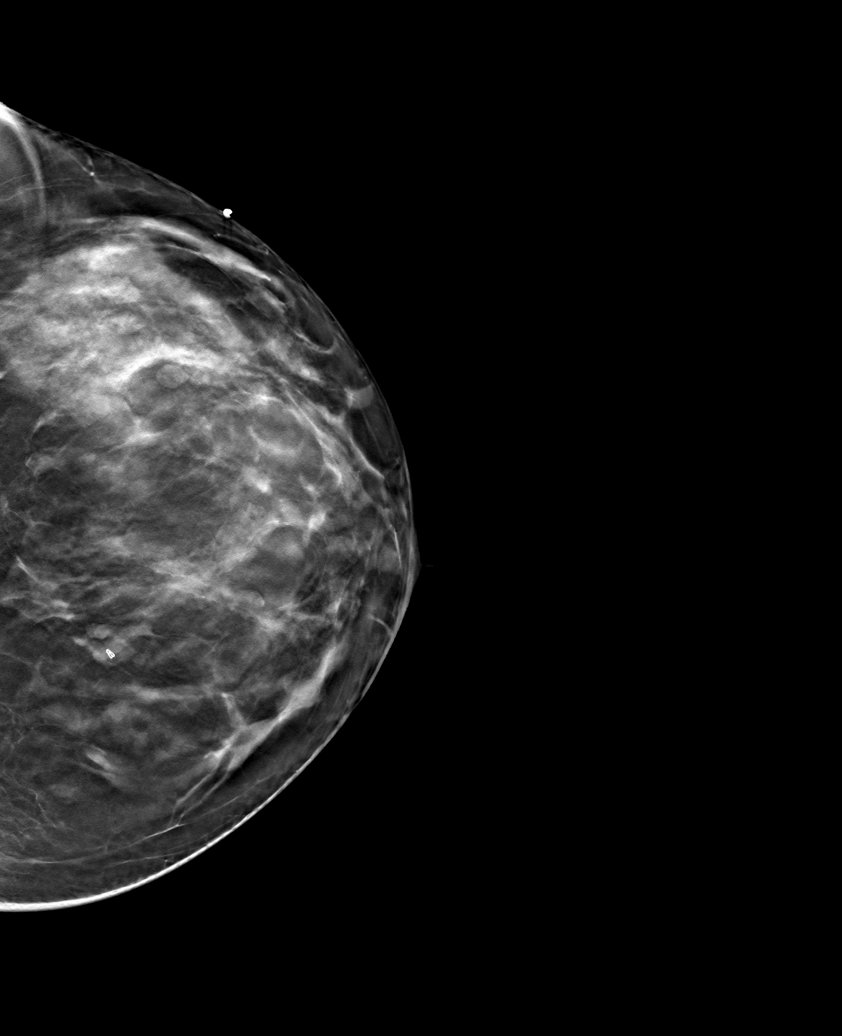

[6 of 30 positions shown; findings below may reference images not displayed]

ACR Breast Density Category c: The breast tissue is heterogeneously
dense, which may obscure small masses.
FINDINGS: Post biopsy changes demonstrated in the upper inner quadrant of the
left breast. A focal asymmetry slightly medial to the biopsy clip is
less conspicuous on today's views. Radiopaque BB is placed in the
upper outer left breast at the site of the palpable lump. An oval,
circumscribed equal density mass is seen deep to the radiopaque BB.

Otherwise, no new or suspicious findings in the remainder of either
breast.

Targeted ultrasound is performed, showing an oval, circumscribed
multi-cystic mass at the 10 o'clock position 7 cm from the nipple.
It measures 1.4 x 1.4 x 0.3 cm (previously 1.8 x 1.5 x 0.4 cm). An
oval, circumscribed anechoic mass is demonstrated at the [DATE]
position 8 cm from the nipple. It measures 1.9 x 1.5 x 1.2 cm. This
correlates well with the mammographic finding and is consistent with
a benign simple cyst.
IMPRESSION: 1. Stable, probably benign left breast mass at the 10 o'clock
position 7 cm from the nipple. Recommend a final ultrasound
follow-up in 1 year to coincide with the patient's annual bilateral
mammogram.
2. Benign left breast cyst corresponding with the palpable lump. No
further imaging follow-up required.
3. No mammographic evidence of malignancy on the right.

RECOMMENDATION:
Bilateral diagnostic mammogram and left breast ultrasound in 1 year.

I have discussed the findings and recommendations with the patient.
If applicable, a reminder letter will be sent to the patient
regarding the next appointment.

BI-RADS CATEGORY  3: Probably benign.

## 2023-01-06 ENCOUNTER — Telehealth: Payer: Self-pay | Admitting: Pulmonary Disease

## 2023-01-06 NOTE — Telephone Encounter (Signed)
Faxed sleep HST to [facility]. Nothing further needed.

## 2023-01-11 ENCOUNTER — Other Ambulatory Visit: Payer: Self-pay

## 2023-01-11 ENCOUNTER — Other Ambulatory Visit: Payer: Self-pay | Admitting: Family Medicine

## 2023-01-11 MED ORDER — NEBIVOLOL HCL 5 MG PO TABS: 5.0000 mg | ORAL_TABLET | Freq: Every day | ORAL | 1 refills | Status: DC

## 2023-01-21 ENCOUNTER — Telehealth (INDEPENDENT_AMBULATORY_CARE_PROVIDER_SITE_OTHER): Admitting: Family

## 2023-01-21 VITALS — Ht 65.0 in | Wt 165.0 lb

## 2023-01-21 DIAGNOSIS — R42 Dizziness and giddiness: Secondary | ICD-10-CM

## 2023-01-21 MED ORDER — MECLIZINE HCL 12.5 MG PO TABS: 12.5000 mg | ORAL_TABLET | Freq: Three times a day (TID) | ORAL | 0 refills | Status: DC | PRN

## 2023-01-21 NOTE — Progress Notes (Signed)
MyChart Video Visit    Virtual Visit via Video Note   This format is felt to be most appropriate for this patient at this time. Physical exam was limited by quality of the video and audio technology used for the visit. CMA was able to get the patient set up on a video visit.  Patient location: Home. Patient and provider in visit Provider location: Office  I discussed the limitations of evaluation and management by telemedicine and the availability of in person appointments. The patient expressed understanding and agreed to proceed.  Visit Date: 01/21/2023  Today's healthcare provider: Jeanie Sewer, NP     Subjective:   Patient ID: Cassandra Leach, female    DOB: February 09, 1971, 52 y.o.   MRN: [id number]  Chief Complaint  Patient presents with   Dizziness    Pt c/o lightheadedness and vomiting 3x started yesterday. Has tried zofran which did help symptoms. Dizziness when laying and walking around.    HPI Dizziness:  Pt reports not having had any alcohol in a long time, but had some wine with dinner last night, then went to bed & fell asleep but woke up w/dizziness & nausea, vomiting during the night. Reports having the dizziness upon standing and lying down, describes "things moving around her", feels ok when just sitting. Has taken one pill from an old bottle of Zofran which has helped her nausea.  Assessment & Plan:  Dizziness - sending Meclizine in case dizziness persists past today, advised on use & SE. Believe sx mostly related to drinking alcohol and interacting with her medications. Advised to avoid alcohol in future. Ok to continue Zofran as directed on bottle she has at home, hydrate well today, avoid greasy, high fat foods or dairy today. Call office back if sx persist and Meclizine does not help.  -     Meclizine HCl; Take 1 tablet (12.5 mg total) by mouth 3 (three) times daily as needed for dizziness.  Dispense: 30 tablet; Refill: 0  Past Medical History:   Diagnosis Date   Anxiety    Asthma    Depression    History of migraine    No issue in 2 years   IBS (irritable bowel syndrome)    Insomnia    Mild sleep apnea    Prediabetes    SVT (supraventricular tachycardia)    Vitamin D deficiency     Past Surgical History:  Procedure Laterality Date   RADIOFREQUENCY ABLATION NERVES     neck    Outpatient Medications Prior to Visit  Medication Sig Dispense Refill   ARIPiprazole (ABILIFY) 15 MG tablet Take 15 mg by mouth daily.     azelastine (ASTELIN) 0.1 % nasal spray Place 1 spray into both nostrils 2 (two) times daily. Use in each nostril as directed 30 mL 12   buPROPion (WELLBUTRIN XL) 300 MG 24 hr tablet Take 300 mg by mouth daily.     Cholecalciferol (VITAMIN D3) 5000 units CAPS Take 1 capsule by mouth daily.     meloxicam (MOBIC) 15 MG tablet Take 1 tablet (15 mg total) by mouth daily. 90 tablet 1   nebivolol (BYSTOLIC) 5 MG tablet Take 1 tablet (5 mg total) by mouth daily. 90 tablet 1   ondansetron (ZOFRAN) 4 MG tablet Take 4 mg by mouth every 8 (eight) hours as needed for nausea or vomiting.     temazepam (RESTORIL) 30 MG capsule Take 30 mg by mouth at bedtime as needed.  tiZANidine (ZANAFLEX) 4 MG tablet TAKE 1 TABLET(4 MG) BY MOUTH EVERY 6 HOURS AS NEEDED FOR MUSCLE SPASMS 30 tablet 1   TRINTELLIX 20 MG TABS tablet Take 20 mg by mouth daily.     busPIRone (BUSPAR) 30 MG tablet Take 30 mg by mouth 2 (two) times daily.     No facility-administered medications prior to visit.    Allergies  Allergen Reactions   Neosporin [Bacitracin-Polymyxin B] Rash   Penicillins Nausea And Vomiting   Sulfa Antibiotics Nausea And Vomiting       Objective:   Physical Exam Vitals and nursing note reviewed.  Constitutional:      General: Pt is not in acute distress.    Appearance: Normal appearance.  HENT:     Head: Normocephalic.  Pulmonary:     Effort: No respiratory distress.  Musculoskeletal:     Cervical back: Normal range  of motion.  Skin:    General: Skin is dry.     Coloration: Skin is not pale.  Neurological:     Mental Status: Pt is alert and oriented to person, place, and time.  Psychiatric:        Mood and Affect: Mood normal.   Ht '5\' 5"'$  (1.651 m)   Wt 165 lb (74.8 kg) Comment: pt reported  BMI 27.46 kg/m   Wt Readings from Last 3 Encounters:  01/21/23 165 lb (74.8 kg)  10/11/22 177 lb 4 oz (80.4 kg)  04/05/22 194 lb 6 oz (88.2 kg)      I discussed the assessment and treatment plan with the patient. The patient was provided an opportunity to ask questions and all were answered. The patient agreed with the plan and demonstrated an understanding of the instructions.   The patient was advised to call back or seek an in-person evaluation if the symptoms worsen or if the condition fails to improve as anticipated.  Jeanie Sewer, NP [place] [phone] (phone) [phone] (fax)  [place]

## 2023-01-24 ENCOUNTER — Encounter: Payer: Self-pay | Admitting: Family

## 2023-01-24 NOTE — Telephone Encounter (Signed)
Call patient and ask if her sx are persisting? Did the Meclizine help? I can send ENT referral but may take 2-3 mos to be seen. Can refer her to PT and they can help with head maneuvers which can help. Let me know what she says, thx.

## 2023-01-25 ENCOUNTER — Other Ambulatory Visit: Payer: Self-pay

## 2023-01-25 DIAGNOSIS — G44209 Tension-type headache, unspecified, not intractable: Secondary | ICD-10-CM

## 2023-01-25 DIAGNOSIS — R42 Dizziness and giddiness: Secondary | ICD-10-CM

## 2023-01-27 ENCOUNTER — Ambulatory Visit (INDEPENDENT_AMBULATORY_CARE_PROVIDER_SITE_OTHER): Admitting: Pulmonary Disease

## 2023-01-27 ENCOUNTER — Encounter: Payer: Self-pay | Admitting: Pulmonary Disease

## 2023-01-27 VITALS — BP 112/70 | HR 66 | Ht 65.0 in | Wt 172.2 lb

## 2023-01-27 DIAGNOSIS — G4733 Obstructive sleep apnea (adult) (pediatric): Secondary | ICD-10-CM

## 2023-01-27 NOTE — Patient Instructions (Signed)
Continue weight loss efforts  We will send a referral for an oral device for mild sleep apnea, nonrestorative sleep  You may need a repeat sleep study if your study from 2021 is deemed not valid  Tentative follow-up in 6 months

## 2023-01-27 NOTE — Progress Notes (Signed)
Subjective:    Patient ID: Cassandra Leach, female    DOB: Jul 27, 1971, 52 y.o.   MRN: [id number]  Patient is being seen for nonrestorative sleep  She had a sleep study done in 2021 showing an AHI of 7.9, tried CPAP for about 6 months, just could not tolerate it  She is in today for evaluation, wants to try an oral device  She still wakes up feeling not rested, not very sleepy during the day History of snoring  Bedtime about 10 PM, takes about 50 minutes to fall asleep 2 awakenings Final wake up time about 7:30 in the morning Denies any significant dryness or headache in the morning  She is down about 30 pounds since the last study  Takes temazepam to help her rest at night  Dad had obstructive sleep apnea  Never smoker Quit drinking November 2020  She is a Pharmacist, hospital  History significant for anxiety/depression    Past Medical History:  Diagnosis Date   Anxiety    Asthma    Depression    History of migraine    No issue in 2 years   IBS (irritable bowel syndrome)    Insomnia    Mild sleep apnea    Prediabetes    SVT (supraventricular tachycardia)    Vitamin D deficiency    Social History   Socioeconomic History   Marital status: Married    Spouse name: Not on file   Number of children: 0   Years of education: Not on file   Highest education level: Not on file  Occupational History   Occupation: stay at home spouse  Tobacco Use   Smoking status: Never   Smokeless tobacco: Never   Tobacco comments:    Smoked when drinking in 27s.  Vaping Use   Vaping Use: Former   Quit date: 11/15/2018  Substance and Sexual Activity   Alcohol use: Yes    Comment: OCC   Drug use: No   Sexual activity: Yes    Birth control/protection: Other-see comments    Comment: VAS.  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   Eyes:  Negative for redness and itching.  Respiratory:  Positive for apnea. Negative for cough, chest tightness, shortness of breath and wheezing.   Cardiovascular:  Negative for palpitations and leg swelling.  Genitourinary:  Negative for dysuria.  Musculoskeletal:  Negative for joint swelling.  Skin:  Negative for rash.  Allergic/Immunologic: Negative.   Psychiatric/Behavioral:  Positive for sleep disturbance. The patient is nervous/anxious.       Objective:   Physical Exam Constitutional:      Appearance: Normal appearance.  HENT:     Head: Normocephalic.     Mouth/Throat:     Mouth: Mucous membranes are moist.  Eyes:     General: No scleral icterus.    Pupils: Pupils are equal,  round, and reactive to light.  Cardiovascular:     Rate and Rhythm: Normal rate and regular rhythm.     Heart sounds: No murmur heard.    No friction rub.  Pulmonary:     Effort: No respiratory distress.     Breath sounds: No stridor. No wheezing or rhonchi.  Musculoskeletal:     Cervical back: No rigidity or tenderness.  Neurological:     Mental Status: She is alert.  Psychiatric:        Mood and Affect: Mood normal.    Vitals:   01/27/23 1055  BP: 112/70  Pulse: 66  SpO2: 98%      01/27/2023   10:00 AM 11/01/2019    1:00 PM  Results of the Epworth flowsheet  Sitting and reading 0 0  Watching TV 0 0  Sitting, inactive in a public place (e.g. a theatre or a meeting) 0 0  As a passenger in a car for an hour without a break 0 1  Lying down to rest in the afternoon when  circumstances permit 3 3  Sitting and talking to someone 0 0  Sitting quietly after a lunch without alcohol 0 0  In a car, while stopped for a few minutes in traffic 0 0  Total score 3 4   Previous sleep study was reviewed showing mild obstructive sleep apnea with oxygen desaturations    Assessment & Plan:   .  Nonrestorative sleep .  History of mild obstructive sleep apnea. -No intolerance to CPAP previously, did try for about 6 months -She is thinking about obtaining an oral device for the management of known sleep apnea  With intolerance to CPAP, an oral device may be a good option for mild sleep disordered breathing  Encouraged to continue working on weight loss efforts  Continue sleep aids as needed  Graded exercises recommended  Tentative follow-up in about 6 months  With improvement in sleep quality, continuous sleep-sleep is likely to feel more restorative  Sherrilyn Rist, MD [facility] Pager: See Shea Evans

## 2023-02-15 ENCOUNTER — Ambulatory Visit: Admitting: Cardiology

## 2023-03-28 ENCOUNTER — Telehealth: Payer: Self-pay | Admitting: Cardiology

## 2023-03-28 NOTE — Telephone Encounter (Signed)
New pt scheduled with Dr. Shari Prows on 6/12, she called reporting elevated HR of 113 while going up the stairs and resting HR 83 pt stated the resting heart rate is still elevated. She does have shortness of breath and fatigue while going up the stairs. Will forward to MD and nurse for advisse

## 2023-03-28 NOTE — Telephone Encounter (Signed)
STAT if HR is under 50 or over 120 (normal HR is 60-100 beats per minute)  What is your heart rate? 113 - going up the stairs   Do you have a log of your heart rate readings (document readings)?  Resting - 83    Do you have any other symptoms? Fatigue, sob when going up the stairs.  Pt states when going up the stairs she noticed her hr going up higher than normal and she got sob. She states she exercises a lot so this was unusual for her. Pt would like some advise on what to do. Please advise.

## 2023-03-29 NOTE — Telephone Encounter (Signed)
Lolene, Shams - 03/28/2023 12:42 PM Meriam Sprague, MD  Sent: Tue Mar 29, 2023 11:18 AM  To: Loa Socks, LPN         Message  Sounds great. Thank you so much for being so thorough with your call!

## 2023-03-29 NOTE — Telephone Encounter (Signed)
Cassandra Sprague, MD  Loa Socks, LPN Caller: Unspecified Burgess Estelle, 12:42 PM) Has she been having any other exertional symptoms? A heart rate of 113 alone when walking the stairs is not too concerning as the heart rate is meant to augment with activity BUT if she is having worsening SOB, chest discomfort etc with exertion, this is more concerning. Has she noticed any other exercise related symptoms or worsening exercise tolerance? If so, we can get her in sooner to discuss with a PA about possible ischemic testing/echo   Called the pt and discussed advisement as indicated above by Dr. Shari Prows.   She said she only had the one episode yesterday, where she was walking up stairs and got sob and HR- went to 113.  She said she felt mild discomfort in her chest at that time, but no alarming chest pain.  Pt states she walks everyday with no exertional symptoms at all, so the one episode yesterday just had her a bit worried.  Pt stated that she did get her walk in this morning and had zero cardiac complaints or exertional symptoms.  Offered the pt to come into the office to see an APP for further evaluation, and she said she will hold off for now and just keep her current follow-up appt with our office in a couple weeks, as scheduled.  She states that if symptoms return, she will call the office to let us know, so that we can bring her in for a sooner appt.   Informed her that I will make Dr. Shari Prows aware that she is doing great today and yesterday seemed to be an isolated event, and she will keep her appt as scheduled in June, and touch base with our office as needed.  Pt verbalized understanding and agrees with this plan.

## 2023-04-14 ENCOUNTER — Ambulatory Visit: Admitting: Nurse Practitioner

## 2023-04-15 ENCOUNTER — Ambulatory Visit (INDEPENDENT_AMBULATORY_CARE_PROVIDER_SITE_OTHER): Admitting: Podiatry

## 2023-04-15 DIAGNOSIS — M2041 Other hammer toe(s) (acquired), right foot: Secondary | ICD-10-CM | POA: Diagnosis not present

## 2023-04-15 DIAGNOSIS — M2042 Other hammer toe(s) (acquired), left foot: Secondary | ICD-10-CM

## 2023-04-15 DIAGNOSIS — L989 Disorder of the skin and subcutaneous tissue, unspecified: Secondary | ICD-10-CM

## 2023-04-15 NOTE — Progress Notes (Unsigned)
Subjective:   Patient ID: Cassandra Leach, female   DOB: 52 y.o.   MRN: [id number]   HPI Chief Complaint  Patient presents with   Callouses    Rm 11 Callus between right 4th and 5th toe. Discomfort with friction.  No drainage, bleeding or edema.    No injuries. No changes in activities or shoes.   ROS      Objective:  Physical Exam  ***     Assessment:  ***     Plan:  ***

## 2023-04-15 NOTE — Patient Instructions (Signed)
Keep the bandage on for 24 hours. At that time, remove and clean with soap and water. If it hurts or burns before 24 hours go ahead and remove the bandage and wash with soap and water. Keep the area clean. If there is any blistering cover with antibiotic ointment and a bandage. Monitor for any redness, drainage, or other signs of infection. Call the office if any are to occur. If you have any questions, please call the office at [phone].  

## 2023-04-18 ENCOUNTER — Ambulatory Visit (INDEPENDENT_AMBULATORY_CARE_PROVIDER_SITE_OTHER): Admitting: Family Medicine

## 2023-04-18 ENCOUNTER — Encounter: Payer: Self-pay | Admitting: Family Medicine

## 2023-04-18 VITALS — BP 110/74 | HR 61 | Temp 98.0°F | Resp 16 | Ht 65.0 in | Wt 160.4 lb

## 2023-04-18 DIAGNOSIS — R5383 Other fatigue: Secondary | ICD-10-CM | POA: Diagnosis not present

## 2023-04-18 LAB — COMPREHENSIVE METABOLIC PANEL
ALT: 10 U/L (ref 0–35)
AST: 15 U/L (ref 0–37)
Albumin: 4.3 g/dL (ref 3.5–5.2)
Alkaline Phosphatase: 35 U/L — ABNORMAL LOW (ref 39–117)
BUN: 14 mg/dL (ref 6–23)
CO2: 28 mEq/L (ref 19–32)
Calcium: 9.6 mg/dL (ref 8.4–10.5)
Chloride: 102 mEq/L (ref 96–112)
Creatinine, Ser: 0.81 mg/dL (ref 0.40–1.20)
GFR: 82.46 mL/min (ref >60.00)
Glucose, Bld: 94 mg/dL (ref 70–99)
Potassium: 4.2 mEq/L (ref 3.5–5.1)
Sodium: 138 mEq/L (ref 135–145)
Total Bilirubin: 0.4 mg/dL (ref 0.2–1.2)
Total Protein: 6.9 g/dL (ref 6.0–8.3)

## 2023-04-18 LAB — CBC WITH DIFFERENTIAL/PLATELET
Basophils Absolute: 0.1 10*3/uL (ref 0.0–0.1)
Basophils Relative: 1.1 % (ref 0.0–3.0)
Eosinophils Absolute: 0 10*3/uL (ref 0.0–0.7)
Eosinophils Relative: 0.8 % (ref 0.0–5.0)
HCT: 40.6 % (ref 36.0–46.0)
Hemoglobin: 13.4 g/dL (ref 12.0–15.0)
Lymphocytes Relative: 31.4 % (ref 12.0–46.0)
Lymphs Abs: 1.9 10*3/uL (ref 0.7–4.0)
MCHC: 33 g/dL (ref 30.0–36.0)
MCV: 90.5 fl (ref 78.0–100.0)
Monocytes Absolute: 0.4 10*3/uL (ref 0.1–1.0)
Monocytes Relative: 7.3 % (ref 3.0–12.0)
Neutro Abs: 3.6 10*3/uL (ref 1.4–7.7)
Neutrophils Relative %: 59.4 % (ref 43.0–77.0)
Platelets: 261 10*3/uL (ref 150.0–400.0)
RBC: 4.49 Mil/uL (ref 3.87–5.11)
RDW: 13.5 % (ref 11.5–15.5)
WBC: 6 10*3/uL (ref 4.0–10.5)

## 2023-04-18 LAB — VITAMIN B12: Vitamin B-12: 818 pg/mL (ref 211–911)

## 2023-04-18 LAB — TSH: TSH: 1.72 u[IU]/mL (ref 0.35–5.50)

## 2023-04-18 NOTE — Patient Instructions (Signed)
It was very nice to see you today!  Monitor symptoms.  Let me know if continues  For mammogram: [facility]- [phone] for mammogram,   PLEASE NOTE:  If you had any lab tests please let us know if you have not heard back within a few days. You may see your results on MyChart before we have a chance to review them but we will give you a call once they are reviewed by [place]. If we ordered any referrals today, please let us know if you have not heard from their office within the next week.   Please try these tips to maintain a healthy lifestyle:  Eat most of your calories during the day when you are active. Eliminate processed foods including packaged sweets (pies, cakes, cookies), reduce intake of potatoes, white bread, white pasta, and white rice. Look for whole grain options, oat flour or almond flour.  Each meal should contain half fruits/vegetables, one quarter protein, and one quarter carbs (no bigger than a computer mouse).  Cut down on sweet beverages. This includes juice, soda, and sweet tea. Also watch fruit intake, though this is a healthier sweet option, it still contains natural sugar! Limit to 3 servings daily.  Drink at least 1 glass of water with each meal and aim for at least 8 glasses per day  Exercise at least 150 minutes every week.

## 2023-04-18 NOTE — Progress Notes (Signed)
Subjective:     Patient ID: Cassandra Leach, female    DOB: January 23, 1971, 52 y.o.   MRN: [id number]  Chief Complaint  Patient presents with   Fatigue    Last cycle was in January, then had cycle started 5/24 Believes that perimenopause is to blame for recent fatigue     HPI Fatigue-irreg menses-period in Jan then 5/24. Fatigue for 10 days. Not heavy period. No dark stools. No energy.  Walking, losing weight, but now, no energy to go for walk.  No shortness of breath.  Sees Card this month(s)-bystolic for SVT. Heart rate high 40's first am.  Seeing psychiatry-feeling great. Sleeping well on restoril 15 mg. No hot flashes.last colon when 43.  No bleeding.  Doesn't donate blood.   Patient has appointment for gynecology in August.  Will schedule mamm.  Declines Tetanus, Diphtheria, and Pertussis (Tdap) for now.  Health Maintenance Due  Topic Date Due   DTaP/Tdap/Td (1 - Tdap) Never done   COVID-19 Vaccine (7 - 2023-24 season) 10/06/2022   PAP SMEAR-Modifier  11/12/2022   MAMMOGRAM  03/28/2023    Past Medical History:  Diagnosis Date   Anxiety    Asthma    Depression    History of migraine    No issue in 2 years   IBS (irritable bowel syndrome)    Insomnia    Mild sleep apnea    Prediabetes    SVT (supraventricular tachycardia)    Vitamin D deficiency     Past Surgical History:  Procedure Laterality Date   RADIOFREQUENCY ABLATION NERVES     neck     Current Outpatient Medications:    ARIPiprazole (ABILIFY) 15 MG tablet, Take 15 mg by mouth daily., Disp: , Rfl:    azelastine (ASTELIN) 0.1 % nasal spray, Place 1 spray into both nostrils 2 (two) times daily. Use in each nostril as directed, Disp: 30 mL, Rfl: 12   buPROPion (WELLBUTRIN XL) 300 MG 24 hr tablet, Take 300 mg by mouth daily., Disp: , Rfl:    Cholecalciferol (VITAMIN D3) 5000 units CAPS, Take 1 capsule by mouth daily., Disp: , Rfl:    meclizine (ANTIVERT) 12.5 MG tablet, Take 1 tablet (12.5 mg total) by mouth 3  (three) times daily as needed for dizziness., Disp: 30 tablet, Rfl: 0   meloxicam (MOBIC) 15 MG tablet, Take 1 tablet (15 mg total) by mouth daily., Disp: 90 tablet, Rfl: 1   nebivolol (BYSTOLIC) 5 MG tablet, Take 1 tablet (5 mg total) by mouth daily., Disp: 90 tablet, Rfl: 1   ondansetron (ZOFRAN) 4 MG tablet, Take 4 mg by mouth every 8 (eight) hours as needed for nausea or vomiting., Disp: , Rfl:    temazepam (RESTORIL) 30 MG capsule, Take 30 mg by mouth at bedtime as needed., Disp: , Rfl:    tiZANidine (ZANAFLEX) 4 MG tablet, TAKE 1 TABLET(4 MG) BY MOUTH EVERY 6 HOURS AS NEEDED FOR MUSCLE SPASMS, Disp: 30 tablet, Rfl: 1   TRINTELLIX 20 MG TABS tablet, Take 20 mg by mouth daily., Disp: , Rfl:   Allergies  Allergen Reactions   Neosporin [Bacitracin-Polymyxin B] Rash   Penicillins Nausea And Vomiting   Sulfa Antibiotics Nausea And Vomiting   ROS neg/noncontributory except as noted HPI/below  Occasional diarrhea-food related.  No chest pain.  Just the palpation and dyspnea on exertion from stairs in am only       Objective:     BP 110/74   Pulse 61  Temp 98 F (36.7 C) (Temporal)   Resp 16   Ht 5\' 5"  (1.651 m)   Wt 160 lb 6.4 oz (72.8 kg)   LMP 04/08/2023 (Approximate)   SpO2 96%   BMI 26.69 kg/m  Wt Readings from Last 3 Encounters:  04/18/23 160 lb 6.4 oz (72.8 kg)  01/27/23 172 lb 3.2 oz (78.1 kg)  01/21/23 165 lb (74.8 kg)    Physical Exam   Gen: WDWN NAD HEENT: NCAT, conjunctiva not injected, sclera nonicteric NECK:  supple, no thyromegaly, no nodes, no carotid bruits CARDIAC: RRR, S1S2+,+MSC no murmur. DP 2+B LUNGS: CTAB. No wheezes ABDOMEN:  BS+, soft, NTND, No HSM, no masses EXT:  no edema MSK: no gross abnormalities.  NEURO: A&O x3.  CN II-XII intact.  PSYCH: normal mood. Good eye contact     Assessment & Plan:  Other fatigue -     CBC with Differential/Platelet -     Comprehensive metabolic panel -     Iron, TIBC and Ferritin Panel -     TSH -      Vitamin B12   Fatigue-some better today.  ?viral, anemia, medications, other.  Doubt depression.  Possibly low heart rate from beta blocker.  Will see card soon.  Will check labs and monitor.    No follow-ups on file.  Angelena Sole, MD

## 2023-04-18 NOTE — Progress Notes (Unsigned)
  Cardiology Office Note:   Date:  04/18/2023  ID:  Cassandra Leach, DOB 07/24/1971, MRN [id number]  History of Present Illness:   BUFORD GAYLER is a 52 y.o. female with history of anxiety, depression, asthma and SVT who presents to clinic for follow-up for SVT.  Patient with history of SVT previously followed by Dr. Delton See. Did not tolerate metoprolol but has been doing well on bystolic. TTE in 2017 with EF 60-65%, mild MR.  Last seen by Chelsea Aus in 01/2022 where she was doing well.   Today, the patient states that she is overall doing okay. States that about 3 weeks ago she was walking up the stairs where her HR went up to 113bpm with associated palpitations prompting her visit today. Notably, she had not eaten anything before this episde. Since that time, she states her heart rate has increased with the stairs but no symptoms. No chest pain, SOB, orthopnea, PND or LE edema. She is walking regularly without issues. Bystolic has worked well with her at this current dose as she had breakthrough palpitations at 2.5mg  daily.  Past Medical History:  Diagnosis Date   Anxiety    Asthma    Depression    History of migraine    No issue in 2 years   IBS (irritable bowel syndrome)    Insomnia    Mild sleep apnea    Prediabetes    SVT (supraventricular tachycardia)    Vitamin D deficiency      ROS: As per HPI  Studies Reviewed:    EKG:  Sinus bradycardia with HR 54-personally reviewed       Risk Assessment/Calculations:              Physical Exam:   VS:  LMP 04/08/2023 (Approximate)    Wt Readings from Last 3 Encounters:  04/18/23 160 lb 6.4 oz (72.8 kg)  01/27/23 172 lb 3.2 oz (78.1 kg)  01/21/23 165 lb (74.8 kg)     GEN: Well nourished, well developed in no acute distress NECK: No JVD; No carotid bruits CARDIAC: Bradycardic, regular, no murmurs, rubs, gallops RESPIRATORY:  Clear to auscultation without rales, wheezing or rhonchi  ABDOMEN: Soft, non-tender,  non-distended EXTREMITIES:  No edema; No deformity   ASSESSMENT AND PLAN:   #SVT: #Palpitations: -Had isolated episode episode of palpitations but has not recurred -Given absence of symptoms currently, will monitor for now; can pursue cardiac monitor in future if recurs -TSH normal, K 4.2, Hgb normal -Remains hydrated -Continue bystolic 5mg  daily; had breakthrough tachycardia on 2.5mg  dose  #OSA: -Did not tolerate CPAP; wokring on weight loss  #HLD: -LDL 107; not on statin therapy -Check Ca score        Signed, Meriam Sprague, MD

## 2023-04-19 LAB — IRON,TIBC AND FERRITIN PANEL
%SAT: 28 % (calc) (ref 16–45)
Ferritin: 76 ng/mL (ref 16–232)
Iron: 71 ug/dL (ref 45–160)
TIBC: 255 mcg/dL (calc) (ref 250–450)

## 2023-04-19 NOTE — Progress Notes (Signed)
Labs all normal.  Not sure what caused fatigue-possibly virus.  Let me know if continues/doesn't improve

## 2023-04-27 ENCOUNTER — Encounter: Payer: Self-pay | Admitting: Cardiology

## 2023-04-27 ENCOUNTER — Ambulatory Visit: Attending: Cardiology | Admitting: Cardiology

## 2023-04-27 VITALS — BP 92/70 | HR 54 | Ht 65.0 in | Wt 165.4 lb

## 2023-04-27 DIAGNOSIS — I471 Supraventricular tachycardia, unspecified: Secondary | ICD-10-CM | POA: Diagnosis not present

## 2023-04-27 DIAGNOSIS — E785 Hyperlipidemia, unspecified: Secondary | ICD-10-CM | POA: Diagnosis not present

## 2023-04-27 DIAGNOSIS — G4733 Obstructive sleep apnea (adult) (pediatric): Secondary | ICD-10-CM | POA: Diagnosis not present

## 2023-04-27 DIAGNOSIS — R002 Palpitations: Secondary | ICD-10-CM | POA: Diagnosis not present

## 2023-04-27 MED ORDER — NEBIVOLOL HCL 5 MG PO TABS
5.0000 mg | ORAL_TABLET | Freq: Every day | ORAL | 4 refills | Status: DC
Start: 2023-04-27 — End: 2024-05-10

## 2023-04-27 NOTE — Patient Instructions (Signed)
Medication Instructions:   Your physician recommends that you continue on your current medications as directed. Please refer to the Current Medication list given to you today.  *If you need a refill on your cardiac medications before your next appointment, please call your pharmacy*    Testing/Procedures:  CARDIAC CALCIUM SCORE (SELF PAY)   Follow-Up: At [facility], you and your health needs are our priority.  As part of our continuing mission to provide you with exceptional heart care, we have created designated Provider Care Teams.  These Care Teams include your primary Cardiologist (physician) and Advanced Practice Providers (APPs -  Physician Assistants and Nurse Practitioners) who all work together to provide you with the care you need, when you need it.  We recommend signing up for the patient portal called "MyChart".  Sign up information is provided on this After Visit Summary.  MyChart is used to connect with patients for Virtual Visits (Telemedicine).  Patients are able to view lab/test results, encounter notes, upcoming appointments, etc.  Non-urgent messages can be sent to your provider as well.   To learn more about what you can do with MyChart, go to [email or web address].    Your next appointment:   1 year(s)  Provider:   Jodelle Red, MD

## 2023-05-09 ENCOUNTER — Other Ambulatory Visit: Payer: Self-pay | Admitting: Family Medicine

## 2023-05-10 ENCOUNTER — Other Ambulatory Visit: Payer: Self-pay | Admitting: Nurse Practitioner

## 2023-05-10 DIAGNOSIS — N6320 Unspecified lump in the left breast, unspecified quadrant: Secondary | ICD-10-CM

## 2023-05-18 ENCOUNTER — Other Ambulatory Visit (HOSPITAL_BASED_OUTPATIENT_CLINIC_OR_DEPARTMENT_OTHER)

## 2023-05-18 ENCOUNTER — Telehealth: Payer: Self-pay | Admitting: Pulmonary Disease

## 2023-05-18 NOTE — Telephone Encounter (Signed)
PT was called from the recall list to set up FU appt w/Dr. Val Eagle. States she is feeling better and dcln to sched. I advised we'd let Dr. Val Eagle know. Thank you.

## 2023-05-27 ENCOUNTER — Ambulatory Visit: Admission: RE | Admit: 2023-05-27 | Source: Ambulatory Visit

## 2023-05-27 ENCOUNTER — Ambulatory Visit
Admission: RE | Admit: 2023-05-27 | Discharge: 2023-05-27 | Disposition: A | Discharge status: Home / Self Care | Source: Ambulatory Visit | Attending: Nurse Practitioner | Admitting: Nurse Practitioner

## 2023-05-27 DIAGNOSIS — N6320 Unspecified lump in the left breast, unspecified quadrant: Secondary | ICD-10-CM

## 2023-06-20 ENCOUNTER — Ambulatory Visit: Admitting: Nurse Practitioner

## 2023-06-20 VITALS — BP 112/74 | HR 62 | Ht 65.0 in | Wt 158.4 lb

## 2023-06-20 DIAGNOSIS — N926 Irregular menstruation, unspecified: Secondary | ICD-10-CM | POA: Diagnosis not present

## 2023-06-20 DIAGNOSIS — Z01419 Encounter for gynecological examination (general) (routine) without abnormal findings: Secondary | ICD-10-CM

## 2023-06-20 NOTE — Progress Notes (Signed)
   Cassandra Leach December 02, 1970 [id number]   History:  52 y.o. G0 presents for annual exam. Menses have become irregular. Menses in January and June. Denies menopausal symptoms. Cryosurgery in early 20s, normal paps since. HTN managed by PCP. Anxiety/depression managed psychology. SVT managed by cardiology. Down 40 pounds with diet and exercise.   Gynecologic History Patient's last menstrual period was 05/05/2023.   Contraception/Family planning: vasectomy Sexually active: Yes  Health Maintenance Last Pap: 11/13/2019. Results were: Normal neg HPV, 5-year repeat Last mammogram: 05/27/2023. Results were: Stable cluster of cysts in left breast Last colonoscopy: at age 52, 10-year recall Last Dexa: Not indicated  Past medical history, past surgical history, family history and social history were all reviewed and documented in the EPIC chart. Married. Used to be Tourist information centre manager.   ROS:  A ROS was performed and pertinent positives and negatives are included.  Exam:  Vitals:   06/20/23 1336  BP: 112/74  Pulse: 62  SpO2: 100%  Weight: 158 lb 6.4 oz (71.8 kg)  Height: 5\' 5"  (1.651 m)   Body mass index is 26.36 kg/m.  General appearance:  Normal Thyroid:  Symmetrical, normal in size, without palpable masses or nodularity. Respiratory  Auscultation:  Clear without wheezing or rhonchi Cardiovascular  Auscultation:  Regular rate, without rubs, murmurs or gallops  Edema/varicosities:  Not grossly evident Abdominal  Soft,nontender, without masses, guarding or rebound.  Liver/spleen:  No organomegaly noted  Hernia:  None appreciated  Skin  Inspection:  Grossly normal Breasts: Examined lying and sitting.   Right: Without masses, retractions, nipple discharge or axillary adenopathy.   Left: Without masses, retractions, nipple discharge or axillary adenopathy. Genitourinary   Inguinal/mons:  Normal without inguinal adenopathy  External genitalia:  Normal appearing vulva with no  masses, tenderness, or lesions  BUS/Urethra/Skene's glands:  Normal  Vagina:  Normal appearing with normal color and discharge, no lesions  Cervix:  Normal appearing without discharge or lesions  Uterus:  Normal in size, shape and contour.  Midline and mobile, nontender  Adnexa/parametria:     Rt: Normal in size, without masses or tenderness.   Lt: Normal in size, without masses or tenderness.  Anus and perineum: Normal  Digital rectal exam: Deferred  Patient informed chaperone available to be present for breast and pelvic exam. Patient has requested no chaperone to be present. Patient has been advised what will be completed during breast and pelvic exam.   Assessment/Plan:  52 y.o. G0 for annual exam for annual exam.   Well female exam with routine gynecological exam - Education provided on SBEs, importance of preventative screenings, current guidelines, high calcium diet, regular exercise, and multivitamin daily.  Labs with PCP.  Menstrual changes - cycles have become irregular. Cycle in January and June. Denies menopausal symptoms. Discussed perimenopause and what to expect.   Screening for cervical cancer - Normal Pap history.  Will repeat at 5-year interval per guidelines.  Screening for breast cancer - Stable cluster of cysts in left breast x 2 years. Back to annual screenings.  Normal breast exam today.  Screening for colon cancer - Colonoscopy at age 27. Planning to do Cologuard next year.   Screening for osteoporosis - Average risk. Will plan DXA at age 52.   Return in 1 year for annual or sooner if needed.     Olivia Mackie DNP, 2:06 PM 06/20/2023

## 2023-07-05 ENCOUNTER — Other Ambulatory Visit: Payer: Self-pay | Admitting: Family Medicine

## 2023-10-10 ENCOUNTER — Other Ambulatory Visit: Payer: Self-pay | Admitting: Family Medicine

## 2023-12-07 ENCOUNTER — Other Ambulatory Visit: Payer: Self-pay | Admitting: Family Medicine

## 2023-12-27 ENCOUNTER — Ambulatory Visit: Admitting: Family Medicine

## 2023-12-27 ENCOUNTER — Encounter: Payer: Self-pay | Admitting: Family Medicine

## 2023-12-27 VITALS — BP 125/85 | HR 63 | Temp 97.4°F | Resp 16 | Ht 65.0 in | Wt 139.1 lb

## 2023-12-27 DIAGNOSIS — Z0000 Encounter for general adult medical examination without abnormal findings: Secondary | ICD-10-CM

## 2023-12-27 DIAGNOSIS — Z1211 Encounter for screening for malignant neoplasm of colon: Secondary | ICD-10-CM

## 2023-12-27 DIAGNOSIS — Z23 Encounter for immunization: Secondary | ICD-10-CM | POA: Diagnosis not present

## 2023-12-27 LAB — CBC WITH DIFFERENTIAL/PLATELET
Basophils Absolute: 0.1 10*3/uL (ref 0.0–0.1)
Basophils Relative: 2.1 % (ref 0.0–3.0)
Eosinophils Absolute: 0.1 10*3/uL (ref 0.0–0.7)
Eosinophils Relative: 1.8 % (ref 0.0–5.0)
HCT: 41 % (ref 36.0–46.0)
Hemoglobin: 13.7 g/dL (ref 12.0–15.0)
Lymphocytes Relative: 36.3 % (ref 12.0–46.0)
Lymphs Abs: 1.6 10*3/uL (ref 0.7–4.0)
MCHC: 33.3 g/dL (ref 30.0–36.0)
MCV: 92.2 fL (ref 78.0–100.0)
Monocytes Absolute: 0.3 10*3/uL (ref 0.1–1.0)
Monocytes Relative: 6.6 % (ref 3.0–12.0)
Neutro Abs: 2.3 10*3/uL (ref 1.4–7.7)
Neutrophils Relative %: 53.2 % (ref 43.0–77.0)
Platelets: 229 10*3/uL (ref 150.0–400.0)
RBC: 4.45 Mil/uL (ref 3.87–5.11)
RDW: 13 % (ref 11.5–15.5)
WBC: 4.4 10*3/uL (ref 4.0–10.5)

## 2023-12-27 LAB — COMPREHENSIVE METABOLIC PANEL
ALT: 16 U/L (ref 0–35)
AST: 18 U/L (ref 0–37)
Albumin: 4.6 g/dL (ref 3.5–5.2)
Alkaline Phosphatase: 31 U/L — ABNORMAL LOW (ref 39–117)
BUN: 16 mg/dL (ref 6–23)
CO2: 30 meq/L (ref 19–32)
Calcium: 9.4 mg/dL (ref 8.4–10.5)
Chloride: 104 meq/L (ref 96–112)
Creatinine, Ser: 0.72 mg/dL (ref 0.40–1.20)
GFR: 94.51 mL/min (ref >60.00)
Glucose, Bld: 99 mg/dL (ref 70–99)
Potassium: 4.3 meq/L (ref 3.5–5.1)
Sodium: 140 meq/L (ref 135–145)
Total Bilirubin: 0.5 mg/dL (ref 0.2–1.2)
Total Protein: 7.3 g/dL (ref 6.0–8.3)

## 2023-12-27 LAB — LIPID PANEL
Cholesterol: 184 mg/dL (ref 0–200)
HDL: 57.6 mg/dL (ref >39.00)
LDL Cholesterol: 116 mg/dL — ABNORMAL HIGH (ref 0–99)
NonHDL: 126.79
Total CHOL/HDL Ratio: 3
Triglycerides: 52 mg/dL (ref 0.0–149.0)
VLDL: 10.4 mg/dL (ref 0.0–40.0)

## 2023-12-27 LAB — TSH: TSH: 1.7 u[IU]/mL (ref 0.35–5.50)

## 2023-12-27 LAB — HEMOGLOBIN A1C: Hgb A1c MFr Bld: 5.4 % (ref 4.6–6.5)

## 2023-12-27 MED ORDER — TIZANIDINE HCL 4 MG PO TABS: 4.0000 mg | ORAL_TABLET | Freq: Three times a day (TID) | ORAL | 1 refills | Status: DC | PRN

## 2023-12-27 NOTE — Progress Notes (Signed)
Phone [phone]   Subjective:   Patient is a 53 y.o. female presenting for annual physical.    Chief Complaint  Patient presents with   Annual Exam    CPE Fasting Mild hot flashes  Colon in [place] at 53 yo-normal per pt-SIBO Annual-working hard on diet/exercise  Discussed the use of AI scribe software for clinical note transcription with the patient, who gave verbal consent to proceed.  History of Present Illness   Cassandra Leach is a 53 year old female who presents for a routine follow-up visit.  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 She is scheduled to see a specialist in March for further evaluation and potential surgical intervention.  She experiences chronic neck pain and is currently taking tizanidine and meloxicam for management. She uses tizanidine more frequently, typically once a day, sometimes twice, due to concerns about meloxicam's gastrointestinal side effects. Despite undergoing a nerve ablation, the neck pain persists. No major headaches, migraines, dizziness, or sore throat.  She has a history of depression and is on multiple medications including Abilify, Wellbutrin, and Trintellix. She attempted to reduce her Abilify dosage but experienced worsening depression, necessitating a return to her previous dose. She also takes Restoril for sleep, having reduced the dose from 30 mg to 15 mg, but was unable to discontinue it due to insomnia.  She is experiencing mild hot flashes as part of her perimenopausal symptoms. She reports irregular menstrual cycles, with periods occurring twice in December, none in January, and mild hot flashes primarily at night, causing sleep disturbances.  She has a history of supraventricular tachycardia (SVT) and is on Bystolic for management. She reports no current issues with heart racing or skipping beats.  She has a history of prediabetes and high cholesterol. She has been following a Mediterranean diet and  has lost 26 pounds since June 2024, now weighing 139 pounds. She is hopeful that these lifestyle changes will improve her prediabetes and cholesterol levels.  She reports occasional constipation, which she manages with magnesium pills as needed. No vomiting or diarrhea.  She has a history of a fatty liver and reports two instances of vomiting after consuming alcohol last year, which she attributes to her medication interactions.       See problem oriented charting- ROS- ROS: Gen: no fever, chills  Skin: no rash, itching ENT: no ear pain, ear drainage, nasal congestion, rhinorrhea, sinus pressure, sore throat Eyes: no blurry vision, double vision Resp: no cough, wheeze,SOB CV: no CP, palpitations, LE edema,  GI: no heartburn, n/v/d/c, abd pain GU: no dysuria, urgency, frequency, hematuria MSK: no joint pain, myalgias Neuro: no dizziness, headache, weakness, vertigo Psych: HPI.  Doing well.  No SI  The following were reviewed and entered/updated in epic: Past Medical History:  Diagnosis Date   Anxiety    Asthma    Depression    History of migraine    No issue in 2 years   IBS (irritable bowel syndrome)    Insomnia    Mild sleep apnea    Prediabetes    SVT (supraventricular tachycardia) (HCC)    Vitamin D deficiency    Patient Active Problem List   Diagnosis Date Noted   Hypertension 11/13/2019   Small intestinal bacterial overgrowth 07/09/2019   Cervicalgia 10/02/2018   Primary insomnia 07/19/2017   SVT (supraventricular tachycardia) (HCC) 04/01/2017   Generalized anxiety disorder 04/01/2017   Depression, major, single episode, 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     azelastine (ASTELIN) 0.1 % nasal spray Place 1 spray into both nostrils 2 (two) times daily. Use in each nostril as directed 30 mL 12   buPROPion (WELLBUTRIN XL) 300 MG 24 hr tablet Take 300 mg by mouth daily.     Cholecalciferol (VITAMIN D3) 5000 units CAPS Take 1 capsule by mouth daily.     meloxicam (MOBIC) 15 MG tablet TAKE 1 TABLET(15 MG) BY MOUTH DAILY 90 tablet 1   nebivolol (BYSTOLIC) 5 MG tablet Take 1 tablet (5 mg total) by mouth daily. 90 tablet 4   temazepam (RESTORIL) 30 MG capsule Take 30 mg by mouth at bedtime as needed.     TRINTELLIX 20 MG TABS tablet Take 20 mg by mouth daily.     tiZANidine (ZANAFLEX) 4 MG tablet Take 1 tablet (4 mg total) by mouth every 8 (eight) hours as needed for muscle spasms. 90 tablet 1   No current facility-administered medications for this visit.    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 DP 2+B LUNGS: CTAB. No wheezes ABDOMEN:  BS+, soft, NTND, No HSM, no masses EXT:  no edema MSK: no gross abnormalities. MS 5/5 all 4 NEURO: A&O x3.  CN II-XII intact.  PSYCH: normal mood. Good eye contact     Assessment and Plan   Health Maintenance counseling: 1. Anticipatory guidance: Patient counseled  regarding regular dental exams q6 months, eye exams,  avoiding smoking and second hand smoke, limiting alcohol to 1 beverage per day, no illicit drugs.   2. Risk factor reduction:  Advised patient of need for regular exercise and diet rich and fruits and vegetables to reduce risk of heart attack and stroke. Exercise- +.  Wt Readings from Last 3 Encounters:  12/27/23 139 lb 2 oz (63.1 kg)  06/20/23 158 lb 6.4 oz (71.8 kg)  04/27/23 165 lb 6.4 oz (75 kg)   3. Immunizations/screenings/ancillary studies Immunization History  Administered Date(s) Administered   Influenza Inj Mdck Quad Pf 08/09/2022   Influenza, Seasonal, Injecte, Preservative Fre 12/27/2023   Influenza,inj,Quad PF,6+ Mos 08/16/2017, 08/18/2018, 08/14/2019, 09/03/2020   Influenza-Unspecified 08/14/2019, 10/04/2021, 08/11/2022   PFIZER(Purple Top)SARS-COV-2 Vaccination 02/03/2020, 02/24/2020, 10/10/2020, 03/09/2021, 07/28/2021   Pfizer(Comirnaty)Fall Seasonal Vaccine 12 years and older 08/11/2022, 08/20/2023   Zoster Recombinant(Shingrix) 08/14/2019  Zoster, Live 10/16/2019   Zoster, Unspecified 10/16/2019   Health Maintenance Due  Topic Date Due   Colonoscopy  11/29/2023    4. Cervical cancer screening- utd 5. Breast cancer screening-  mammogram utd 6. Colon cancer screening - ordered 7. Skin cancer screening- advised regular sunscreen use. Denies worrisome, changing, or new skin lesions.  8. Birth control/STD check- n/a 9. Osteoporosis screening- n/a 10. Smoking associated screening - non smoker  Wellness examination -     CBC with Differential/Platelet -     Comprehensive metabolic panel -     Lipid panel -     TSH -     Hemoglobin A1c  Need for influenza vaccination -     Flu vaccine trivalent PF, 6mos and older(Flulaval,Afluria,Fluarix,Fluzone)  Screening for colorectal cancer -     Cologuard  Other orders -     tiZANidine HCl; Take 1 tablet (4 mg total) by mouth every 8 (eight) hours as needed for muscle  spasms.  Dispense: 90 tablet; Refill: 1   Wellness-anticipatory guidance.  Work on Diet/Exercise  Check CBC,CMP,lipids,TSH, A1C.  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 A specialist appointment is scheduled in March for potential surgical intervention. Continue current management and follow up with the specialist as planned.  Chronic Neck Pain Chronic neck pain is managed with tizanidine and meloxicam. Despite nerve ablation, pain persists. Tizanidine is preferred due to meloxicam's gastrointestinal side effects. Refill tizanidine with an increased quantity and continue meloxicam as needed.  Depression Depression is managed with Abilify, Wellbutrin, and Trintellix. Recent adjustments in Abilify dosage were made due to increased symptoms around Christmas. Currently, symptoms are well-managed on 15 mg of Abilify. Continue current medications and therapy sessions.  Perimenopause Experiencing mild hot flashes, with a preference to avoid hormone replacement therapy due to potential risks. Cardiovascular risks, including increased risk post-menopause, were discussed. Monitor symptoms and continue current management without hormone replacement therapy.  Supraventricular Tachycardia (SVT) SVT is managed with Bystolic, with no current issues reported. Continue Bystolic.  Fatty Liver Fatty liver is noted with vomiting after alcohol consumption, likely due to interaction with psych medications. Avoid alcohol consumption.  Pre-Diabetes Pre-diabetes shows improved A1c levels, following a Mediterranean diet. Previous A1c was 6, now improved to 5.7. Order blood work to check A1c and cholesterol levels and continue the Mediterranean diet.  Weight Loss and Maintenance Lost 26 pounds since June 2024, now weighing 139 pounds. Actively maintaining weight loss through diet and exercise. Discussed strategies for weight maintenance, including not  eating after dinner and monitoring weight fluctuations. Continue current diet and exercise regimen and monitor weight regularly.  General Health Maintenance Due for several screenings and vaccinations. Discussed options for colon cancer screening, including the Cologuard test and its frequency. Pap smear is due this year, mammogram is up to date, and tetanus shot is due in April. Order Cologuard test, schedule Pap smear, ensure mammogram is up to date, and administer tetanus shot at the next visit.  Follow-up Order blood work today. Plan for an annual follow-up unless issues arise.        Recommended follow up: Return in about 1 year (around 12/26/2024) for annual physical.  Lab/Order associations:+ fasting  Cassandra Sole, MD

## 2023-12-27 NOTE — Patient Instructions (Signed)
It was very nice to see you today!  Keep up the great work!   PLEASE NOTE:  If you had any lab tests please let us know if you have not heard back within a few days. You may see your results on MyChart before we have a chance to review them but we will give you a call once they are reviewed by [place]. If we ordered any referrals today, please let us know if you have not heard from their office within the next week.   Please try these tips to maintain a healthy lifestyle:  Eat most of your calories during the day when you are active. Eliminate processed foods including packaged sweets (pies, cakes, cookies), reduce intake of potatoes, white bread, white pasta, and white rice. Look for whole grain options, oat flour or almond flour.  Each meal should contain half fruits/vegetables, one quarter protein, and one quarter carbs (no bigger than a computer mouse).  Cut down on sweet beverages. This includes juice, soda, and sweet tea. Also watch fruit intake, though this is a healthier sweet option, it still contains natural sugar! Limit to 3 servings daily.  Drink at least 1 glass of water with each meal and aim for at least 8 glasses per day  Exercise at least 150 minutes every week.

## 2023-12-28 ENCOUNTER — Encounter: Payer: Self-pay | Admitting: Family Medicine

## 2023-12-28 NOTE — Progress Notes (Signed)
Labs awesome!  Keep up the great work

## 2024-01-07 LAB — COLOGUARD: COLOGUARD: NEGATIVE

## 2024-03-06 ENCOUNTER — Other Ambulatory Visit: Payer: Self-pay

## 2024-03-08 LAB — SURGICAL PATHOLOGY

## 2024-05-10 ENCOUNTER — Telehealth: Payer: Self-pay | Admitting: Family

## 2024-05-10 DIAGNOSIS — I471 Supraventricular tachycardia, unspecified: Secondary | ICD-10-CM

## 2024-05-10 DIAGNOSIS — E785 Hyperlipidemia, unspecified: Secondary | ICD-10-CM

## 2024-05-10 DIAGNOSIS — G4733 Obstructive sleep apnea (adult) (pediatric): Secondary | ICD-10-CM

## 2024-05-10 DIAGNOSIS — R002 Palpitations: Secondary | ICD-10-CM

## 2024-05-10 MED ORDER — NEBIVOLOL HCL 5 MG PO TABS
5.0000 mg | ORAL_TABLET | Freq: Every day | ORAL | 0 refills | Status: DC
Start: 2024-05-10 — End: 2024-08-13

## 2024-05-10 NOTE — Telephone Encounter (Signed)
 Pt's medication was sent to pt's pharmacy as requested. Confirmation received.

## 2024-05-10 NOTE — Telephone Encounter (Signed)
*  STAT* If patient is at the pharmacy, call can be transferred to refill team.   1. Which medications need to be refilled? (please list name of each medication and dose if known) nebivolol  (BYSTOLIC ) 5 MG tablet   2. Which pharmacy/location (including street and city if local pharmacy) is medication to be sent to?  [facility] - [place], [place] - [address]   3. Do they need a 30 day or 90 day supply? 90

## 2024-05-16 ENCOUNTER — Ambulatory Visit: Admitting: Podiatry

## 2024-05-21 ENCOUNTER — Ambulatory Visit: Admitting: Family Medicine

## 2024-05-21 VITALS — BP 128/84 | HR 61 | Temp 97.8°F | Resp 16 | Ht 65.0 in | Wt 144.4 lb

## 2024-05-21 DIAGNOSIS — F5101 Primary insomnia: Secondary | ICD-10-CM

## 2024-05-21 MED ORDER — QUVIVIQ 25 MG PO TABS: 25.0000 mg | ORAL_TABLET | Freq: Every evening | ORAL | 1 refills | Status: DC

## 2024-05-21 NOTE — Patient Instructions (Addendum)
 It was very nice to see you today!  Try meditation   Will try the quviviq  or belsomra .  Fairlife chocolate milk   PLEASE NOTE:  If you had any lab tests please let us  know if you have not heard back within a few days. You may see your results on MyChart before we have a chance to review them but we will give you a call once they are reviewed by us . If we ordered any referrals today, please let us  know if you have not heard from their office within the next week.   Please try these tips to maintain a healthy lifestyle:  Eat most of your calories during the day when you are active. Eliminate processed foods including packaged sweets (pies, cakes, cookies), reduce intake of potatoes, white bread, white pasta, and white rice. Look for whole grain options, oat flour or almond flour.  Each meal should contain half fruits/vegetables, one quarter protein, and one quarter carbs (no bigger than a computer mouse).  Cut down on sweet beverages. This includes juice, soda, and sweet tea. Also watch fruit intake, though this is a healthier sweet option, it still contains natural sugar! Limit to 3 servings daily.  Drink at least 1 glass of water with each meal and aim for at least 8 glasses per day  Exercise at least 150 minutes every week.

## 2024-05-21 NOTE — Progress Notes (Signed)
 Subjective:     Patient ID: Cassandra Leach, female    DOB: 03-Jun-1971, 53 y.o.   MRN: [id number]  Chief Complaint  Patient presents with   Insomnia    Having trouble sleeping, doesn't feel like current medication are working anymore    HPI Quviviq  Discussed the use of AI scribe software for clinical note transcription with the patient, who gave verbal consent to proceed.  History of Present Illness Cassandra Leach is a 53 year old female who presents with difficulty sleeping despite medication use.  She has experienced difficulty sleeping for approximately ten years, initially starting when she lived in [place] . She has been taking Restoril  for about four to five years at a dose of 30 mg, but it is no longer effective.  She has a history of using Ambien  and Lunesta, which did not significantly improve her sleep and caused nocturnal eating with amnesia. She also tried trazodone  in the past but does not recall its effects.  Her primary issue is difficulty falling and staying asleep. She has implemented sleep hygiene practices, including maintaining a dark room, using a noise machine, and turning off the TV before bed, but continues to struggle with sleep.  When unable to sleep, she often gets up to eat, contributing to weight gain. She feels 'drugged' on Restoril  and experiences episodes of eating without memory of the event.  Psych decreased restoril  but insomnia worsened so back up to 30mg .   Her current medications include Restoril  30 mg for sleep, Wellbutrin  increased from 300 mg to 450 mg, Abilify  15 mg, and Trintellix  20 mg. She has not yet decided to take the increased dose of Wellbutrin . She also takes tizanidine , which sometimes makes her sleepy during the day but not at night.  No suicidal ideation is present, but she notes a decrease in mood. She is trying to maintain her weight through exercise but finds it challenging due to nighttime eating. She would like to try Quviviq      Health Maintenance Due  Topic Date Due   Hepatitis B Vaccines (1 of 3 - 19+ 3-dose series) Never done    Past Medical History:  Diagnosis Date   Anxiety    Asthma    Depression    History of migraine    No issue in 2 years   IBS (irritable bowel syndrome)    Insomnia    Mild sleep apnea    Prediabetes    SVT (supraventricular tachycardia) (HCC)    Vitamin D  deficiency     Past Surgical History:  Procedure Laterality Date   RADIOFREQUENCY ABLATION NERVES     neck     Current Outpatient Medications:    ARIPiprazole  (ABILIFY ) 15 MG tablet, Take 15 mg by mouth daily., Disp: , Rfl:    azelastine  (ASTELIN ) 0.1 % nasal spray, Place 1 spray into both nostrils 2 (two) times daily. Use in each nostril as directed, Disp: 30 mL, Rfl: 12   buPROPion  (WELLBUTRIN  XL) 300 MG 24 hr tablet, Take 300 mg by mouth daily., Disp: , Rfl:    Cholecalciferol (VITAMIN D3) 5000 units CAPS, Take 1 capsule by mouth daily., Disp: , Rfl:    Daridorexant  HCl (QUVIVIQ ) 25 MG TABS, Take 1 tablet (25 mg total) by mouth at bedtime., Disp: 30 tablet, Rfl: 1   meloxicam  (MOBIC ) 15 MG tablet, TAKE 1 TABLET(15 MG) BY MOUTH DAILY, Disp: 90 tablet, Rfl: 1   nebivolol  (BYSTOLIC ) 5 MG tablet, Take 1 tablet (5 mg total)  by mouth daily., Disp: 90 tablet, Rfl: 0   temazepam  (RESTORIL ) 30 MG capsule, Take 30 mg by mouth at bedtime as needed., Disp: , Rfl:    tiZANidine  (ZANAFLEX ) 4 MG tablet, Take 1 tablet (4 mg total) by mouth every 8 (eight) hours as needed for muscle spasms., Disp: 90 tablet, Rfl: 1   TRINTELLIX  20 MG TABS tablet, Take 20 mg by mouth daily., Disp: , Rfl:   Allergies  Allergen Reactions   Bacitracin Dermatitis   Polymyxin B Dermatitis   Neomycin Dermatitis   Neosporin [Bacitracin-Polymyxin B] Rash   Penicillins Nausea And Vomiting and Nausea Only   Sulfa Antibiotics Nausea And Vomiting and Nausea Only   ROS neg/noncontributory except as noted HPI/below      Objective:     BP 128/84    Pulse 61   Temp 97.8 F (36.6 C) (Temporal)   Resp 16   Ht 5' 5 (1.651 m)   Wt 144 lb 6 oz (65.5 kg)   SpO2 98%   BMI 24.03 kg/m  Wt Readings from Last 3 Encounters:  05/21/24 144 lb 6 oz (65.5 kg)  12/27/23 139 lb 2 oz (63.1 kg)  06/20/23 158 lb 6.4 oz (71.8 kg)    Physical Exam   Gen: WDWN NAD HEENT: NCAT, conjunctiva not injected, sclera nonicteric CARDIAC: RRR, S1S2+, no murmur.  EXT:  no edema MSK: no gross abnormalities.  NEURO: A&O x3.  CN II-XII intact.  PSYCH: normal mood. Good eye contact  Pdmp checked    Assessment & Plan:  Primary insomnia  Other orders -     Quviviq ; Take 1 tablet (25 mg total) by mouth at bedtime.  Dispense: 30 tablet; Refill: 1  Assessment and Plan Assessment & Plan Chronic Insomnia   She has experienced chronic insomnia for over ten years, struggling to stay asleep. Previous treatments with Ambien  and Lunesta were ineffective and caused adverse effects like nocturnal eating and amnesia. Currently, she takes Restoril  (temazepam ) 30 mg, which is no longer effective and causes hangover effects and nocturnal eating. Attempts to taper Restoril  to 22.5 mg worsened her sleep. Sleep hygiene measures are in place. A switch to a medication targeting the wake cycle, such as Quviviq  or Belsomra , is under consideration pending insurance authorization. These medications inhibit the awake cycle, offering a different approach to managing insomnia. Authorization may require trying Belsomra  first if Quviviq  is not approved. Submit prior authorization for Quviviq  or Belsomra . Maintain the current Restoril  dose until the new medication is approved and effective. Consider tapering Restoril  once the new medication is effective. Encourage the use of meditation apps for sleep support and advise setting up a designated snack area with healthy options for nocturnal eating.  Weight Management   Weight gain is associated with nocturnal eating due to insomnia. She is  actively trying to maintain her weight through exercise and dietary management. Strategies to manage nocturnal eating include setting up a designated snack area with healthier options, such as [place] yogurt or Fairlife chocolate milk.  Depressive Disorder   Her depressive disorder is managed by a psychiatric nurse practitioner. Current medications include Wellbutrin , Abilify , and Trintellix . A recent increase in Wellbutrin  from 300 mg to 450 mg was due to decreased mood, but she is hesitant to take the higher dose. No suicidal ideation is reported. Discuss with the psychiatric provider about the recent increase in Wellbutrin  and her hesitancy to take the higher dose.    Return in about 4 weeks (around 06/18/2024) for insomnia.  Jenkins CHRISTELLA Carrel, MD

## 2024-05-22 ENCOUNTER — Other Ambulatory Visit (HOSPITAL_COMMUNITY): Payer: Self-pay

## 2024-05-22 ENCOUNTER — Encounter: Payer: Self-pay | Admitting: *Deleted

## 2024-05-22 ENCOUNTER — Telehealth: Payer: Self-pay

## 2024-05-22 NOTE — Telephone Encounter (Signed)
 Patient notified of message below.

## 2024-05-22 NOTE — Telephone Encounter (Signed)
 Pharmacy Patient Advocate Encounter   Received notification from Onbase that prior authorization for Quviviq  25MG  tablets is required/requested.   Insurance verification completed.   The patient is insured through [employer] .   Per test claim: PA required; PA submitted to above mentioned insurance via CoverMyMeds Key/confirmation #/EOC [id number] Status is pending

## 2024-05-22 NOTE — Telephone Encounter (Signed)
 Pharmacy Patient Advocate Encounter  Received notification from [employer] that Prior Authorization for  Quviviq  25MG  tablets  has been APPROVED from 04/22/24 to 05/22/25   PA #/Case ID/Reference #: [id number]

## 2024-05-24 ENCOUNTER — Encounter: Payer: Self-pay | Admitting: Family Medicine

## 2024-05-25 ENCOUNTER — Other Ambulatory Visit: Payer: Self-pay | Admitting: Family Medicine

## 2024-05-25 MED ORDER — TEMAZEPAM 15 MG PO CAPS: 15.0000 mg | ORAL_CAPSULE | Freq: Every evening | ORAL | 0 refills | Status: DC | PRN

## 2024-05-25 NOTE — Telephone Encounter (Signed)
 No further action needed at this time.

## 2024-06-04 ENCOUNTER — Other Ambulatory Visit: Payer: Self-pay | Admitting: Family

## 2024-06-04 MED ORDER — QUVIVIQ 50 MG PO TABS
50.0000 mg | ORAL_TABLET | Freq: Every evening | ORAL | 3 refills | Status: DC
Start: 2024-06-04 — End: 2024-10-03

## 2024-06-04 MED ORDER — TEMAZEPAM 7.5 MG PO CAPS: 7.5000 mg | ORAL_CAPSULE | Freq: Every evening | ORAL | 1 refills | Status: DC | PRN

## 2024-06-20 ENCOUNTER — Encounter: Payer: Self-pay | Admitting: Family Medicine

## 2024-06-20 ENCOUNTER — Ambulatory Visit: Admitting: Family Medicine

## 2024-06-20 VITALS — BP 114/77 | HR 56 | Temp 97.5°F | Resp 16 | Ht 65.0 in | Wt 145.1 lb

## 2024-06-20 DIAGNOSIS — F320 Major depressive disorder, single episode, mild: Secondary | ICD-10-CM | POA: Diagnosis not present

## 2024-06-20 DIAGNOSIS — F411 Generalized anxiety disorder: Secondary | ICD-10-CM

## 2024-06-20 DIAGNOSIS — F5101 Primary insomnia: Secondary | ICD-10-CM

## 2024-06-20 MED ORDER — ARIPIPRAZOLE 15 MG PO TABS: 15.0000 mg | ORAL_TABLET | Freq: Every day | ORAL | 1 refills | Status: DC

## 2024-06-20 MED ORDER — TIZANIDINE HCL 4 MG PO TABS: 4.0000 mg | ORAL_TABLET | Freq: Three times a day (TID) | ORAL | 1 refills | Status: DC | PRN

## 2024-06-20 MED ORDER — TRINTELLIX 20 MG PO TABS: 20.0000 mg | ORAL_TABLET | Freq: Every day | ORAL | 1 refills | Status: AC

## 2024-06-20 NOTE — Patient Instructions (Signed)

## 2024-06-20 NOTE — Progress Notes (Signed)
 Subjective:     Patient ID: Cassandra Leach, female    DOB: [birth year], 53 y.o.   MRN: [id number]  Chief Complaint  Patient presents with   Medical Management of Chronic Issues    4 week follow-up No food, had coffee with almond milk    HPI Discussed the use of AI scribe software for clinical note transcription with the patient, who gave verbal consent to proceed.  History of Present Illness Cassandra Leach is a 53 year old female who presents for medication management and follow-up on insomnia treatment.  She feels stable on her current antidepressant regimen, which includes 300 mg of Wellbutrin , 20 mg of Trintellix , and 15 mg of Abilify . No suicidal thoughts are present, and her anxiety is well-managed without additional medication.  She has successfully discontinued Restoril , which she had been using for years to manage insomnia. She reports some difficulty falling asleep but is managing with Quviviq , which she has been taking  She also uses a magnesium supplement, which she feels helps with sleep.  Looking into CBT-I  She discusses her weight management, noting a current weight of 140-141 pounds, with a previous comfortable weight of 134 pounds. She attributes some weight gain to increased muscle mass from weightlifting. She maintains a healthy diet and exercise routine, including cardio and weightlifting, and has not consumed alcohol in six months.  She takes tizanidine  for neck pain, which she finds effective, and rarely needs meloxicam .   Her last blood work in February showed an A1c of 5.4 and an LDL of 116. No high blood pressure, diabetes, or heart disease.    Health Maintenance Due  Topic Date Due   Hepatitis B Vaccines (1 of 3 - 19+ 3-dose series) Never done   Pneumococcal Vaccine: 50+ Years (1 of 1 - PCV) Never done   INFLUENZA VACCINE  06/15/2024    Past Medical History:  Diagnosis Date   Anxiety    Asthma    Depression    History of migraine    No issue in 2  years   IBS (irritable bowel syndrome)    Insomnia    Mild sleep apnea    Prediabetes    SVT (supraventricular tachycardia) (HCC)    Vitamin D  deficiency     Past Surgical History:  Procedure Laterality Date   RADIOFREQUENCY ABLATION NERVES     neck     Current Outpatient Medications:    azelastine  (ASTELIN ) 0.1 % nasal spray, Place 1 spray into both nostrils 2 (two) times daily. Use in each nostril as directed, Disp: 30 mL, Rfl: 12   buPROPion  (WELLBUTRIN  XL) 300 MG 24 hr tablet, Take 300 mg by mouth daily., Disp: , Rfl:    Cholecalciferol (VITAMIN D3) 5000 units CAPS, Take 1 capsule by mouth daily., Disp: , Rfl:    Daridorexant  HCl (QUVIVIQ ) 50 MG TABS, Take 1 tablet (50 mg total) by mouth at bedtime., Disp: 30 tablet, Rfl: 3   meloxicam  (MOBIC ) 15 MG tablet, TAKE 1 TABLET(15 MG) BY MOUTH DAILY, Disp: 90 tablet, Rfl: 1   nebivolol  (BYSTOLIC ) 5 MG tablet, Take 1 tablet (5 mg total) by mouth daily., Disp: 90 tablet, Rfl: 0   ARIPiprazole  (ABILIFY ) 15 MG tablet, Take 1 tablet (15 mg total) by mouth daily., Disp: 90 tablet, Rfl: 1   tiZANidine  (ZANAFLEX ) 4 MG tablet, Take 1 tablet (4 mg total) by mouth every 8 (eight) hours as needed for muscle spasms., Disp: 90 tablet, Rfl: 1   TRINTELLIX   20 MG TABS tablet, Take 1 tablet (20 mg total) by mouth daily., Disp: 90 tablet, Rfl: 1  Allergies  Allergen Reactions   Bacitracin Dermatitis   Polymyxin B Dermatitis   Neomycin Dermatitis   Neosporin [Bacitracin-Polymyxin B] Rash   Penicillins Nausea And Vomiting and Nausea Only   Sulfa Antibiotics Nausea And Vomiting and Nausea Only   ROS neg/noncontributory except as noted HPI/below      Objective:     BP 114/77   Pulse (!) 56   Temp (!) 97.5 F (36.4 C) (Temporal)   Resp 16   Ht 5' 5 (1.651 m)   Wt 145 lb 2 oz (65.8 kg)   SpO2 100%   BMI 24.15 kg/m  Wt Readings from Last 3 Encounters:  06/20/24 145 lb 2 oz (65.8 kg)  05/21/24 144 lb 6 oz (65.5 kg)  12/27/23 139 lb 2 oz  (63.1 kg)    Physical Exam   Gen: WDWN NAD HEENT: NCAT, conjunctiva not injected, sclera nonicteric NECK:  supple, no thyromegaly, no nodes, no carotid bruits CARDIAC: RRR, S1S2+, no murmur. LUNGS: CTAB. No wheezes MSK: no gross abnormalities.  NEURO: A&O x3.  CN II-XII intact.  PSYCH: normal mood. Good eye contact     Assessment & Plan:  Generalized anxiety disorder -     Trintellix ; Take 1 tablet (20 mg total) by mouth daily.  Dispense: 90 tablet; Refill: 1 -     ARIPiprazole ; Take 1 tablet (15 mg total) by mouth daily.  Dispense: 90 tablet; Refill: 1  Depression, major, single episode, mild (HCC) -     Trintellix ; Take 1 tablet (20 mg total) by mouth daily.  Dispense: 90 tablet; Refill: 1 -     ARIPiprazole ; Take 1 tablet (15 mg total) by mouth daily.  Dispense: 90 tablet; Refill: 1  Primary insomnia  Other orders -     tiZANidine  HCl; Take 1 tablet (4 mg total) by mouth every 8 (eight) hours as needed for muscle spasms.  Dispense: 90 tablet; Refill: 1  Assessment and Plan Assessment & Plan Depression and anxiety disorder   Depression and anxiety are well-controlled with the current medication regimen, as evidenced by excellent PHQ-9 and GAD-7 scores. No suicidal ideation is reported, but she lacks confidence in her current prescriber. Prescribe Trintellix  20 mg and Abilify  15 mg, sending prescriptions to [facility] in [place], and provide a six-month supply. Advise seeking a psychiatrist for long-term management, especially if symptoms worsen.  Insomnia   She has successfully discontinued Restoril  and is using Quviviq  50 mg for insomnia, reporting some difficulty falling asleep but overall improvement. Consider Cognitive Behavioral Therapy for Insomnia (CBT-I) as an adjunct treatment. Continue Quviviq  50 mg and advise trying CBT-I apps with a one-week free trial to assess effectiveness. Encourage continued use of magnesium supplements for potential sleep benefits.  Muscle  spasm of neck region   Muscle spasms in the neck are well-managed with tizanidine , with significant improvement and minimal need for meloxicam . Refill tizanidine  prescription and advise continuing meloxicam  as needed.  Overweight   Weight is stable around 140-141 lbs, with a desire to return to 134 lbs. She engages in regular exercise, including weight lifting, and maintains a healthy diet with no alcohol consumption for six months. Encourage continued healthy eating habits and regular exercise. Advise incorporating a healthy nighttime snack to prevent late-night eating and reinforce the importance of maintaining current lifestyle changes for weight management.    Return in about 6 months (around 12/21/2024) for  annual physical.  Jenkins CHRISTELLA Carrel, MD

## 2024-06-21 ENCOUNTER — Ambulatory Visit: Admitting: Family Medicine

## 2024-07-06 ENCOUNTER — Ambulatory Visit (HOSPITAL_BASED_OUTPATIENT_CLINIC_OR_DEPARTMENT_OTHER): Admitting: Family

## 2024-07-24 ENCOUNTER — Encounter (HOSPITAL_BASED_OUTPATIENT_CLINIC_OR_DEPARTMENT_OTHER): Payer: Self-pay

## 2024-08-13 ENCOUNTER — Other Ambulatory Visit (HOSPITAL_COMMUNITY): Payer: Self-pay

## 2024-08-13 ENCOUNTER — Encounter (HOSPITAL_BASED_OUTPATIENT_CLINIC_OR_DEPARTMENT_OTHER): Payer: Self-pay | Admitting: Family

## 2024-08-13 ENCOUNTER — Ambulatory Visit (INDEPENDENT_AMBULATORY_CARE_PROVIDER_SITE_OTHER): Admitting: Family

## 2024-08-13 ENCOUNTER — Telehealth: Payer: Self-pay | Admitting: Pharmacy Technician

## 2024-08-13 VITALS — BP 111/67 | HR 50 | Ht 65.0 in | Wt 153.5 lb

## 2024-08-13 DIAGNOSIS — R001 Bradycardia, unspecified: Secondary | ICD-10-CM

## 2024-08-13 DIAGNOSIS — I471 Supraventricular tachycardia, unspecified: Secondary | ICD-10-CM | POA: Diagnosis not present

## 2024-08-13 MED ORDER — NEBIVOLOL HCL 5 MG PO TABS: 5.0000 mg | ORAL_TABLET | Freq: Every day | ORAL | 3 refills | Status: AC

## 2024-08-13 NOTE — Telephone Encounter (Signed)
 Pharmacy Patient Advocate Encounter   Received notification from Physician's Office that prior authorization for Nebvolol 5MG  is required/requested.   Insurance verification completed.   The patient is insured through [employer] .   Per test claim: Refill too soon. PA is not needed at this time. Medication was filled 08/13/24. Next eligible fill date is 10/19/24.

## 2024-08-13 NOTE — Progress Notes (Signed)
 Cardiology Office Note   Date:  08/13/2024  ID:  Cassandra Leach, DOB [birth year], MRN [id number] PCP: Wendolyn Jenkins Jansky, MD  [facility] Providers Cardiologist:  None Cardiology APP:  Vannie Reche RAMAN, NP     History of Present Illness Cassandra Leach is a 53 y.o. female with history of anxiety, depression, aspirin, SVT.  Initially established with Dr. Maranda for management of SVT.  Initial episode required ED visit while living in [place].  Previously did not tolerate metoprolol nor diltiazem.  TTE 2017 LVEF 60 to 65%, mild MR.  Has since established with Dr. Hobart.  She has been maintained on Nebivolol  2.5 mg daily which was later increased to 5 mg daily for breakthrough symptoms.  Presents today for follow-up independently. Reports no breakthrough tachycardia.  Since last seen she was able to stop Restoril   as she had a goal to be off benzodiapines. She is now on Quviviq  and sleeping well.  She was previously diagnosed with sleep apnea and did not tolerate CPAP nor dental device.  She notes she no longer snores after losing 90 pounds. She exercises by riding bike and walking. She has resumed a [employer] membership. She has been doing a strength course with [employer] instructors once per week. Eats at home and cooks. She does not routinely follow a low sodium diet. Previously has had a tendency toward hypotension.   Reports no shortness of breath nor dyspnea on exertion. Reports no chest pain, pressure, or tightness. No edema, orthopnea, PND. Reports no palpitations.  No lightheadedness, dizziness, near-syncope, syncope.  The 10-year ASCVD risk score (Arnett DK, et al., 2019) is: 1.8%   Values used to calculate the score:     Age: 65 years     Clincally relevant sex: Female     Is Non-Hispanic African American: No     Diabetic: No     Tobacco smoker: No     Systolic Blood Pressure: 111 mmHg     Is BP treated: Yes     HDL Cholesterol: 57.6 mg/dL     Total Cholesterol: 184  mg/dL   ROS: Please see the history of present illness.    All other systems reviewed and are negative.   Studies Reviewed EKG Interpretation Date/Time:  Monday August 13 2024 15:10:49 EDT Ventricular Rate:  50 PR Interval:  144 QRS Duration:  92 QT Interval:  428 QTC Calculation: 390 R Axis:   -2  Text Interpretation: Sinus bradycardia No previous ECGs available Confirmed by Vannie Reche ([id number]) on 08/13/2024 3:32:54 PM        Risk Assessment/Calculations           Physical Exam VS:  BP 111/67 (BP Location: Left Arm, Patient Position: Sitting, Cuff Size: Normal)   Pulse (!) 50   Ht 5' 5 (1.651 m)   Wt 153 lb 8 oz (69.6 kg)   SpO2 99%   BMI 25.54 kg/m        Wt Readings from Last 3 Encounters:  08/13/24 153 lb 8 oz (69.6 kg)  06/20/24 145 lb 2 oz (65.8 kg)  05/21/24 144 lb 6 oz (65.5 kg)    GEN: Well nourished, well developed in no acute distress NECK: No JVD; No carotid bruits CARDIAC: RRR, no murmurs, rubs, gallops RESPIRATORY:  Clear to auscultation without rales, wheezing or rhonchi  ABDOMEN: Soft, non-tender, non-distended EXTREMITIES:  No edema; No deformity   ASSESSMENT AND PLAN  SVT -quiescent on Nebivolol  5 mg daily, continue same.  Refill provided.  Of note previously did not tolerate metoprolol nor diltiazem.  Bradycardia - asymtpomatic with no lightheadedness, dizziness, syncope.  Likely due to her Nebivolol  but as she states her symptoms are well-controlled we will continue same dose.  If she develops symptoms of bradycardia consider reducing dose and/or ZIO monitor.       Dispo: follow up in 1 year  Signed, Reche GORMAN Finder, NP

## 2024-08-13 NOTE — Patient Instructions (Signed)
 Medication Instructions:   Your physician recommends that you continue on your current medications as directed. Please refer to the Current Medication list given to you today.  *If you need a refill on your cardiac medications before your next appointment, please call your pharmacy*    Follow-Up: At [facility], you and your health needs are our priority.  As part of our continuing mission to provide you with exceptional heart care, our providers are all part of one team.  This team includes your primary Cardiologist (physician) and Advanced Practice Providers or APPs (Physician Assistants and Nurse Practitioners) who all work together to provide you with the care you need, when you need it.  Your next appointment:   1 year(s)  Provider:   Reche Finder, NP

## 2024-08-28 ENCOUNTER — Ambulatory Visit: Admitting: Nurse Practitioner

## 2024-10-03 ENCOUNTER — Encounter: Payer: Self-pay | Admitting: Family Medicine

## 2024-10-03 ENCOUNTER — Other Ambulatory Visit: Payer: Self-pay

## 2024-10-03 MED ORDER — QUVIVIQ 50 MG PO TABS: 50.0000 mg | ORAL_TABLET | Freq: Every evening | ORAL | Status: DC

## 2024-10-03 NOTE — Telephone Encounter (Signed)
 Sent in

## 2024-10-05 ENCOUNTER — Other Ambulatory Visit: Payer: Self-pay | Admitting: Family

## 2024-10-08 ENCOUNTER — Other Ambulatory Visit: Payer: Self-pay | Admitting: Family Medicine

## 2024-10-08 MED ORDER — QUVIVIQ 50 MG PO TABS: 50.0000 mg | ORAL_TABLET | Freq: Every evening | ORAL | 0 refills | Status: DC

## 2024-10-08 NOTE — Telephone Encounter (Unsigned)
 Copied from CRM #[id number]. Topic: Clinical - Medication Refill >> Oct 08, 2024 11:13 AM Ahlexyia S wrote: Medication: Daridorexant  HCl (QUVIVIQ ) 50 MG TABS  Has the patient contacted their pharmacy? Yes, pharmacy was advised to contact us . (Agent: If no, request that the patient contact the pharmacy for the refill. If patient does not wish to contact the pharmacy document the reason why and proceed with request.) (Agent: If yes, when and what did the pharmacy advise?)  This is the patient's preferred pharmacy:  [id number] - [place], [place] - [id number] [place] [address] Phone: [phone] Fax: [phone]  Is this the correct pharmacy for this prescription? Yes If no, delete pharmacy and type the correct one.   Has the prescription been filled recently? No  Is the patient out of the medication? No, very limited supply. Pt has 2 days left.  Has the patient been seen for an appointment in the last year OR does the patient have an upcoming appointment? Yes  Can we respond through MyChart? Yes  Agent: Please be advised that Rx refills may take up to 3 business days. We ask that you follow-up with your pharmacy.

## 2024-10-15 ENCOUNTER — Ambulatory Visit: Admitting: Family Medicine

## 2024-10-15 VITALS — BP 120/78 | HR 63 | Temp 97.8°F | Ht 65.0 in | Wt 157.8 lb

## 2024-10-15 DIAGNOSIS — G2581 Restless legs syndrome: Secondary | ICD-10-CM | POA: Diagnosis not present

## 2024-10-15 DIAGNOSIS — Z23 Encounter for immunization: Secondary | ICD-10-CM

## 2024-10-15 DIAGNOSIS — M542 Cervicalgia: Secondary | ICD-10-CM

## 2024-10-15 DIAGNOSIS — F5101 Primary insomnia: Secondary | ICD-10-CM

## 2024-10-15 MED ORDER — QUVIVIQ 50 MG PO TABS: 50.0000 mg | ORAL_TABLET | Freq: Every evening | ORAL | 5 refills | Status: AC

## 2024-10-15 MED ORDER — TIZANIDINE HCL 4 MG PO TABS: 4.0000 mg | ORAL_TABLET | Freq: Three times a day (TID) | ORAL | 1 refills | Status: AC | PRN

## 2024-10-15 NOTE — Progress Notes (Signed)
   Cassandra Leach is a 53 y.o. female who presents today for an office visit.  Assessment/Plan:  Restless legs No red flags.  This has been an ongoing issue for many years intermittent in nature though worsened recently.  Will check labs today including iron panel, CBC, c-Met, B12, vitamin D , and folate.  Anticipate that she will likely need iron repletion and may need IV iron infusion depending on results.  We did discuss trial of medication however we are checking above labs first.  May consider trial of gabapentin  or ropinirole depending on above results.  Insomnia She has chronic insomnia.  She is on Quiviviq.  She did have a refill sedated recently however needs refill sent in with refills.  PCP is out of office this week.  Will send in a refill though defer further management to her PCP.  Chronic neck pain Overall stable on tizanidine  as needed.  Will refill today.  Flu shot given today.     Subjective:  HPI:  See assessment / plan for status of chronic conditions.     Discussed the use of AI scribe software for clinical note transcription with the patient, who gave verbal consent to proceed.  History of Present Illness Cassandra Leach is a 53 year old female who presents with worsening restless legs syndrome and medication refills.  She experiences persistent restless legs syndrome, particularly affecting her at night. She describes a sensation of needing to move her legs, which starts a couple of hours before bedtime and interferes with her ability to fall asleep. She has tried massaging her legs and using a heating pad, which provides minimal relief. Her husband also assists by massaging her legs.  Years ago, she discussed similar symptoms with a doctor who suggested a vitamin D  deficiency. Since then, she has been taking a vitamin D  supplement. However, she has not been on any specific medication for restless legs syndrome and prefers to avoid medication due to concerns about  side effects and interactions with her current medications.  She is currently taking Qviviq for sleep, which was refilled last week but without additional refills. She also mentions taking Xanathlex, which needs a refill.  She reports a weight gain of about 22 pounds, which she attributes to possible menopause, as she is six months into not having her period. She also mentions a history of SIBO, for which she took Xifaxan, paying out of pocket due to insurance issues.  During the review of symptoms, she notes difficulty sleeping due to the restless legs and weight gain. No use of multivitamins or other supplements aside from vitamin D .         Objective:  Physical Exam: BP 120/78   Pulse 63   Temp 97.8 F (36.6 C) (Temporal)   Ht 5' 5 (1.651 m)   Wt 157 lb 12.8 oz (71.6 kg)   LMP 04/09/2024 (Approximate)   SpO2 98%   BMI 26.26 kg/m   Gen: No acute distress, resting comfortably Neuro: Grossly normal, moves all extremities Psych: Normal affect and thought content      Nalla Purdy M. Kennyth, MD 10/15/2024 2:42 PM

## 2024-10-15 NOTE — Patient Instructions (Signed)
 It was very nice to see you today!  VISIT SUMMARY: During your visit, we discussed your worsening restless legs syndrome, insomnia, weight gain, and potential menopause. We also administered your flu shot and ordered blood work to check various levels, including thyroid  function and ferritin.  YOUR PLAN: RESTLESS LEGS SYNDROME: You have chronic restless legs syndrome that worsens at night, affecting your sleep. -We ordered blood work to check your ferritin, vitamin D , B12, and folate levels. -If your ferritin levels are low, we will consider an IV iron infusion or discuss oral iron supplementation.  INSOMNIA: Your chronic insomnia is made worse by your restless legs syndrome. -We refilled your Qviviq prescription with additional refills.  WEIGHT GAIN AND POTENTIAL MENOPAUSE: You have experienced a weight gain of about 22 pounds, which may be related to menopause. -We ordered blood work, including thyroid  function tests, to investigate further.  IMMUNIZATION: You were due for a flu shot. -We administered your flu shot today.  No follow-ups on file.   Take care, Dr Kennyth  PLEASE NOTE:  If you had any lab tests, please let us  know if you have not heard back within a few days. You may see your results on mychart before we have a chance to review them but we will give you a call once they are reviewed by us .   If we ordered any referrals today, please let us  know if you have not heard from their office within the next week.   If you had any urgent prescriptions sent in today, please check with the pharmacy within an hour of our visit to make sure the prescription was transmitted appropriately.   Please try these tips to maintain a healthy lifestyle:  Eat at least 3 REAL meals and 1-2 snacks per day.  Aim for no more than 5 hours between eating.  If you eat breakfast, please do so within one hour of getting up.   Each meal should contain half fruits/vegetables, one quarter protein, and  one quarter carbs (no bigger than a computer mouse)  Cut down on sweet beverages. This includes juice, soda, and sweet tea.   Drink at least 1 glass of water with each meal and aim for at least 8 glasses per day  Exercise at least 150 minutes every week.

## 2024-10-16 ENCOUNTER — Ambulatory Visit: Payer: Self-pay | Admitting: Family Medicine

## 2024-10-16 LAB — VITAMIN D 25 HYDROXY (VIT D DEFICIENCY, FRACTURES): VITD: 53.8 ng/mL (ref 30.00–100.00)

## 2024-10-16 LAB — IBC + FERRITIN
Ferritin: 66.5 ng/mL (ref 10.0–291.0)
Iron: 97 ug/dL (ref 42–145)
Saturation Ratios: 27.6 % (ref 20.0–50.0)
TIBC: 351.4 ug/dL (ref 250.0–450.0)
Transferrin: 251 mg/dL (ref 212.0–360.0)

## 2024-10-16 LAB — CBC
HCT: 40.6 % (ref 36.0–46.0)
Hemoglobin: 13.7 g/dL (ref 12.0–15.0)
MCHC: 33.6 g/dL (ref 30.0–36.0)
MCV: 90.3 fl (ref 78.0–100.0)
Platelets: 250 K/uL (ref 150.0–400.0)
RBC: 4.5 Mil/uL (ref 3.87–5.11)
RDW: 13.4 % (ref 11.5–15.5)
WBC: 5.7 K/uL (ref 4.0–10.5)

## 2024-10-16 LAB — COMPREHENSIVE METABOLIC PANEL WITH GFR
ALT: 19 U/L (ref 0–35)
AST: 21 U/L (ref 0–37)
Albumin: 4.6 g/dL (ref 3.5–5.2)
Alkaline Phosphatase: 32 U/L — ABNORMAL LOW (ref 39–117)
BUN: 22 mg/dL (ref 6–23)
CO2: 30 meq/L (ref 19–32)
Calcium: 9.3 mg/dL (ref 8.4–10.5)
Chloride: 103 meq/L (ref 96–112)
Creatinine, Ser: 0.72 mg/dL (ref 0.40–1.20)
GFR: 93.98 mL/min (ref >60.00)
Glucose, Bld: 86 mg/dL (ref 70–99)
Potassium: 4.2 meq/L (ref 3.5–5.1)
Sodium: 138 meq/L (ref 135–145)
Total Bilirubin: 0.4 mg/dL (ref 0.2–1.2)
Total Protein: 7.3 g/dL (ref 6.0–8.3)

## 2024-10-16 LAB — TSH: TSH: 2.86 u[IU]/mL (ref 0.35–5.50)

## 2024-10-16 LAB — VITAMIN B12: Vitamin B-12: 458 pg/mL (ref 211–911)

## 2024-10-16 NOTE — Progress Notes (Signed)
 Her ferritin is in the lower range of normal.  As we discussed at her office visit we should try to get this to the 75-100 range to see if this will improve her symptoms.  She can try taking over-the-counter iron supplementation ferrous sulfate 65 mg every other day or we can try to set her up for an iron infusion if she prefers this.  All of her other labs are stable.

## 2024-10-17 NOTE — Telephone Encounter (Signed)
 Patient notified of Dr. Carroll note via MyChart

## 2024-10-17 NOTE — Telephone Encounter (Signed)
 I would recommend trying for at least a month. We can recheck levels in 1-3 months.

## 2024-11-27 ENCOUNTER — Encounter: Payer: Self-pay | Admitting: Family Medicine

## 2024-11-27 ENCOUNTER — Ambulatory Visit: Admitting: Family Medicine

## 2024-11-27 VITALS — BP 108/66 | HR 62 | Temp 97.0°F | Ht 65.0 in | Wt 158.5 lb

## 2024-11-27 DIAGNOSIS — M79604 Pain in right leg: Secondary | ICD-10-CM | POA: Diagnosis not present

## 2024-11-27 DIAGNOSIS — M79605 Pain in left leg: Secondary | ICD-10-CM

## 2024-11-27 DIAGNOSIS — F5101 Primary insomnia: Secondary | ICD-10-CM

## 2024-11-27 DIAGNOSIS — G2581 Restless legs syndrome: Secondary | ICD-10-CM

## 2024-11-27 MED ORDER — MELOXICAM 15 MG PO TABS: 15.0000 mg | ORAL_TABLET | Freq: Every day | ORAL | 1 refills | Status: AC | PRN

## 2024-11-27 MED ORDER — GABAPENTIN 100 MG PO CAPS: 100.0000 mg | ORAL_CAPSULE | Freq: Every day | ORAL | 1 refills | Status: AC

## 2024-11-27 NOTE — Progress Notes (Signed)
 "  Subjective:     Patient ID: Cassandra Leach, female    DOB: 17-Nov-1970, 54 y.o.   MRN: [id number]  Chief Complaint  Patient presents with   Insomnia    Pt can't fall asleep takes hours     Discussed the use of AI scribe software for clinical note transcription with the patient, who gave verbal consent to proceed. She has a history of using Ambien  and Lunesta, which did not significantly improve her sleep and caused nocturnal eating with amnesia. She also tried trazodone  in the past but does not recall its effects. And restoril  History of Present Illness Cassandra Leach is a 54 year old female with chronic insomnia and weight gain who presents with insomnia and weight gain.  She experiences significant insomnia and reports difficulty falling asleep despite taking her current medication, Quviviq . She has a history of trying multiple medications for sleep, including Ambien , Lunesta, trazodone , and Restoril , all of which either caused side effects or lost efficacy. Valium , previously prescribed for anxiety, helped her sleep, but she was switched to Restoril , which she used for years before discontinuing. Her sleep pattern involves taking Quviviq  at bedtime, but she remains unable to fall asleep until after 1 AM, despite taking additional medications like progesterone, a muscle relaxant, and meloxicam . Once asleep, she stays asleep, but the difficulty in falling asleep persists. She has a history of insomnia for over a decade and has tried cognitive behavioral therapy for insomnia without significant benefit. She has also been on hormone therapy, which she believes has positively impacted her mood.  Working on weaning other meds  She has gained approximately 28 pounds since the spring, increasing from 134 pounds to 162 pounds. She attributes this weight gain to being 'hungry all the time' and notes that her thoughts are often consumed by food. She recently started Zepbound, an [employer] medication, to  address her weight gain and reports a decrease in hunger since her first injection on January 9th. Despite the weight gain, she maintains a daily exercise routine.  She experiences leg pain, particularly in her hamstrings, describing them as having 'knots' and being very painful. She uses a Theragun for relief and takes a muscle relaxant and meloxicam  to manage the pain. The pain is exacerbated by certain exercises, such as hamstring curls, which she performs on her [employer] home gym.  Her medication regimen includes Quviviq  for sleep, progesterone, meloxicam , and a muscle relaxant. She also takes magnesium and iron supplements, which have helped with her restless legs. She is in the process of tapering off Abilify , currently at 2.5 mg, and wants to discontinue it completely after 15 years of use. She is also on Trintellix  and Wellbutrin  for mood stabilization. No worsening of symptoms with the initiation of Zepbound. Her restless legs have improved with iron supplementation.    Health Maintenance Due  Topic Date Due   Cervical Cancer Screening (HPV/Pap Cotest)  11/12/2024    Past Medical History:  Diagnosis Date   Anxiety    Asthma    Depression    History of migraine    No issue in 2 years   IBS (irritable bowel syndrome)    Insomnia    Mild sleep apnea    Prediabetes    SVT (supraventricular tachycardia)    Vitamin D  deficiency     Past Surgical History:  Procedure Laterality Date   RADIOFREQUENCY ABLATION NERVES     neck    Current Medications[1]  Allergies[2] ROS neg/noncontributory  except as noted HPI/below      Objective:     BP 108/66 (BP Location: Left Arm, Patient Position: Sitting, Cuff Size: Normal)   Pulse 62   Temp (!) 97 F (36.1 C) (Temporal)   Ht 5' 5 (1.651 m)   Wt 158 lb 8 oz (71.9 kg)   SpO2 98%   BMI 26.38 kg/m  Wt Readings from Last 3 Encounters:  11/27/24 158 lb 8 oz (71.9 kg)  10/15/24 157 lb 12.8 oz (71.6 kg)  08/13/24 153 lb 8 oz (69.6  kg)    Physical Exam GENERAL: Well developed, well nourished, no acute distress. HEAD EYES EARS NOSE THROAT: Normocephalic, atraumatic, conjunctiva not injected, sclera nonicteric. CARDIAC: Regular rate and rhythm, S1 S2 present, EXTREMITIES: No edema. MUSCULOSKELETAL: No gross abnormalities, hamstrings tender, no palpable lumps. NEUROLOGICAL: Alert and oriented x3, cranial nerves II through XII intact. PSYCHIATRIC: Normal mood, good eye contact.      Reviewed labs Assessment & Plan:  Primary insomnia  Pain in both lower extremities  Restless legs  Other orders -     Gabapentin ; Take 1 capsule (100 mg total) by mouth at bedtime.  Dispense: 30 capsule; Refill: 1 -     Meloxicam ; Take 1 tablet (15 mg total) by mouth daily as needed for pain.  Dispense: 90 tablet; Refill: 1    Assessment and Plan Assessment & Plan Primary insomnia   She experiences chronic insomnia with difficulty initiating sleep despite Quviviq . Previous trials of Ambien , Lunesta, trazodone , and Restoril  were ineffective or caused side effects. Valium  was effective but not suitable for long-term use. Cognitive behavioral therapy for insomnia was ineffective. . Gabapentin  is considered for potential benefits on sleep and leg pain. A reduction in Abilify  may have impacted sleep quality. Adjust Quviviq  administration to 7-8 PM to assess its impact on sleep onset. Initiate gabapentin  100 mg for 3-5 nights, with potential to increase the dose based on response. Maintain the current Abilify  dose at 2.5 mg until sleep stability is achieved. Refilled meloxicam  prescription at [facility] in [place].  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 Pain management includes a muscle relaxant and meloxicam . Gabapentin  is considered for additional pain relief. She uses a Theragun for muscle relaxation. Initiate gabapentin  100 mg for one week, with  potential to increase the dose based on response. Advised to reduce exercise intensity to alleviate hamstring pain. Continue use of Theragun for muscle relaxation.  Obesity   She has gained 28 pounds since spring, currently weighing 162 pounds. Started Zepbound for weight management, with initial improvement in appetite control. Continues regular exercise regimen. Continue Zepbound for weight management and maintain regular exercise regimen.  General health maintenance   Routine follow-up and blood work are scheduled. A follow-up appointment is scheduled in one month for a physical and blood work.     Return for as sch 2/12.  Jenkins CHRISTELLA Carrel, MD     [1]  Current Outpatient Medications:    ABILIFY  5 MG tablet, 5 mg daily., Disp: , Rfl:    buPROPion  (WELLBUTRIN  XL) 150 MG 24 hr tablet, Take 150 mg by mouth daily., Disp: , Rfl:    Cholecalciferol (VITAMIN D3) 5000 units CAPS, Take 1 capsule by mouth daily., Disp: , Rfl:    Daridorexant  HCl (QUVIVIQ ) 50 MG TABS, Take 1 tablet (50 mg total) by mouth at bedtime., Disp: 30 tablet, Rfl: 5   estradiol (VIVELLE-DOT) 0.025 MG/24HR,  APPLY 1 PATCH TOPICALLY TO THE SKIN 2 TIMES A WEEK, Disp: , Rfl:    gabapentin  (NEURONTIN ) 100 MG capsule, Take 1 capsule (100 mg total) by mouth at bedtime., Disp: 30 capsule, Rfl: 1   nebivolol  (BYSTOLIC ) 5 MG tablet, Take 1 tablet (5 mg total) by mouth daily., Disp: 90 tablet, Rfl: 3   progesterone (PROMETRIUM) 100 MG capsule, TAKE 1 CAPSULE BY MOUTH EVERY DAY AT BEDTIME, Disp: , Rfl:    Testosterone 10 MG/ACT (2%) GEL, Place onto the skin., Disp: , Rfl:    Tirzepatide-Weight Management (ZEPBOUND Tift), Inject into the skin., Disp: , Rfl:    tiZANidine  (ZANAFLEX ) 4 MG tablet, Take 1 tablet (4 mg total) by mouth every 8 (eight) hours as needed for muscle spasms., Disp: 90 tablet, Rfl: 1   TRINTELLIX  20 MG TABS tablet, Take 1 tablet (20 mg total) by mouth daily., Disp: 90 tablet, Rfl: 1   meloxicam  (MOBIC ) 15 MG tablet,  Take 1 tablet (15 mg total) by mouth daily as needed for pain., Disp: 90 tablet, Rfl: 1 [2]  Allergies Allergen Reactions   Bacitracin Dermatitis   Polymyxin B Dermatitis and Other (See Comments)    polymyxin B sulfate   Neomycin Dermatitis   Bacitracin-Polymyxin B Rash and Dermatitis   Penicillins Nausea And Vomiting and Nausea Only   Sulfa Antibiotics Nausea And Vomiting and Nausea Only   "

## 2024-11-27 NOTE — Patient Instructions (Addendum)
 Take qviviq earlier.   Take gabapentin  1 at bed for 3-5 days, and if needed can increase to 2 and in few days, can increase to 3 if needed.   Let me know

## 2024-12-04 ENCOUNTER — Other Ambulatory Visit: Payer: Self-pay | Admitting: Family Medicine

## 2024-12-04 ENCOUNTER — Encounter: Payer: Self-pay | Admitting: Family Medicine

## 2024-12-04 MED ORDER — CYCLOBENZAPRINE HCL 10 MG PO TABS: 10.0000 mg | ORAL_TABLET | Freq: Every evening | ORAL | 0 refills | Status: AC | PRN

## 2024-12-11 ENCOUNTER — Telehealth: Payer: Self-pay | Admitting: Family

## 2024-12-11 NOTE — Telephone Encounter (Signed)
 Patient c/o Palpitations: STAT if patient c/o lightheadedness, shortness of breath, or chest pain  How long have you had palpitations/irregular HR/ Afib? Are you having the symptoms now?  Palpitations + elevated HR with minimal exertion. No symptoms currently because she is at rest  Are you currently experiencing lightheadedness, SOB or CP?  No   Do you have a history of afib (atrial fibrillation) or irregular heart rhythm?  Yes   Have you checked your BP or HR? (document readings if available):  BP is normal, around 120/80. HR is 80 currently, but got up to 125 bpm when she was walking upstairs. Patient says this is not normal for her  Are you experiencing any other symptoms?  No

## 2024-12-11 NOTE — Telephone Encounter (Signed)
 Spoke with patient who stated that she started Zepbound about 3 weeks ago  Resting HR used to be 60's, now 80's HR 125 when walked up steps, doesn't usually notice elevated HR but watch alerted her  Recovers quickly  Rides stationary bike, normally HR 90's and now 110's   No change in caffeine  intake however she does drink a lot No way of checking blood pressure at home   Read Zepbound could increase HR   Discussed with Reche ORN NP who reviewed chart Make sure patient is staying hydrated  Heart rate not in a dangerous zone  No room for increasing Bystolic  at this time   Discussed with patient and advised to make sure staying hydrated and decrease caffeine  intake  Continue to monitor and update on how she is doing in about a week or so  If able to capture EKG on watch send in via mychart for review

## 2024-12-27 ENCOUNTER — Encounter: Admitting: Family Medicine
